# Patient Record
Sex: Female | Born: 1965 | Race: White | Hispanic: No | Marital: Married | Smoking: Current every day smoker
Health system: Southern US, Community
[De-identification: ages and names within clinical notes are randomized; demographics above are authoritative.]

## PROBLEM LIST (undated history)

## (undated) ENCOUNTER — Emergency Department (HOSPITAL_COMMUNITY): Admission: EM | Payer: 59 | Source: Home / Self Care

## (undated) DIAGNOSIS — I10 Essential (primary) hypertension: Secondary | ICD-10-CM

## (undated) DIAGNOSIS — M1990 Unspecified osteoarthritis, unspecified site: Secondary | ICD-10-CM

## (undated) DIAGNOSIS — T7840XA Allergy, unspecified, initial encounter: Secondary | ICD-10-CM

## (undated) HISTORY — PX: ABDOMINAL HYSTERECTOMY: SHX81

## (undated) HISTORY — DX: Essential (primary) hypertension: I10

## (undated) HISTORY — PX: TOTAL ABDOMINAL HYSTERECTOMY: SHX209

## (undated) HISTORY — DX: Allergy, unspecified, initial encounter: T78.40XA

## (undated) HISTORY — PX: CARDIAC CATHETERIZATION: SHX172

## (undated) HISTORY — DX: Unspecified osteoarthritis, unspecified site: M19.90

---

## 1998-09-12 ENCOUNTER — Other Ambulatory Visit: Admission: RE | Admit: 1998-09-12 | Discharge: 1998-09-12 | Payer: Self-pay | Admitting: Gynecology

## 2000-08-04 ENCOUNTER — Encounter: Admission: RE | Admit: 2000-08-04 | Discharge: 2000-08-04 | Payer: Self-pay | Admitting: Internal Medicine

## 2000-08-04 ENCOUNTER — Encounter: Payer: Self-pay | Admitting: Internal Medicine

## 2011-11-10 ENCOUNTER — Other Ambulatory Visit: Payer: Self-pay | Admitting: Physician Assistant

## 2011-11-10 MED ORDER — VENLAFAXINE HCL 75 MG PO TABS: ORAL_TABLET | ORAL | Status: DC

## 2011-12-17 ENCOUNTER — Ambulatory Visit (INDEPENDENT_AMBULATORY_CARE_PROVIDER_SITE_OTHER): Payer: 59 | Admitting: Family Medicine

## 2011-12-17 VITALS — BP 122/78 | HR 75 | Temp 98.5°F | Resp 16 | Ht 63.25 in | Wt 176.0 lb

## 2011-12-17 DIAGNOSIS — F3289 Other specified depressive episodes: Secondary | ICD-10-CM

## 2011-12-17 DIAGNOSIS — F329 Major depressive disorder, single episode, unspecified: Secondary | ICD-10-CM

## 2011-12-17 DIAGNOSIS — F32A Depression, unspecified: Secondary | ICD-10-CM | POA: Insufficient documentation

## 2011-12-17 MED ORDER — VENLAFAXINE HCL 75 MG PO TABS: ORAL_TABLET | ORAL | Status: DC

## 2011-12-17 NOTE — Progress Notes (Signed)
  Subjective:    Patient ID: Rachel Roth, female    DOB: [birth year], 46 y.o.   MRN: [id number]  HPI 46 yo female with depression who needs refill on effexor.  On for several years.  Doing well.  Has tried other meds and didn't do well.    Otherwise doing well.  No other complaints.    Review of Systems Negative except as per HPI     Objective:   Physical Exam  Constitutional: She appears well-developed and well-nourished.  Cardiovascular: Normal rate, regular rhythm, normal heart sounds and intact distal pulses.   No murmur heard. Pulmonary/Chest: Effort normal and breath sounds normal.  Neurological: She is alert.  Skin: Skin is warm and dry.          Assessment & Plan:  Depression - refilled effexor for 6 months.

## 2012-05-28 ENCOUNTER — Ambulatory Visit: Payer: 59

## 2012-05-28 ENCOUNTER — Ambulatory Visit (INDEPENDENT_AMBULATORY_CARE_PROVIDER_SITE_OTHER): Payer: 59 | Admitting: Internal Medicine

## 2012-05-28 VITALS — BP 135/83 | HR 76 | Temp 98.3°F | Resp 16 | Ht 62.58 in | Wt 174.8 lb

## 2012-05-28 DIAGNOSIS — M2550 Pain in unspecified joint: Secondary | ICD-10-CM

## 2012-05-28 DIAGNOSIS — R51 Headache: Secondary | ICD-10-CM

## 2012-05-28 DIAGNOSIS — G4700 Insomnia, unspecified: Secondary | ICD-10-CM

## 2012-05-28 DIAGNOSIS — F419 Anxiety disorder, unspecified: Secondary | ICD-10-CM

## 2012-05-28 DIAGNOSIS — F329 Major depressive disorder, single episode, unspecified: Secondary | ICD-10-CM

## 2012-05-28 DIAGNOSIS — F32A Depression, unspecified: Secondary | ICD-10-CM

## 2012-05-28 DIAGNOSIS — M25551 Pain in right hip: Secondary | ICD-10-CM

## 2012-05-28 DIAGNOSIS — Z6831 Body mass index (BMI) 31.0-31.9, adult: Secondary | ICD-10-CM

## 2012-05-28 DIAGNOSIS — R519 Headache, unspecified: Secondary | ICD-10-CM

## 2012-05-28 DIAGNOSIS — M5412 Radiculopathy, cervical region: Secondary | ICD-10-CM

## 2012-05-28 DIAGNOSIS — M25559 Pain in unspecified hip: Secondary | ICD-10-CM

## 2012-05-28 LAB — POCT CBC
Granulocyte percent: 68.9 %G (ref 37–80)
HCT, POC: 45.6 % (ref 37.7–47.9)
Hemoglobin: 14.6 g/dL (ref 12.2–16.2)
Lymph, poc: 2.6 (ref 0.6–3.4)
MCH, POC: 28 pg (ref 27–31.2)
MCHC: 32 g/dL (ref 31.8–35.4)
MCV: 87.4 fL (ref 80–97)
MID (cbc): 0.6 (ref 0–0.9)
MPV: 7.9 fL (ref 0–99.8)
POC Granulocyte: 7.1 — AB (ref 2–6.9)
POC LYMPH PERCENT: 25.5 %L (ref 10–50)
POC MID %: 5.6 %M (ref 0–12)
Platelet Count, POC: 386 10*3/uL (ref 142–424)
RBC: 5.22 M/uL (ref 4.04–5.48)
RDW, POC: 13.8 %
WBC: 10.3 10*3/uL — AB (ref 4.6–10.2)

## 2012-05-28 LAB — POCT SEDIMENTATION RATE: POCT SED RATE: 17 mm/hr (ref 0–22)

## 2012-05-28 MED ORDER — CYCLOBENZAPRINE HCL 10 MG PO TABS: 10.0000 mg | ORAL_TABLET | Freq: Every day | ORAL | Status: AC

## 2012-05-28 MED ORDER — MELOXICAM 15 MG PO TABS: 15.0000 mg | ORAL_TABLET | Freq: Every day | ORAL | Status: DC

## 2012-05-28 MED ORDER — VENLAFAXINE HCL 75 MG PO TABS: ORAL_TABLET | ORAL | Status: DC

## 2012-05-28 NOTE — Progress Notes (Addendum)
Subjective:    Patient ID: Rachel Roth, female    DOB: [birth year], 46 y.o.   MRN: [id number]  HPIComplaining of a headache over the top of the head that is persistent for the last week/80 is worse while she is at work/no vision changes/no nausea/no dizziness/is terribly stressed at work/management continues to change work status on a daily basis/She has a history of similar headaches in the past  Also complaining of neck pain/hertz with movement/affect sleep if she rolls over/pain radiates into both shoulders and mid back/history of neck pain in the past/x-rays here revealed degenerative disc disease especially around C4-5/this has been worse over the past few weeks/she has pain in the right shoulder and right axillary area as well but no numbness or weakness From time to time she has pain in the right elbow the right wrist She also has pain in her right hip/cannot sleep on that side/hurts to go up stairs/pain radiates into right groin/this has been getting worse over the last 3 months to 6 months/has a history of degenerative changes with spondylolisthesis of the lumbar area with x-rays here one to 2 years ago There is no numbness in her feet and no weakness when walking/hip pain is worse with walking/back pain has been minimal over the past 3 months and not limiting any waY.    Review of Systems  GEN: She is not sleeping/this is been an intermittent problem For years but has been worse over the last 3-6 months/she worries all night/she is more depressed than she has been/she attributes this to workplace stress/has been on Effexor generic 75 mg for a few years/no suicidal ideation/she does not withdraw from activities due to her depression//Husband snores/ Continues to be overweight HEENT-negative Cardiovascular/respiratory-Negative Gastrointestinal-negative Genitourinary-no incontinence/no dysuria frequency or urgency/hysterectomy leaving the right ovary 20 years ago/No GYN followup in  several years/no mammogram despite family history of aunts and cousins with breast cancer/was referred by Dr. Angie Fava in March but never kept the appointment Neurological-no memory loss   Objective:   Physical Exam Filed Vitals:   05/28/12 1529  BP: 135/83  Pulse: 76  Temp: 98.3 F (36.8 C)  Resp: 16  Weight 174 Pupils equal round and to light and accommodation/EOMs conjugate TMs clear/nares clear Oropharynx clear No nodes or thyromegaly Neck has limited range of motion with pain on rotation and needed correction and loss of extension She is tender over the paracervical muscles posteriorly and the trapezii/also tender along the right scapular border The right shoulder has a good range of motion without pain She is tender in the right axilla but no mass/right deltoid and right pectoralis are nontender Pain in the right axilla is worse with shoulder elevation Lungs are clear to auscultation Heart regular without murmur The right hip has pain on external rotation and a decreased external rotation/flexion also uncomfortable Straight leg raise is stable to 90 bilateral Deep tendon reflexes are intact No sensory or motor losses in the lower extremities       [facility] reading (PRIMARY) by  Dr. Lisl Slingerland=Narrowed joint space.   Assessment & Plan:   1. HA (headache)  Comprehensive metabolic panel  2. Pain, joint, hip, right  DG Hip Complete Right, POCT CBC, POCT SEDIMENTATION RATE, Comprehensive metabolic panel  3. Cervical radiculopathy    4. Depression  POCT CBC, Comprehensive metabolic panel, TSH  5. Anxiety    6. Insomnia  TSH  7. BMI 31.0-31.9,adult    8. Arthralgia of multiple joints  POCT CBC, TSH  Effexor increased to 75 mg twice a day Mobic 15 mg daily Call with lab results and plan She will set up mammogram/needs GYN  Results for orders placed in visit on 05/28/12  POCT CBC      Component Value Range   WBC 10.3 (*) 4.6 - 10.2 K/uL   Lymph, poc 2.6  0.6 - 3.4    POC LYMPH PERCENT 25.5  10 - 50 %L   MID (cbc) 0.6  0 - 0.9   POC MID % 5.6  0 - 12 %M   POC Granulocyte 7.1 (*) 2 - 6.9   Granulocyte percent 68.9  37 - 80 %G   RBC 5.22  4.04 - 5.48 M/uL   Hemoglobin 14.6  12.2 - 16.2 g/dL   HCT, POC 16.1  09.6 - 47.9 %   MCV 87.4  80 - 97 fL   MCH, POC 28.0  27 - 31.2 pg   MCHC 32.0  31.8 - 35.4 g/dL   RDW, POC 04.5     Platelet Count, POC 386  142 - 424 K/uL   MPV 7.9  0 - 99.8 fL  POCT SEDIMENTATION RATE      Component Value Range   POCT SED RATE 17  0 - 22 mm/hr  COMPREHENSIVE METABOLIC PANEL      Component Value Range   Sodium 140  135 - 145 mEq/L   Potassium 4.2  3.5 - 5.3 mEq/L   Chloride 104  96 - 112 mEq/L   CO2 27  19 - 32 mEq/L   Glucose, Bld 79  70 - 99 mg/dL   BUN 13  6 - 23 mg/dL   Creat 4.09  8.11 - 9.14 mg/dL   Total Bilirubin 0.3  0.3 - 1.2 mg/dL   Alkaline Phosphatase 90  39 - 117 U/L   AST 13  0 - 37 U/L   ALT 12  0 - 35 U/L   Total Protein 7.0  6.0 - 8.3 g/dL   Albumin 4.8  3.5 - 5.2 g/dL   Calcium 9.6  8.4 - 78.2 mg/dL  TSH      Component Value Range   TSH 1.786  0.350 - 4.500 uIU/mL   Chart review= RA and ANA negative Oct 22 2009 and 2010 T spine Aug 25 2010 shows mild spondylosis, dorsal kyphosis L-spine XR shows spondylolisthesis of L5 on S1 Jul 30 2010 stable since 06-16-09  C-Spine Oct 22 2009 degenerative changes at C4-5 R hip Nov 10 2007 negative EMG Aug 14 2011 normal upper extremities

## 2012-05-29 DIAGNOSIS — M5412 Radiculopathy, cervical region: Secondary | ICD-10-CM | POA: Insufficient documentation

## 2012-05-29 DIAGNOSIS — F419 Anxiety disorder, unspecified: Secondary | ICD-10-CM | POA: Insufficient documentation

## 2012-05-29 DIAGNOSIS — Z6831 Body mass index (BMI) 31.0-31.9, adult: Secondary | ICD-10-CM | POA: Insufficient documentation

## 2012-05-29 DIAGNOSIS — G4700 Insomnia, unspecified: Secondary | ICD-10-CM | POA: Insufficient documentation

## 2012-05-29 LAB — COMPREHENSIVE METABOLIC PANEL
ALT: 12 U/L (ref 0–35)
AST: 13 U/L (ref 0–37)
Albumin: 4.8 g/dL (ref 3.5–5.2)
Alkaline Phosphatase: 90 U/L (ref 39–117)
BUN: 13 mg/dL (ref 6–23)
CO2: 27 mEq/L (ref 19–32)
Calcium: 9.6 mg/dL (ref 8.4–10.5)
Chloride: 104 mEq/L (ref 96–112)
Creat: 0.71 mg/dL (ref 0.50–1.10)
Glucose, Bld: 79 mg/dL (ref 70–99)
Potassium: 4.2 mEq/L (ref 3.5–5.3)
Sodium: 140 mEq/L (ref 135–145)
Total Bilirubin: 0.3 mg/dL (ref 0.3–1.2)
Total Protein: 7 g/dL (ref 6.0–8.3)

## 2012-05-29 LAB — TSH: TSH: 1.786 u[IU]/mL (ref 0.350–4.500)

## 2012-05-30 ENCOUNTER — Encounter: Payer: Self-pay | Admitting: Internal Medicine

## 2012-06-17 ENCOUNTER — Other Ambulatory Visit: Payer: Self-pay | Admitting: Family Medicine

## 2012-07-09 ENCOUNTER — Ambulatory Visit (INDEPENDENT_AMBULATORY_CARE_PROVIDER_SITE_OTHER): Payer: 59 | Admitting: Internal Medicine

## 2012-07-09 VITALS — BP 138/84 | HR 82 | Temp 98.4°F | Resp 18 | Ht 62.5 in | Wt 177.0 lb

## 2012-07-09 DIAGNOSIS — M5412 Radiculopathy, cervical region: Secondary | ICD-10-CM

## 2012-07-09 DIAGNOSIS — M25559 Pain in unspecified hip: Secondary | ICD-10-CM

## 2012-07-09 DIAGNOSIS — R51 Headache: Secondary | ICD-10-CM

## 2012-07-09 DIAGNOSIS — M545 Low back pain, unspecified: Secondary | ICD-10-CM | POA: Insufficient documentation

## 2012-07-09 DIAGNOSIS — M542 Cervicalgia: Secondary | ICD-10-CM

## 2012-07-09 MED ORDER — MELOXICAM 15 MG PO TABS: 15.0000 mg | ORAL_TABLET | Freq: Every day | ORAL | Status: DC

## 2012-07-09 MED ORDER — VENLAFAXINE HCL ER 150 MG PO CP24: 150.0000 mg | ORAL_CAPSULE | Freq: Every day | ORAL | Status: DC

## 2012-07-09 NOTE — Progress Notes (Signed)
  Subjective:    Patient ID: Rachel Roth, female    DOB: 1966/04/07, 47 y.o.   MRN: [id number]  HPIhip/ neck better w/ meds but now recurred See  Last OV Her headaches are better And now occur when her work is stressful. Her hip affects both activity and sleep. X-rays one month ago were consistent with osteoarthritis bilaterally, though mild. Her left hip does not hurt. Her neck is worse with stress.C-spine x-rays showed degenerative changes at C4-5 in 2011. She has complained of radicular symptoms but had a normal EMG in November 2012. Her depression and anxiety are related to stress at work and also stress with her husband who just had pulmonary embolism. Increasing her Effexor has helped but she is having trouble with the 75 mg tablets which are hard to swallow. In the past she has had negative rheumatological studies. She's got spondylitis listhesis of L5 on S1 since 2010 which has been stable and is currently asymptomatic.    2 jobs at same place 1/2 time standing--11to7:30daily Review of Systems Presently unable to lose weight/no time to exercise with her current work schedule No fever or night sweats Other systems stable    Objective:   Physical Exam Blood pressure 138/84 Weight 177 pounds Straight leg raise is intact 90 bilaterally The right hip has restricted external rotation with pain compared to the left The neck range of motion is limited by stiffness No sensory or motor losses are noted in the extremities Mood is stable-she is not in despair nor actively depressed    Assessment & Plan:   Problem #1 hip osteoarthritis with pain Problem #2 spondylolisthesis45-currently asymptomatic Problem #3 cervical spondylosis/neck pain She is given exercises for all 3 areas to do on a daily basis/she does not have time for an active PT referral/Mobic has been restarted  Problem #4 BMI 31 Weight loss was stressed as part of treating her above symptoms  Problem #5  depression with anxiety Her meds were changed to Effexor 150 SR as the capsule  2 followup in one to 3 months

## 2013-01-01 ENCOUNTER — Other Ambulatory Visit: Payer: Self-pay | Admitting: Internal Medicine

## 2013-01-04 ENCOUNTER — Other Ambulatory Visit: Payer: Self-pay | Admitting: Internal Medicine

## 2013-01-08 ENCOUNTER — Ambulatory Visit (INDEPENDENT_AMBULATORY_CARE_PROVIDER_SITE_OTHER): Payer: 59 | Admitting: Family Medicine

## 2013-01-08 VITALS — BP 130/77 | HR 84 | Temp 98.6°F | Resp 16 | Ht 63.0 in | Wt 168.0 lb

## 2013-01-08 DIAGNOSIS — N9089 Other specified noninflammatory disorders of vulva and perineum: Secondary | ICD-10-CM

## 2013-01-08 DIAGNOSIS — F32A Depression, unspecified: Secondary | ICD-10-CM

## 2013-01-08 DIAGNOSIS — F3289 Other specified depressive episodes: Secondary | ICD-10-CM

## 2013-01-08 DIAGNOSIS — F329 Major depressive disorder, single episode, unspecified: Secondary | ICD-10-CM

## 2013-01-08 DIAGNOSIS — B356 Tinea cruris: Secondary | ICD-10-CM

## 2013-01-08 DIAGNOSIS — Z0000 Encounter for general adult medical examination without abnormal findings: Secondary | ICD-10-CM

## 2013-01-08 MED ORDER — VENLAFAXINE HCL ER 150 MG PO CP24: 150.0000 mg | ORAL_CAPSULE | Freq: Every day | ORAL | Status: DC

## 2013-01-08 MED ORDER — KETOCONAZOLE 2 % EX CREA: TOPICAL_CREAM | Freq: Every day | CUTANEOUS | Status: DC

## 2013-01-08 NOTE — Progress Notes (Signed)
47 yo married woman, post hysterectomy, who desires pap (last one being 6 years ago).  She has itching and tender perineal rash as well. Mammogram in January  2 children:  29, 22.  3 grandchildren.  Also needs refill on effexor xr 150 daily  Work:  Recently left [employer] because of stress after 10 years of loyal service.  Objective:  Tearful woman in NAD Pelvic:  External labial tag, tinea cruris between labia, normal vaginal exam (post hyst), normal sized right ovary. Abdomen:  Normal  Assessement:  Tinea cruris  Plan:   Gyn referral for skin tag Pap I nizoral cream  Depression - Plan: venlafaxine XR (EFFEXOR-XR) 150 MG 24 hr capsule  Routine general medical examination at a health care facility - Plan: Pap IG (Image Guided)  Tinea cruris  Skin tag of labia - Plan: Ambulatory referral to Gynecology

## 2013-04-12 ENCOUNTER — Other Ambulatory Visit: Payer: Self-pay | Admitting: Internal Medicine

## 2013-06-28 ENCOUNTER — Ambulatory Visit (INDEPENDENT_AMBULATORY_CARE_PROVIDER_SITE_OTHER): Payer: 59 | Admitting: *Deleted

## 2013-06-28 DIAGNOSIS — Z23 Encounter for immunization: Secondary | ICD-10-CM

## 2013-11-01 ENCOUNTER — Encounter: Payer: Self-pay | Admitting: Internal Medicine

## 2013-11-01 ENCOUNTER — Ambulatory Visit (INDEPENDENT_AMBULATORY_CARE_PROVIDER_SITE_OTHER): Payer: 59 | Admitting: Internal Medicine

## 2013-11-01 VITALS — BP 152/90 | HR 81 | Temp 99.1°F | Resp 16 | Ht 62.0 in | Wt 170.0 lb

## 2013-11-01 DIAGNOSIS — R61 Generalized hyperhidrosis: Secondary | ICD-10-CM

## 2013-11-01 DIAGNOSIS — F341 Dysthymic disorder: Secondary | ICD-10-CM

## 2013-11-01 DIAGNOSIS — R638 Other symptoms and signs concerning food and fluid intake: Secondary | ICD-10-CM

## 2013-11-01 DIAGNOSIS — F329 Major depressive disorder, single episode, unspecified: Secondary | ICD-10-CM

## 2013-11-01 DIAGNOSIS — M25559 Pain in unspecified hip: Secondary | ICD-10-CM

## 2013-11-01 DIAGNOSIS — R030 Elevated blood-pressure reading, without diagnosis of hypertension: Secondary | ICD-10-CM

## 2013-11-01 DIAGNOSIS — R351 Nocturia: Secondary | ICD-10-CM

## 2013-11-01 DIAGNOSIS — F32A Depression, unspecified: Secondary | ICD-10-CM

## 2013-11-01 DIAGNOSIS — R350 Frequency of micturition: Secondary | ICD-10-CM

## 2013-11-01 DIAGNOSIS — IMO0001 Reserved for inherently not codable concepts without codable children: Secondary | ICD-10-CM

## 2013-11-01 DIAGNOSIS — Z6831 Body mass index (BMI) 31.0-31.9, adult: Secondary | ICD-10-CM

## 2013-11-01 LAB — POCT URINALYSIS DIPSTICK
Bilirubin, UA: NEGATIVE
Glucose, UA: NEGATIVE
Ketones, UA: NEGATIVE
Leukocytes, UA: NEGATIVE
Nitrite, UA: NEGATIVE
Protein, UA: NEGATIVE
Spec Grav, UA: 1.025
Urobilinogen, UA: 0.2
pH, UA: 6.5

## 2013-11-01 LAB — CBC WITH DIFFERENTIAL/PLATELET
Basophils Absolute: 0 10*3/uL (ref 0.0–0.1)
Basophils Relative: 0 % (ref 0–1)
Eosinophils Absolute: 0.3 10*3/uL (ref 0.0–0.7)
Eosinophils Relative: 3 % (ref 0–5)
HCT: 39.2 % (ref 36.0–46.0)
Hemoglobin: 13.5 g/dL (ref 12.0–15.0)
Lymphocytes Relative: 23 % (ref 12–46)
Lymphs Abs: 2.1 10*3/uL (ref 0.7–4.0)
MCH: 28.1 pg (ref 26.0–34.0)
MCHC: 34.4 g/dL (ref 30.0–36.0)
MCV: 81.5 fL (ref 78.0–100.0)
Monocytes Absolute: 0.6 10*3/uL (ref 0.1–1.0)
Monocytes Relative: 7 % (ref 3–12)
Neutro Abs: 6 10*3/uL (ref 1.7–7.7)
Neutrophils Relative %: 67 % (ref 43–77)
Platelets: 329 10*3/uL (ref 150–400)
RBC: 4.81 MIL/uL (ref 3.87–5.11)
RDW: 14.6 % (ref 11.5–15.5)
WBC: 8.9 10*3/uL (ref 4.0–10.5)

## 2013-11-01 LAB — COMPREHENSIVE METABOLIC PANEL
ALT: 14 U/L (ref 0–35)
AST: 14 U/L (ref 0–37)
Albumin: 4.2 g/dL (ref 3.5–5.2)
Alkaline Phosphatase: 116 U/L (ref 39–117)
BUN: 11 mg/dL (ref 6–23)
CO2: 26 mEq/L (ref 19–32)
Calcium: 9.3 mg/dL (ref 8.4–10.5)
Chloride: 103 mEq/L (ref 96–112)
Creat: 0.46 mg/dL — ABNORMAL LOW (ref 0.50–1.10)
Glucose, Bld: 91 mg/dL (ref 70–99)
Potassium: 3.9 mEq/L (ref 3.5–5.3)
Sodium: 138 mEq/L (ref 135–145)
Total Bilirubin: 0.4 mg/dL (ref 0.2–1.2)
Total Protein: 7.1 g/dL (ref 6.0–8.3)

## 2013-11-01 LAB — POCT UA - MICROSCOPIC ONLY
Bacteria, U Microscopic: NEGATIVE
Casts, Ur, LPF, POC: NEGATIVE
Crystals, Ur, HPF, POC: NEGATIVE
Epithelial cells, urine per micros: NEGATIVE
Yeast, UA: NEGATIVE

## 2013-11-01 LAB — POCT GLYCOSYLATED HEMOGLOBIN (HGB A1C): Hemoglobin A1C: 5.2

## 2013-11-01 MED ORDER — CLONAZEPAM 0.5 MG PO TABS: 0.5000 mg | ORAL_TABLET | Freq: Every day | ORAL | Status: DC

## 2013-11-01 MED ORDER — MELOXICAM 15 MG PO TABS: 15.0000 mg | ORAL_TABLET | Freq: Every day | ORAL | Status: DC

## 2013-11-01 MED ORDER — VENLAFAXINE HCL ER 75 MG PO CP24: 75.0000 mg | ORAL_CAPSULE | Freq: Every day | ORAL | Status: DC

## 2013-11-01 NOTE — Patient Instructions (Signed)
Hip Exercises RANGE OF MOTION (ROM) AND STRETCHING EXERCISES  These exercises may help you when beginning to rehabilitate your injury. Doing them too aggressively can worsen your condition. Complete them slowly and gently. Your symptoms may resolve with or without further involvement from your physician, physical therapist or athletic trainer. While completing these exercises, remember:   Restoring tissue flexibility helps normal motion to return to the joints. This allows healthier, less painful movement and activity.  An effective stretch should be held for at least 30 seconds.  A stretch should never be painful. You should only feel a gentle lengthening or release in the stretched tissue. If these stretches worsen your symptoms even when done gently, consult your physician, physical therapist or athletic trainer. STRETCH Hamstrings, Supine   Lie on your back. Loop a belt or towel over the ball of your right / left foot.  Straighten your right / left knee and slowly pull on the belt to raise your leg. Do not allow the right / left knee to bend. Keep your opposite leg flat on the floor.  Raise the leg until you feel a gentle stretch behind your right / left knee or thigh. Hold this position for _____5_-10____ seconds. Repeat ________3__ times. Complete this stretch _____2_____ times per day.  STRETCH - Hip Rotators   Lie on your back on a firm surface. Grasp your right / left knee with your right / left hand and your ankle with your opposite hand.  Keeping your hips and shoulders firmly planted, gently pull your right / left knee and rotate your lower leg toward your opposite shoulder until you feel a stretch in your buttocks.  Hold this stretch for ______5-10____ seconds. Repeat this stretch __________ times. Complete this stretch __________ times per day. STRETCH - Hamstrings/Adductors, V-Sit   Sit on the floor with your legs extended in a large "V," keeping your knees straight.  With  your head and chest upright, bend at your waist reaching for your right foot to stretch your left adductors.  You should feel a stretch in your left inner thigh. Hold for ________10__ seconds.  Return to the upright position to relax your leg muscles.  Continuing to keep your chest upright, bend straight forward at your waist to stretch your hamstrings.  You should feel a stretch behind both of your thighs and/or knees. Hold for ___10_______ seconds.  Return to the upright position to relax your leg muscles.  Repeat steps 2 through 4 for opposite leg. Repeat ________3__ times. Complete this exercise _______2___ times per day.  STRETCHING - Hip Flexors, Lunge  Half kneel with your right / left knee on the floor and your opposite knee bent and directly over your ankle.  Keep good posture with your head over your shoulders. Tighten your buttocks to point your tailbone downward; this will prevent your back from arching too much.  You should feel a gentle stretch in the front of your thigh and/or hip. If you do not feel any resistance, slightly slide your opposite foot forward and then slowly lunge forward so your knee once again lines up over your ankle. Be sure your tailbone remains pointed downward.  Hold this stretch for __________ seconds. Repeat __________ times. Complete this stretch __________ times per day. STRENGTHENING EXERCISES These exercises may help you when beginning to rehabilitate your injury. They may resolve your symptoms with or without further involvement from your physician, physical therapist or athletic trainer. While completing these exercises, remember:   Muscles can  gain both the endurance and the strength needed for everyday activities through controlled exercises.  Complete these exercises as instructed by your physician, physical therapist or athletic trainer. Progress the resistance and repetitions only as guided.  You may experience muscle soreness or  fatigue, but the pain or discomfort you are trying to eliminate should never worsen during these exercises. If this pain does worsen, stop and make certain you are following the directions exactly. If the pain is still present after adjustments, discontinue the exercise until you can discuss the trouble with your clinician. STRENGTH - Hip Extensors, Bridge   Lie on your back on a firm surface. Bend your knees and place your feet flat on the floor.  Tighten your buttocks muscles and lift your bottom off the floor until your trunk is level with your thighs. You should feel the muscles in your buttocks and back of your thighs working. If you do not feel these muscles, slide your feet 1-2 inches further away from your buttocks.  Hold this position for ________10__ seconds.  Slowly lower your hips to the starting position and allow your buttock muscles relax completely before beginning the next repetition.  If this exercise is too easy, you may cross your arms over your chest. Repeat ______3____ times. Complete this exercise _____2_____ times per day.  STRENGTH - Hip Abductors, Straight Leg Raises  Be aware of your form throughout the entire exercise so that you exercise the correct muscles. Sloppy form means that you are not strengthening the correct muscles.  Lie on your side so that your head, shoulders, knee and hip line up. You may bend your lower knee to help maintain your balance. Your right / left leg should be on top.  Roll your hips slightly forward, so that your hips are stacked directly over each other and your right / left knee is facing forward.  Lift your top leg up 4-6 inches, leading with your heel. Be sure that your foot does not drift forward or that your knee does not roll toward the ceiling.  Hold this position for __________ seconds. You should feel the muscles in your outer hip lifting (you may not notice this until your leg begins to tire).  Slowly lower your leg to the  starting position. Allow the muscles to fully relax before beginning the next repetition. Repeat __________ times. Complete this exercise __________ times per day.  STRENGTH - Hip Adductors, Straight Leg Raises   Lie on your side so that your head, shoulders, knee and hip line up. You may place your upper foot in front to help maintain your balance. Your right / left leg should be on the bottom.  Roll your hips slightly forward, so that your hips are stacked directly over each other and your right / left knee is facing forward.  Tense the muscles in your inner thigh and lift your bottom leg 4-6 inches. Hold this position for __________ seconds.  Slowly lower your leg to the starting position. Allow the muscles to fully relax before beginning the next repetition. Repeat __________ times. Complete this exercise __________ times per day.  STRENGTH - Quadriceps, Straight Leg Raises  Quality counts! Watch for signs that the quadriceps muscle is working to insure you are strengthening the correct muscles and not "cheating" by substituting with healthier muscles.  Lay on your back with your right / left leg extended and your opposite knee bent.  Tense the muscles in the front of your right / left thigh.  You should see either your knee cap slide up or increased dimpling just above the knee. Your thigh may even quiver.  Tighten these muscles even more and raise your leg 4 to 6 inches off the floor. Hold for right / left seconds.  Keeping these muscles tense, lower your leg.  Relax the muscles slowly and completely in between each repetition. Repeat __________ times. Complete this exercise __________ times per day.  STRENGTH - Hip Abductors, Standing  Tie one end of a rubber exercise band/tubing to a secure surface (table, pole) and tie a loop at the other end.  Place the loop around your right / left ankle. Keeping your ankle with the band directly opposite of the secured end, step away until  there is tension in the tube/band.  Hold onto a chair as needed for balance.  Keeping your back upright, your shoulders over your hips, and your toes pointing forward, lift your right / left leg out to your side. Be sure to lift your leg with your hip muscles. Do not "throw" your leg or tip your body to lift your leg.  Slowly and with control, return to the starting position. Repeat exercise ________3__ times. Complete this exercise ____2______ times per day.  STRENGTH  Quadriceps, Squats  Stand in a door frame so that your feet and knees are in line with the frame.  Use your hands for balance, not support, on the frame.  Slowly lower your weight, bending at the hips and knees. Keep your lower legs upright so that they are parallel with the door frame. Squat only within the range that does not increase your knee pain. Never let your hips drop below your knees.  Slowly return upright, pushing with your legs, not pulling with your hands. Document Released: 10/02/2005 Document Revised: 12/07/2011 Document Reviewed: 12/27/2008 [facility] Patient Information 2014 [place], [place].

## 2013-11-01 NOTE — Progress Notes (Signed)
Subjective:    Patient ID: Rachel Roth, female    DOB: 1965-12-17, 48 y.o.   MRN: [id number]  HPI  Chief Complaint  Patient presents with  . Medication Refill    Venlaflaxin----started 8 months ago by Dr. Carlean Jews--- somewhat improved but still has lots of worry/anxiety on a daily basis   . Insomnia    2 months or more---lots of mind racing---poor quality sleep No snoring or observ apnea but pos hypersomnolence This is beginning to be very distressing   . Polydipsia    2 months and getting up 3 times or more at night//also day and night frequency w/ ? incompl void    -  Hip pain R---melox helps a little/see 2013 xrays arthr bilat    -  Sinus HAs---infec 2 weeks ago//intermittent though not infrequent   S/p hyst leaving L ovary 20 y ago---night sweats/poor sleep--- wonders if menopause  Review of Systems  Constitutional: Negative for fever, activity change, appetite change, fatigue and unexpected weight change.  HENT: Negative for hearing loss.   Eyes: Negative for visual disturbance.  Respiratory: Negative for cough, chest tightness and shortness of breath.   Cardiovascular: Negative for chest pain, palpitations and leg swelling.  Gastrointestinal: Negative for nausea, abdominal pain, diarrhea and constipation.  Endocrine: Negative for cold intolerance, heat intolerance and polyphagia.  Musculoskeletal: Positive for gait problem. Negative for back pain and joint swelling.  Skin: Negative for rash.  Neurological: Negative for dizziness.  Hematological: Does not bruise/bleed easily.  Psychiatric/Behavioral: Negative for hallucinations, confusion, self-injury and decreased concentration.       Objective:   Physical Exam  Nursing note and vitals reviewed. Constitutional: She is oriented to person, place, and time. She appears well-developed and well-nourished. No distress.  HENT:  Head: Normocephalic and atraumatic.  Eyes: Conjunctivae and EOM are normal. Pupils are equal,  round, and reactive to light.  Neck: Normal range of motion. No thyromegaly present.  Cardiovascular: Normal rate, regular rhythm and normal heart sounds.   No murmur heard. Pulmonary/Chest: Effort normal and breath sounds normal. No respiratory distress.  Musculoskeletal: Normal range of motion. She exhibits no edema.  Right hip with loss of range of motion secondary to discomfort hip flexion produces groin pain Nontender over greater trochanter  Lymphadenopathy:    She has no cervical adenopathy.  Neurological: She is alert and oriented to person, place, and time.  Skin: Skin is warm and dry.  Psychiatric: She has a normal mood and affect. Her behavior is normal.  Obviously uncomfortable with her level of worrying and lack of sleep   BP 152/90  Pulse 81  Temp(Src) 99.1 F (37.3 C) (Oral)  Resp 16  Ht 5\' 2"  (1.575 m)  Wt 170 lb (77.111 kg)  BMI 31.09 kg/m2  SpO2 98%        Assessment & Plan:  Urinary frequency - Plan: POCT urinalysis dipstick, POCT UA - Microscopic Only, POCT glycosylated hemoglobin (Hb A1C)  Nocturia - Plan: FSH/LH  Thirst  Blood pressure elevated without history of HTN - Plan: CBC with Differential, Comprehensive metabolic panel  BMI 71.0-62.6,RSWNI - Plan: POCT glycosylated hemoglobin (Hb A1C)  Generalized anxiety with insomnia- Plan: Klonopin at bedtime/call with results/try to convince husband about the need for roll outside the home/she currently takes care of grandchildren 2-3 days a week  Depression - Plan: venlafaxine XR (EFFEXOR-XR) 75 MG 24 hr capsule added to her 150 mg tablet for a total  of 225 mg extended release///to call  in 1-2 months with outcome indecision about refills  Hip pain-osteoarthritis  exercises given/meloxicam/if no improvement 1-2 months we'll get orthopedic consult  Night sweats - Plan: FSH/LH  Meds ordered this encounter  Medications  . venlafaxine XR (EFFEXOR-XR) 75 MG 24 hr capsule    Sig: Take 1 capsule (75 mg  total) by mouth daily. To add to 150xr currently taking    Dispense:  90 capsule    Refill:  0  . meloxicam (MOBIC) 15 MG tablet    Sig: Take 1 tablet (15 mg total) by mouth daily.    Dispense:  90 tablet    Refill:  3  . clonazePAM (KLONOPIN) 0.5 MG tablet    Sig: Take 1 tablet (0.5 mg total) by mouth at bedtime.    Dispense:  30 tablet    Refill:  2

## 2013-11-02 LAB — FSH/LH
FSH: 17.4 m[IU]/mL
LH: 6.5 m[IU]/mL

## 2013-11-02 LAB — TSH: TSH: 1.229 u[IU]/mL (ref 0.350–4.500)

## 2013-11-11 ENCOUNTER — Encounter: Payer: Self-pay | Admitting: Internal Medicine

## 2014-01-08 ENCOUNTER — Other Ambulatory Visit: Payer: Self-pay | Admitting: Family Medicine

## 2014-01-14 ENCOUNTER — Ambulatory Visit (INDEPENDENT_AMBULATORY_CARE_PROVIDER_SITE_OTHER): Payer: 59 | Admitting: Emergency Medicine

## 2014-01-14 VITALS — BP 140/90 | HR 78 | Temp 98.2°F | Resp 16 | Ht 63.0 in | Wt 167.0 lb

## 2014-01-14 DIAGNOSIS — R059 Cough, unspecified: Secondary | ICD-10-CM

## 2014-01-14 DIAGNOSIS — G4700 Insomnia, unspecified: Secondary | ICD-10-CM

## 2014-01-14 DIAGNOSIS — J040 Acute laryngitis: Secondary | ICD-10-CM

## 2014-01-14 DIAGNOSIS — F32A Depression, unspecified: Secondary | ICD-10-CM

## 2014-01-14 DIAGNOSIS — F329 Major depressive disorder, single episode, unspecified: Secondary | ICD-10-CM

## 2014-01-14 DIAGNOSIS — R05 Cough: Secondary | ICD-10-CM

## 2014-01-14 DIAGNOSIS — F3289 Other specified depressive episodes: Secondary | ICD-10-CM

## 2014-01-14 DIAGNOSIS — J40 Bronchitis, not specified as acute or chronic: Secondary | ICD-10-CM

## 2014-01-14 LAB — POCT RAPID STREP A (OFFICE): Rapid Strep A Screen: NEGATIVE

## 2014-01-14 MED ORDER — AZITHROMYCIN 250 MG PO TABS: ORAL_TABLET | ORAL | Status: DC

## 2014-01-14 MED ORDER — VENLAFAXINE HCL ER 150 MG PO CP24: ORAL_CAPSULE | ORAL | Status: DC

## 2014-01-14 MED ORDER — CLONAZEPAM 0.5 MG PO TABS: 0.5000 mg | ORAL_TABLET | Freq: Every day | ORAL | Status: DC

## 2014-01-14 NOTE — Patient Instructions (Signed)
Bronchitis Bronchitis is inflammation of the airways that extend from the windpipe into the lungs (bronchi). The inflammation often causes mucus to develop, which leads to a cough. If the inflammation becomes severe, it may cause shortness of breath. CAUSES  Bronchitis may be caused by:   Viral infections.   Bacteria.   Cigarette smoke.   Allergens, pollutants, and other irritants.  SIGNS AND SYMPTOMS  The most common symptom of bronchitis is a frequent cough that produces mucus. Other symptoms include:  Fever.   Body aches.   Chest congestion.   Chills.   Shortness of breath.   Sore throat.  DIAGNOSIS  Bronchitis is usually diagnosed through a medical history and physical exam. Tests, such as chest X-rays, are sometimes done to rule out other conditions.  TREATMENT  You may need to avoid contact with whatever caused the problem (smoking, for example). Medicines are sometimes needed. These may include:  Antibiotics. These may be prescribed if the condition is caused by bacteria.  Cough suppressants. These may be prescribed for relief of cough symptoms.   Inhaled medicines. These may be prescribed to help open your airways and make it easier for you to breathe.   Steroid medicines. These may be prescribed for those with recurrent (chronic) bronchitis. HOME CARE INSTRUCTIONS  Get plenty of rest.   Drink enough fluids to keep your urine clear or pale yellow (unless you have a medical condition that requires fluid restriction). Increasing fluids may help thin your secretions and will prevent dehydration.   Only take over-the-counter or prescription medicines as directed by your health care provider.  Only take antibiotics as directed. Make sure you finish them even if you start to feel better.  Avoid secondhand smoke, irritating chemicals, and strong fumes. These will make bronchitis worse. If you are a smoker, quit smoking. Consider using nicotine gum or  skin patches to help control withdrawal symptoms. Quitting smoking will help your lungs heal faster.   Put a cool-mist humidifier in your bedroom at night to moisten the air. This may help loosen mucus. Change the water in the humidifier daily. You can also run the hot water in your shower and sit in the bathroom with the door closed for 5 10 minutes.   Follow up with your health care provider as directed.   Wash your hands frequently to avoid catching bronchitis again or spreading an infection to others.  SEEK MEDICAL CARE IF: Your symptoms do not improve after 1 week of treatment.  SEEK IMMEDIATE MEDICAL CARE IF:  Your fever increases.  You have chills.   You have chest pain.   You have worsening shortness of breath.   You have bloody sputum.  You faint.  You have lightheadedness.  You have a severe headache.   You vomit repeatedly. MAKE SURE YOU:   Understand these instructions.  Will watch your condition.  Will get help right away if you are not doing well or get worse. Document Released: 09/14/2005 Document Revised: 07/05/2013 Document Reviewed: 05/09/2013 [facility] Patient Information 2014 [place]. Laryngitis At the top of your windpipe is your voice box. It is the source of your voice. Inside your voice box are 2 bands of muscles called vocal cords. When you breathe, your vocal cords are relaxed and open so that air can get into the lungs. When you decide to say something, these cords come together and vibrate. The sound from these vibrations goes into your throat and comes out through your mouth as sound.  Laryngitis is an inflammation of the vocal cords that causes hoarseness, cough, loss of voice, sore throat, and dry throat. Laryngitis can be temporary (acute) or long-term (chronic). Most cases of acute laryngitis improve with time.Chronic laryngitis lasts for more than 3 weeks. CAUSES Laryngitis can often be related to excessive smoking, talking,  or yelling, as well as inhalation of toxic fumes and allergies. Acute laryngitis is usually caused by a viral infection, vocal strain, measles or mumps, or bacterial infections. Chronic laryngitis is usually caused by vocal cord strain, vocal cord injury, postnasal drip, growths on the vocal cords, or acid reflux. SYMPTOMS   Cough.  Sore throat.  Dry throat. RISK FACTORS  Respiratory infections.  Exposure to irritating substances, such as cigarette smoke, excessive amounts of alcohol, stomach acids, and workplace chemicals.  Voice trauma, such as vocal cord injury from shouting or speaking too loud. DIAGNOSIS  Your cargiver will perform a physical exam. During the physical exam, your caregiver will examine your throat. The most common sign of laryngitis is hoarseness. Laryngoscopy may be necessary to confirm the diagnosis of this condition. This procedure allows your caregiver to look into the larynx. HOME CARE INSTRUCTIONS  Drink enough fluids to keep your urine clear or pale yellow.  Rest until you no longer have symptoms or as directed by your caregiver.  Breathe in moist air.  Take all medicine as directed by your caregiver.  Do not smoke.  Talk as little as possible (this includes whispering).  Write on paper instead of talking until your voice is back to normal.  Follow up with your caregiver if your condition has not improved after 10 days. SEEK MEDICAL CARE IF:   You have trouble breathing.  You cough up blood.  You have persistent fever.  You have increasing pain.  You have difficulty swallowing. MAKE SURE YOU:  Understand these instructions.  Will watch your condition.  Will get help right away if you are not doing well or get worse. Document Released: 09/14/2005 Document Revised: 12/07/2011 Document Reviewed: 11/20/2010 [facility] Patient Information 2014 [place], [place].

## 2014-01-14 NOTE — Progress Notes (Addendum)
   Subjective:  This chart was scribed for Darlyne Russian, MD by Ludger Nutting, ED Scribe. This patient was seen in room 14 and the patient's care was started 9:22 AM.    Patient ID: Rachel Roth, female    DOB: 1966/09/19, 48 y.o.   MRN: [id number]  HPI HPI Comments: Rachel Roth is a 48 y.o. female who presents to [facility] complaining of 1 day of gradual onset, gradually worsening, constant sore throat. She has associated cough that is productive of yellow sputum, chest congestion, and a hoarse voice. Patient states she had increased activity outside yesterday. She reports using Zyrtec last night with mild relief. She denies fever. She is not a current smoker.   Review of Systems  Constitutional: Negative for fever.  HENT: Positive for sore throat and voice change (hoarse). Negative for trouble swallowing.   Respiratory: Positive for cough. Negative for shortness of breath.        Objective:   Physical Exam  Vitals reviewed.  CONSTITUTIONAL: Well developed/well nourished HEAD: Normocephalic/atraumatic EYES: EOMI/PERRL ENMT: Mucous membranes moist, posterior oropharynx is mildly erythematous. Bilateral TM's are normal, nose is normal NECK: supple no meningeal signs SPINE:entire spine nontender CV: S1/S2 noted, no murmurs/rubs/gallops noted LUNGS: Lungs are clear to auscultation bilaterally, no apparent distress ABDOMEN: soft, nontender, no rebound or guarding GU:no cva tenderness NEURO: Pt is awake/alert, moves all extremitiesx4 EXTREMITIES: pulses normal, full ROM SKIN: warm, color normal PSYCH: no abnormalities of mood noted  Results for orders placed in visit on 01/14/14  POCT RAPID STREP A (OFFICE)      Result Value Ref Range   Rapid Strep A Screen Negative  Negative   Meds ordered this encounter  Medications  . venlafaxine XR (EFFEXOR-XR) 150 MG 24 hr capsule    Sig: TAKE ONE CAPSULE BY MOUTH DAILY    Dispense:  90 capsule    Refill:  3  . clonazePAM (KLONOPIN)  0.5 MG tablet    Sig: Take 1 tablet (0.5 mg total) by mouth at bedtime.    Dispense:  30 tablet    Refill:  3  . azithromycin (ZITHROMAX) 250 MG tablet    Sig: Take 2 tabs PO x 1 dose, then 1 tab PO QD x 4 days    Dispense:  6 tablet    Refill:  0       Assessment & Plan:  Sure whether this is allergy related or whether she is developed a respiratory infection with secondary bronchitis. We'll treat with a Z-Pak continue Zyrtec-D. Medications were refilled for depression and insomnia. Struck this was done I suspect this will be negative.  I personally performed the services described in this documentation, which was scribed in my presence. The recorded information has been reviewed and is accurate.

## 2014-01-15 ENCOUNTER — Telehealth: Payer: Self-pay

## 2014-01-15 NOTE — Telephone Encounter (Signed)
Patient called stated she pick up her prescription and Dr. Laney Pastor refilled old dosage Venlafaxine 150 mg and she need the new dosage of Venlafaxine 225 mg. Dr. Laney Pastor up the dosage. [phone] [facility] in [place]

## 2014-01-15 NOTE — Telephone Encounter (Signed)
Sure this isn't dr Everlene Farrier????

## 2014-01-16 LAB — CULTURE, GROUP A STREP: Organism ID, Bacteria: NORMAL

## 2014-01-16 NOTE — Telephone Encounter (Signed)
Please call the pharmacy and see. I believe she has a prescription for Effexor 150 but was also given an increased dose of Effexor 75 extended release to take along with her  150 mg dosage. If this is correct. And we can call in a prescription for Effexor 75 mg extended release to take along with her Effexor 150 for a total dose of 225

## 2014-01-16 NOTE — Telephone Encounter (Signed)
LM for pt to rtn call- Called pharmacy- closed for lunch will try later.

## 2014-01-17 MED ORDER — VENLAFAXINE HCL ER 75 MG PO CP24: 75.0000 mg | ORAL_CAPSULE | Freq: Every day | ORAL | Status: DC

## 2014-01-17 NOTE — Telephone Encounter (Signed)
Patient notified and voiced understanding.

## 2014-01-17 NOTE — Telephone Encounter (Signed)
Pt needed a refill on the Effexor 75mg . I have called this into the pharmacy per the OV and this note.

## 2014-01-31 ENCOUNTER — Telehealth: Payer: Self-pay | Admitting: Radiology

## 2014-01-31 NOTE — Telephone Encounter (Signed)
Call pharmacy, advise to change Rx to BID and go ahead and fill this for her. She should follow up regarding this medication, after she has tried the bid dosing.

## 2014-02-01 NOTE — Telephone Encounter (Signed)
Left message for pharmacy to advise.

## 2014-03-09 ENCOUNTER — Ambulatory Visit (INDEPENDENT_AMBULATORY_CARE_PROVIDER_SITE_OTHER): Payer: 59 | Admitting: Internal Medicine

## 2014-03-09 VITALS — BP 124/80 | HR 85 | Temp 98.7°F | Resp 16 | Ht 63.0 in | Wt 164.2 lb

## 2014-03-09 DIAGNOSIS — F32A Depression, unspecified: Secondary | ICD-10-CM

## 2014-03-09 DIAGNOSIS — F329 Major depressive disorder, single episode, unspecified: Secondary | ICD-10-CM

## 2014-03-09 DIAGNOSIS — F419 Anxiety disorder, unspecified: Secondary | ICD-10-CM

## 2014-03-09 DIAGNOSIS — F411 Generalized anxiety disorder: Secondary | ICD-10-CM

## 2014-03-09 DIAGNOSIS — F3289 Other specified depressive episodes: Secondary | ICD-10-CM

## 2014-03-09 DIAGNOSIS — G4700 Insomnia, unspecified: Secondary | ICD-10-CM

## 2014-03-09 DIAGNOSIS — M25519 Pain in unspecified shoulder: Secondary | ICD-10-CM

## 2014-03-09 MED ORDER — VENLAFAXINE HCL ER 75 MG PO CP24: 75.0000 mg | ORAL_CAPSULE | Freq: Every day | ORAL | Status: DC

## 2014-03-09 MED ORDER — VENLAFAXINE HCL ER 150 MG PO CP24: ORAL_CAPSULE | ORAL | Status: DC

## 2014-03-09 MED ORDER — CYCLOBENZAPRINE HCL 10 MG PO TABS: 10.0000 mg | ORAL_TABLET | Freq: Every day | ORAL | Status: DC

## 2014-03-09 NOTE — Progress Notes (Signed)
This chart was scribed for Tami Lin, MD by Eston Mould, ED Scribe. This patient was seen in room Room/bed 3 and the patient's care was started at 8:40 AM. Subjective:    Patient ID: [name], female    DOB: 1966-08-30, 48 y.o.   MRN: [id number] Chief Complaint  Patient presents with  . Medication Refill    venlafaxine 150mg  and the 75mg    HPI JOSCELYNE RENVILLE is a 48 y.o. female with a hx of insomnia and depression who presents to the [facility] medication refill. Pt states the increase of dosage in Effexor has improved her depression. She denies having any relief of insomnia with Klonopin; she generally takes Klonopin at bedtime and has a tendency to read or has a song being repeated in her head throughout the night which in turn causes her to stay up at night. She took a Benadryl 2 nights ago and states she was unable to get adequate sleep with Benadryl. Pt states her husband still does not want her to work.  Pt c/o R collar bone pain that she describes as aching; she has been actively involved outdoors, now compared to normal.  Patient Active Problem List   Diagnosis Date Noted  . Blood pressure elevated without history of HTN 11/01/2013  . Lower back pain 07/09/2012  . Hip pain 07/09/2012  . Cervical radiculopathy 05/29/2012  . Anxiety 05/29/2012  . Insomnia 05/29/2012  . BMI 31.0-31.9,adult 05/29/2012  . Depression 12/17/2011   Current outpatient prescriptions:meloxicam (MOBIC) 15 MG tablet, Take 1 tablet (15 mg total) by mouth daily., Disp: 90 tablet, Rfl: 3;  venlafaxine XR (EFFEXOR XR) 75 MG 24 hr capsule, Take 1 capsule (75 mg total) by mouth daily with breakfast., Disp: 90 capsule, Rfl: 3;  venlafaxine XR (EFFEXOR-XR) 150 MG 24 hr capsule, TAKE ONE CAPSULE BY MOUTH DAILY, Disp: 90 capsule, Rfl: 3  Review of Systems  Psychiatric/Behavioral: Positive for sleep disturbance.   Objective:   Physical Exam  Nursing note and vitals  reviewed. Constitutional: She is oriented to person, place, and time. She appears well-developed and well-nourished. No distress.  HENT:  Head: Normocephalic and atraumatic.  Eyes: Conjunctivae are normal. Pupils are equal, round, and reactive to light.  Neck: Normal range of motion. Neck supple.  Cardiovascular: Normal rate.   Pulmonary/Chest: Effort normal.  Abdominal: Bowel sounds are normal.  Musculoskeletal: Normal range of motion.  R shoulder has a decreased ROM due to discomfort with lost of abduction. Tender to palpation to the [facility] joint without swelling or redness.  Neurological: She is alert and oriented to person, place, and time.  Skin: Skin is warm. She is not diaphoretic.  Psychiatric: She has a normal mood and affect. Her behavior is normal.   Triage Vitals:BP 124/80  Pulse 85  Temp(Src) 98.7 F (37.1 C) (Oral)  Resp 16  Ht 5\' 3"  (1.6 m)  Wt 164 lb 3.2 oz (74.481 kg)  BMI 29.09 kg/m2 Assessment & Plan:    I have completed the patient encounter in its entirety as documented by the scribe, with editing by me where necessary. Alejos Reinhardt P. Laney Pastor, M.D.  Anxiety Depression  Continue Effexor 225 mg since this has been successful  Insomnia--- this has been terribly annoying and has not responded to benzodiazepines  Trial of Flexeril-call if unsuccessful  Pain in joint, shoulder region  Exercises given Meds ordered this encounter  Medications  . cyclobenzaprine (FLEXERIL) 10 MG tablet    Sig: Take 1 tablet (10 mg total)  by mouth at bedtime.    Dispense:  30 tablet    Refill:  6  . venlafaxine XR (EFFEXOR-XR) 150 MG 24 hr capsule    Sig: TAKE ONE CAPSULE BY MOUTH DAILY    Dispense:  90 capsule    Refill:  3  . venlafaxine XR (EFFEXOR XR) 75 MG 24 hr capsule    Sig: Take 1 capsule (75 mg total) by mouth daily with breakfast.    Dispense:  90 capsule    Refill:  3   6 months

## 2014-05-03 ENCOUNTER — Other Ambulatory Visit: Payer: Self-pay

## 2014-07-25 ENCOUNTER — Encounter: Payer: Self-pay | Admitting: Internal Medicine

## 2014-07-25 ENCOUNTER — Ambulatory Visit (INDEPENDENT_AMBULATORY_CARE_PROVIDER_SITE_OTHER): Payer: 59 | Admitting: Internal Medicine

## 2014-07-25 VITALS — BP 143/81 | HR 77 | Temp 98.5°F | Resp 16 | Ht 63.0 in | Wt 168.0 lb

## 2014-07-25 DIAGNOSIS — M5412 Radiculopathy, cervical region: Secondary | ICD-10-CM

## 2014-07-25 DIAGNOSIS — Z23 Encounter for immunization: Secondary | ICD-10-CM

## 2014-07-25 DIAGNOSIS — Z6831 Body mass index (BMI) 31.0-31.9, adult: Secondary | ICD-10-CM

## 2014-07-25 DIAGNOSIS — F32A Depression, unspecified: Secondary | ICD-10-CM

## 2014-07-25 DIAGNOSIS — M545 Low back pain: Secondary | ICD-10-CM

## 2014-07-25 DIAGNOSIS — G2581 Restless legs syndrome: Secondary | ICD-10-CM

## 2014-07-25 DIAGNOSIS — F329 Major depressive disorder, single episode, unspecified: Secondary | ICD-10-CM

## 2014-07-25 DIAGNOSIS — M25511 Pain in right shoulder: Secondary | ICD-10-CM

## 2014-07-25 DIAGNOSIS — F419 Anxiety disorder, unspecified: Secondary | ICD-10-CM

## 2014-07-25 DIAGNOSIS — K1370 Unspecified lesions of oral mucosa: Secondary | ICD-10-CM

## 2014-07-25 DIAGNOSIS — M791 Myalgia, unspecified site: Secondary | ICD-10-CM

## 2014-07-25 DIAGNOSIS — M2550 Pain in unspecified joint: Secondary | ICD-10-CM

## 2014-07-25 DIAGNOSIS — G4700 Insomnia, unspecified: Secondary | ICD-10-CM

## 2014-07-25 DIAGNOSIS — M25551 Pain in right hip: Secondary | ICD-10-CM

## 2014-07-25 LAB — CBC WITH DIFFERENTIAL/PLATELET
Basophils Absolute: 0 10*3/uL (ref 0.0–0.1)
Basophils Relative: 0 % (ref 0–1)
Eosinophils Absolute: 0.2 10*3/uL (ref 0.0–0.7)
Eosinophils Relative: 3 % (ref 0–5)
HCT: 39.3 % (ref 36.0–46.0)
Hemoglobin: 13.5 g/dL (ref 12.0–15.0)
Lymphocytes Relative: 24 % (ref 12–46)
Lymphs Abs: 1.9 10*3/uL (ref 0.7–4.0)
MCH: 27.7 pg (ref 26.0–34.0)
MCHC: 34.4 g/dL (ref 30.0–36.0)
MCV: 80.5 fL (ref 78.0–100.0)
Monocytes Absolute: 0.5 10*3/uL (ref 0.1–1.0)
Monocytes Relative: 7 % (ref 3–12)
Neutro Abs: 5.1 10*3/uL (ref 1.7–7.7)
Neutrophils Relative %: 66 % (ref 43–77)
Platelets: 338 10*3/uL (ref 150–400)
RBC: 4.88 MIL/uL (ref 3.87–5.11)
RDW: 13.8 % (ref 11.5–15.5)
WBC: 7.8 10*3/uL (ref 4.0–10.5)

## 2014-07-25 LAB — RHEUMATOID FACTOR: Rhuematoid fact SerPl-aCnc: <10 IU/mL (ref <=14)

## 2014-07-25 MED ORDER — GABAPENTIN 300 MG PO CAPS: 300.0000 mg | ORAL_CAPSULE | Freq: Three times a day (TID) | ORAL | Status: DC

## 2014-07-25 NOTE — Patient Instructions (Signed)
Influenza Vaccine (Flu Vaccine, Inactivated or Recombinant) 2014-2015: What You Need to Know 1. Why get vaccinated? Influenza ("flu") is a contagious disease that spreads around the [place] every winter, usually between October and May. Flu is caused by influenza viruses, and is spread mainly by coughing, sneezing, and close contact. Anyone can get flu, but the risk of getting flu is highest among children. Symptoms come on suddenly and may last several days. They can include:  fever/chills  sore throat  muscle aches  fatigue  cough  headache  runny or stuffy nose Flu can make some people much sicker than others. These people include young children, people 65 and older, pregnant women, and people with certain health conditions-such as heart, lung or kidney disease, nervous system disorders, or a weakened immune system. Flu vaccination is especially important for these people, and anyone in close contact with them. Flu can also lead to pneumonia, and make existing medical conditions worse. It can cause diarrhea and seizures in children. Each year thousands of people in the [place] die from flu, and many more are hospitalized. Flu vaccine is the best protection against flu and its complications. Flu vaccine also helps prevent spreading flu from person to person. 2. Inactivated and recombinant flu vaccines You are getting an injectable flu vaccine, which is either an "inactivated" or "recombinant" vaccine. These vaccines do not contain any live influenza virus. They are given by injection with a needle, and often called the "flu shot."  A different live, attenuated (weakened) influenza vaccine is sprayed into the nostrils. This vaccine is described in a separate Vaccine Information Statement. Flu vaccination is recommended every year. Some children 6 months through 8 years of age might need two doses during one year. Flu viruses are always changing. Each year's flu vaccine is made  to protect against 3 or 4 viruses that are likely to cause disease that year. Flu vaccine cannot prevent all cases of flu, but it is the best defense against the disease.  It takes about 2 weeks for protection to develop after the vaccination, and protection lasts several months to a year. Some illnesses that are not caused by influenza virus are often mistaken for flu. Flu vaccine will not prevent these illnesses. It can only prevent influenza. Some inactivated flu vaccine contains a very small amount of a mercury-based preservative called thimerosal. Studies have shown that thimerosal in vaccines is not harmful, but flu vaccines that do not contain a preservative are available. 3. Some people should not get this vaccine Tell the person who gives you the vaccine:  If you have any severe, life-threatening allergies. If you ever had a life-threatening allergic reaction after a dose of flu vaccine, or have a severe allergy to any part of this vaccine, including (for example) an allergy to gelatin, antibiotics, or eggs, you may be advised not to get vaccinated. Most, but not all, types of flu vaccine contain a small amount of egg protein.  If you ever had Guillain-Barr Syndrome (a severe paralyzing illness, also called GBS). Some people with a history of GBS should not get this vaccine. This should be discussed with your doctor.  If you are not feeling well. It is usually okay to get flu vaccine when you have a mild illness, but you might be advised to wait until you feel better. You should come back when you are better. 4. Risks of a vaccine reaction With a vaccine, like any medicine, there is a chance of side   effects. These are usually mild and go away on their own. Problems that could happen after any vaccine:  Brief fainting spells can happen after any medical procedure, including vaccination. Sitting or lying down for about 15 minutes can help prevent fainting, and injuries caused by a fall. Tell  your doctor if you feel dizzy, or have vision changes or ringing in the ears.  Severe shoulder pain and reduced range of motion in the arm where a shot was given can happen, very rarely, after a vaccination.  Severe allergic reactions from a vaccine are very rare, estimated at less than 1 in a million doses. If one were to occur, it would usually be within a few minutes to a few hours after the vaccination. Mild problems following inactivated flu vaccine:  soreness, redness, or swelling where the shot was given  hoarseness  sore, red or itchy eyes  cough  fever  aches  headache  itching  fatigue If these problems occur, they usually begin soon after the shot and last 1 or 2 days. Moderate problems following inactivated flu vaccine:  Young children who get inactivated flu vaccine and pneumococcal vaccine (PCV13) at the same time may be at increased risk for seizures caused by fever. Ask your doctor for more information. Tell your doctor if a child who is getting flu vaccine has ever had a seizure. Inactivated flu vaccine does not contain live flu virus, so you cannot get the flu from this vaccine. As with any medicine, there is a very remote chance of a vaccine causing a serious injury or death. The safety of vaccines is always being monitored. For more information, visit: www.cdc.gov/vaccinesafety/ 5. What if there is a serious reaction? What should I look for?  Look for anything that concerns you, such as signs of a severe allergic reaction, very high fever, or behavior changes. Signs of a severe allergic reaction can include hives, swelling of the face and throat, difficulty breathing, a fast heartbeat, dizziness, and weakness. These would start a few minutes to a few hours after the vaccination. What should I do?  If you think it is a severe allergic reaction or other emergency that can't wait, call 9-1-1 and get the person to the nearest hospital. Otherwise, call your  doctor.  Afterward, the reaction should be reported to the Vaccine Adverse Event Reporting System (VAERS). Your doctor should file this report, or you can do it yourself through the VAERS web site at www.vaers.hhs.gov, or by calling 1-800-822-7967. VAERS does not give medical advice. 6. The National Vaccine Injury Compensation Program The National Vaccine Injury Compensation Program (VICP) is a federal program that was created to compensate people who may have been injured by certain vaccines. Persons who believe they may have been injured by a vaccine can learn about the program and about filing a claim by calling 1-800-338-2382 or visiting the VICP website at www.hrsa.gov/vaccinecompensation. There is a time limit to file a claim for compensation. 7. How can I learn more?  Ask your health care provider.  Call your local or state health department.  Contact the Centers for Disease Control and Prevention (CDC):  Call 1-800-232-4636 (1-800-CDC-INFO) or  Visit CDC's website at www.cdc.gov/flu CDC Vaccine Information Statement (Interim) Inactivated Influenza Vaccine (05/16/2013) Document Released: 07/09/2006 Document Revised: 01/29/2014 Document Reviewed: 09/01/2013 [employer] Patient Information 2015 [employer]. This information is not intended to replace advice given to you by your health care provider. Make sure you discuss any questions you have with your health   care provider.  

## 2014-07-25 NOTE — Progress Notes (Addendum)
Subjective:    Patient ID: Rachel Roth, female    DOB: 07/03/1966, 47 y.o.   MRN: [id number]  Mouth Lesions  Associated symptoms include mouth sores.    Rachel Roth is a 48 y.o. female presenting for restless legs and mouth sore.   Restless legs - ongoing since the summer. Symptoms occur daily, worse at night, feels like she cannot get comfortable, tightness, sometimes tingling, keeps needing to stretch, affects her sleep and starts to get irritable. Has tried OTC sleep aid, flexeril, iron supplement, hot baths, with minimal to no relief. Associated symptoms include polydipsia, polyuria (nocturia 3-4 times a night), myalgias, joint pain especially right shoulder, right hip. Denies chest pain, sob, throat pain, abdominal pain, dysuria, headaches, cold intolerance, pallor, rashes. Eats fast food, sodas, occasionally tries to cook healthy. Stays pretty active, is always "doing something".  Mouth sore - reports a ~3 week history of mouth sore on the roof of her mouth. Sore can be painful, makes it difficult to eat at times, does not bleed, sometimes has a white "dot", has not resolved with daily ora-gel, mouthwash. Was previously a heavy smoker more than 20 years ago. Has a difficult time quantifying but smoked >2ppd for 1-2 years and less than that for a a longer period of time when she did smoke. Denies any other aggravating or relieving factors, no other questions or concerns.   Past hx Patient Active Problem List   Diagnosis Date Noted  . Blood pressure elevated without history of HTN 11/01/2013  . Lower back pain 07/09/2012  . Hip pain 07/09/2012  . Cervical radiculopathy 05/29/2012  . Anxiety 05/29/2012  . Insomnia 05/29/2012  . BMI 31.0-31.9,adult 05/29/2012  . Depression 12/17/2011    Prior to Admission medications   Medication Sig Start Date End Date Taking? Authorizing Provider  cholecalciferol (VITAMIN D) 1000 UNITS tablet Take 1,000 Units by mouth daily.   Yes  Historical Provider, MD  meloxicam (MOBIC) 15 MG tablet Take 1 tablet (15 mg total) by mouth daily. 11/01/13  Yes Leandrew Koyanagi, MD  venlafaxine XR (EFFEXOR XR) 75 MG 24 hr capsule Take 1 capsule (75 mg total) by mouth daily with breakfast. 03/09/14  Yes Leandrew Koyanagi, MD  venlafaxine XR (EFFEXOR-XR) 150 MG 24 hr capsule TAKE ONE CAPSULE BY MOUTH DAILY 03/09/14  Yes Leandrew Koyanagi, MD  vitamin C (ASCORBIC ACID) 500 MG tablet Take 500 mg by mouth daily.   Yes Historical Provider, MD   effexor still working well Allergies  Allergen Reactions  . Codeine Nausea And Vomiting    Past Medical History  Diagnosis Date  . Allergy   . Arthritis     Past Surgical History  Procedure Laterality Date  . Cesarean section    . Abdominal hysterectomy      Family History  Problem Relation Age of Onset  . Diabetes Mother   . Cancer Father 34    Lung cancer  . Diabetes Sister   . Diabetes Brother   . Diabetes Sister     Review of Systems  HENT: Positive for mouth sores.   still c/o pain all over espec after activ in day--lots of early am stiffness. R shoulder pain day and night w/out injury. As in subjective.    Objective:   Physical Exam  Constitutional: She is oriented to person, place, and time. She appears well-developed and well-nourished. No distress.  BP 143/81  Pulse 77  Temp(Src) 98.5 F (36.9 C)  Resp 16  Ht 5\' 3"  (1.6 m)  Wt 168 lb (76.204 kg)  BMI 29.77 kg/m2  SpO2 98%   HENT:  Head: Normocephalic.  Mouth/Throat: Oropharynx is clear and moist and mucous membranes are normal. Oral lesions present. No oropharyngeal exudate.    Eyes: EOM are normal. Pupils are equal, round, and reactive to light. Right eye exhibits no discharge. Left eye exhibits no discharge. No scleral icterus.  Neck: Normal range of motion. Neck supple. No thyromegaly present.  Cardiovascular: Normal rate, regular rhythm, normal heart sounds and intact distal pulses.  Exam reveals no gallop  and no friction rub.   No murmur heard. Pulmonary/Chest: Effort normal and breath sounds normal. No stridor. She has no wheezes.  Abdominal: Soft. Bowel sounds are normal. She exhibits no mass. There is no tenderness.  Musculoskeletal: Normal range of motion. She exhibits tenderness (right shoulder with abduction and external rotation). She exhibits no edema.  Lymphadenopathy:    She has no cervical adenopathy.  Neurological: She is alert and oriented to person, place, and time. She has normal reflexes.  Skin: Skin is warm and dry. No rash noted. She is not diaphoretic. No erythema.  Psychiatric: She has a normal mood and affect. Her behavior is normal.       Assessment & Plan:   1. Need for prophylactic vaccination and inoculation against influenza - Flu Vaccine QUAD 36+ mos IM  2. Myalgia 3. Arthralgia 4. Pain in joint, shoulder region, right 5. Hip pain, right Labs today, referral to PT, f/u in 10/2014 for routine meds/care - ANA - Rheumatoid factor - CBC with Differential - POCT SEDIMENTATION RATE  6. RLS (restless legs syndrome) Rx neurontin 300, f/u in 10/2014 - POCT SEDIMENTATION RATE  7. Oral mucosal lesion Will refer for biopsy of lesion, rx viscous lidocaine, f/u s/p oral surgery evaluation - Ambulatory referral to Oral Maxillofacial Surgery   Jaynee Eagles, PA-C [facility] [phone] 07/25/2014 1:18 PM I have participated in the care of this patient with the Advanced Practice Provider and agree with Diagnosis and Plan as documented. Robert P. Laney Pastor, M.D.   Labs all wnl so will schedule rheumatology consult.

## 2014-07-26 ENCOUNTER — Encounter: Payer: Self-pay | Admitting: Internal Medicine

## 2014-07-26 LAB — ANA: Anti Nuclear Antibody(ANA): NEGATIVE

## 2014-07-26 LAB — SEDIMENTATION RATE: Sed Rate: 12 mm/hr (ref 0–22)

## 2014-07-26 NOTE — Addendum Note (Signed)
Addended by: Leandrew Koyanagi on: 07/26/2014 12:51 PM   Modules accepted: Orders

## 2014-10-19 ENCOUNTER — Other Ambulatory Visit: Payer: Self-pay | Admitting: Internal Medicine

## 2014-11-23 ENCOUNTER — Telehealth: Payer: Self-pay

## 2014-11-23 MED ORDER — GABAPENTIN 300 MG PO CAPS: 300.0000 mg | ORAL_CAPSULE | Freq: Three times a day (TID) | ORAL | Status: DC

## 2014-11-23 NOTE — Telephone Encounter (Signed)
Sent in Rx, pt notified.

## 2014-11-23 NOTE — Telephone Encounter (Signed)
Please advise on refill. I dont see this on the list as one of the medications I can refill.

## 2014-11-23 NOTE — Telephone Encounter (Signed)
Okay to call in refill for gabapentin as written on 07/25/2014

## 2014-11-23 NOTE — Telephone Encounter (Addendum)
PA needed for Gabapentin refill. [facility]   [phone]

## 2015-01-23 ENCOUNTER — Other Ambulatory Visit: Payer: Self-pay | Admitting: Internal Medicine

## 2015-02-07 ENCOUNTER — Other Ambulatory Visit: Payer: Self-pay | Admitting: Internal Medicine

## 2015-03-18 ENCOUNTER — Other Ambulatory Visit: Payer: Self-pay | Admitting: Internal Medicine

## 2015-03-19 ENCOUNTER — Ambulatory Visit (INDEPENDENT_AMBULATORY_CARE_PROVIDER_SITE_OTHER): Payer: 59 | Admitting: Internal Medicine

## 2015-03-19 VITALS — BP 138/82 | HR 78 | Temp 98.7°F | Resp 16 | Ht 63.0 in | Wt 172.0 lb

## 2015-03-19 DIAGNOSIS — M25551 Pain in right hip: Secondary | ICD-10-CM

## 2015-03-19 DIAGNOSIS — R202 Paresthesia of skin: Secondary | ICD-10-CM | POA: Diagnosis not present

## 2015-03-19 DIAGNOSIS — R5383 Other fatigue: Secondary | ICD-10-CM | POA: Diagnosis not present

## 2015-03-19 DIAGNOSIS — F329 Major depressive disorder, single episode, unspecified: Secondary | ICD-10-CM

## 2015-03-19 DIAGNOSIS — M5412 Radiculopathy, cervical region: Secondary | ICD-10-CM

## 2015-03-19 DIAGNOSIS — G2581 Restless legs syndrome: Secondary | ICD-10-CM | POA: Diagnosis not present

## 2015-03-19 DIAGNOSIS — M25511 Pain in right shoulder: Secondary | ICD-10-CM

## 2015-03-19 DIAGNOSIS — F419 Anxiety disorder, unspecified: Secondary | ICD-10-CM | POA: Diagnosis not present

## 2015-03-19 DIAGNOSIS — E663 Overweight: Secondary | ICD-10-CM | POA: Diagnosis not present

## 2015-03-19 DIAGNOSIS — F32A Depression, unspecified: Secondary | ICD-10-CM

## 2015-03-19 DIAGNOSIS — G4700 Insomnia, unspecified: Secondary | ICD-10-CM | POA: Diagnosis not present

## 2015-03-19 LAB — CBC WITH DIFFERENTIAL/PLATELET
Basophils Absolute: 0 10*3/uL (ref 0.0–0.1)
Basophils Relative: 0 % (ref 0–1)
Eosinophils Absolute: 0.4 10*3/uL (ref 0.0–0.7)
Eosinophils Relative: 4 % (ref 0–5)
HCT: 41.2 % (ref 36.0–46.0)
Hemoglobin: 14.1 g/dL (ref 12.0–15.0)
Lymphocytes Relative: 26 % (ref 12–46)
Lymphs Abs: 2.5 10*3/uL (ref 0.7–4.0)
MCH: 28.4 pg (ref 26.0–34.0)
MCHC: 34.2 g/dL (ref 30.0–36.0)
MCV: 82.9 fL (ref 78.0–100.0)
MPV: 8.9 fL (ref 8.6–12.4)
Monocytes Absolute: 0.6 10*3/uL (ref 0.1–1.0)
Monocytes Relative: 6 % (ref 3–12)
Neutro Abs: 6.2 10*3/uL (ref 1.7–7.7)
Neutrophils Relative %: 64 % (ref 43–77)
Platelets: 353 10*3/uL (ref 150–400)
RBC: 4.97 MIL/uL (ref 3.87–5.11)
RDW: 13.8 % (ref 11.5–15.5)
WBC: 9.7 10*3/uL (ref 4.0–10.5)

## 2015-03-19 LAB — LIPID PANEL
Cholesterol: 199 mg/dL (ref 0–200)
HDL: 45 mg/dL — ABNORMAL LOW (ref >=46)
LDL Cholesterol: 80 mg/dL (ref 0–99)
Total CHOL/HDL Ratio: 4.4 Ratio
Triglycerides: 370 mg/dL — ABNORMAL HIGH (ref <150)
VLDL: 74 mg/dL — ABNORMAL HIGH (ref 0–40)

## 2015-03-19 LAB — COMPREHENSIVE METABOLIC PANEL
ALT: 15 U/L (ref 0–35)
AST: 19 U/L (ref 0–37)
Albumin: 4.3 g/dL (ref 3.5–5.2)
Alkaline Phosphatase: 107 U/L (ref 39–117)
BUN: 12 mg/dL (ref 6–23)
CO2: 29 mEq/L (ref 19–32)
Calcium: 9.5 mg/dL (ref 8.4–10.5)
Chloride: 103 mEq/L (ref 96–112)
Creat: 0.6 mg/dL (ref 0.50–1.10)
Glucose, Bld: 90 mg/dL (ref 70–99)
Potassium: 4.3 mEq/L (ref 3.5–5.3)
Sodium: 143 mEq/L (ref 135–145)
Total Bilirubin: 0.2 mg/dL (ref 0.2–1.2)
Total Protein: 7.1 g/dL (ref 6.0–8.3)

## 2015-03-19 LAB — TSH: TSH: 1.58 u[IU]/mL (ref 0.350–4.500)

## 2015-03-19 MED ORDER — GABAPENTIN 300 MG PO CAPS: 300.0000 mg | ORAL_CAPSULE | Freq: Every day | ORAL | Status: DC

## 2015-03-19 MED ORDER — VENLAFAXINE HCL ER 75 MG PO CP24: 75.0000 mg | ORAL_CAPSULE | Freq: Every day | ORAL | Status: DC

## 2015-03-19 MED ORDER — VENLAFAXINE HCL ER 150 MG PO CP24: 150.0000 mg | ORAL_CAPSULE | Freq: Every day | ORAL | Status: DC

## 2015-03-19 MED ORDER — MELOXICAM 15 MG PO TABS: 15.0000 mg | ORAL_TABLET | Freq: Every day | ORAL | Status: DC

## 2015-03-19 NOTE — Progress Notes (Signed)
Subjective:  This chart was scribed for Rachel Lin, MD by Rachel Roth, Medical Scribe. This patient was seen in Room 10 and the patient's care was started at 3:43 PM.     Patient ID: Rachel Roth, female    DOB: [birth year], 49 y.o.   MRN: [id number]  HPI Rachel Roth is a 49 y.o. female who presents to [facility] for medication refill.  Her last visit was last summer general care and for insomnia and depression.  Right now, she's been taking neurontin before she goes to sleep--insomnia related to RLS has responded to this. She is also still on the effexor. She didn't take her medication this morning. She takes meloxicam for her hip.   She notes some neck pain that has been bothering her-on and off for 4 years. She has been using the same pillow for the past 2 years. This helped at first. She's not had an orthopedic evaluation of her neck except x-rays showed some arthritis. It's also been affecting her hands and more on her left hand with paresthesias especially after use. It hurts more when she's writing and sometimes when she's driving. She can reproduce the pain just by turning her head to the left and to the right. She had carpal tunnel work evaluation about a year ago which was negative. She also notes some right hip pain and right shoulder pain. She has been told these were due to osteoarthritis.  She's been feeling fatigue lately. She does not believe this is related to use sleep deprivation. She denies snoring at night and sleep apnea. She denies weight gain, change in hair, change in exercising.  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  She also notes some intermittent soreness under her right arm. She denies swelling or abscess. The pain occurs about once a month for a few days. She is noted no breast lumps. She has had no axillary abscesses. Her last mammogram in February and it was negative for any abnormalities.   She has been  taking vitamin D. She had a hysterectomy.   She's been taking care of animals at home including a PET Pig and several hands.   Patient Active Problem List   Diagnosis Date Noted  . RLS (restless legs syndrome) 03/19/2015  . Roth pressure elevated without history of HTN 11/01/2013  . Lower back pain--- no longer active  07/09/2012  . Hip pain-R continues to affect activity  07/09/2012  . Cervical radiculopathy--with left arm and hand symptoms  05/29/2012  . Anxiety----feels like this is controlled by her medications  05/29/2012  . Insomnia--due to RLS controlled by medication  05/29/2012  . BMI 31.0-31.9,adult 05/29/2012  . Depression--- she feels no worse than she has in the past and doesn't think this is a reason for her fatigue  12/17/2011    Current Outpatient Prescriptions on File Prior to Visit  Medication Sig Dispense Refill  . cholecalciferol (VITAMIN D) 1000 UNITS tablet Take 1,000 Units by mouth daily.    Marland Kitchen gabapentin (NEURONTIN) 300 MG capsule Take 1 capsule (300 mg total) by mouth 3 (three) times daily. 30 capsule 3  . meloxicam (MOBIC) 15 MG tablet Take 1 tablet (15 mg total) by mouth daily. PATIENT NEEDS OFFICE VISIT FOR ADDITIONAL REFILLS 30 tablet 0  . venlafaxine XR (EFFEXOR XR) 75 MG 24 hr capsule Take 1 capsule (75 mg total) by mouth daily with breakfast. 90 capsule 3  . venlafaxine XR (EFFEXOR-XR) 150  MG 24 hr capsule TAKE ONE CAPSULE BY MOUTH EVERY DAY 90 capsule 0  . vitamin C (ASCORBIC ACID) 500 MG tablet Take 500 mg by mouth daily.     No current facility-administered medications on file prior to visit.      Review of Systems  Constitutional: Positive for fatigue. Negative for fever, chills, activity change, appetite change and unexpected weight change.  HENT: Negative for congestion, rhinorrhea, sneezing and sore throat.   Eyes: Negative for visual disturbance.  Respiratory: Negative for chest tightness and shortness of breath.   Cardiovascular: Negative  for chest pain, palpitations and leg swelling.  Gastrointestinal: Negative for nausea, vomiting, abdominal pain and diarrhea.  Endocrine: Negative for polyuria.  Musculoskeletal: Positive for arthralgias (left wrist, right hip pain), neck pain and neck stiffness.  Neurological: Negative for tremors and headaches.  Psychiatric/Behavioral: Negative for sleep disturbance.       Objective:   Physical Exam  Constitutional: She is oriented to person, place, and time. She appears well-developed and well-nourished. No distress.  HENT:  Head: Normocephalic and atraumatic.  Eyes: EOM are normal. Pupils are equal, round, and reactive to light.  Neck: Neck supple.  Cardiovascular: Normal rate, regular rhythm and normal heart sounds.   Pulmonary/Chest: Effort normal. No respiratory distress.  Musculoskeletal: Normal range of motion.  Neurological: She is alert and oriented to person, place, and time.  Skin: Skin is warm and dry.  Psychiatric: She has a normal mood and affect. Her behavior is normal.  Nursing note and vitals reviewed.   BP 138/82 mmHg  Pulse 78  Temp(Src) 98.7 F (37.1 C) (Oral)  Resp 16  Ht 5\' 3"  (1.6 m)  Wt 172 lb (78.019 kg)  BMI 30.48 kg/m2  SpO2 99%       Assessment & Plan:  Depression /Anxiety/- Plan: Refilled medication  RLS (restless legs syndrome) causingInsomnia - Plan: gabapentin (NEURONTIN) 300 MG capsule refilled  Hip pain, right - Plan: meloxicam (MOBIC) 15 MG tablet Cervical radiculopathy - Plan: meloxicam (MOBIC) 15 MG tablet, Ambulatory referral to Orthopedic Surgery Pain in joint, shoulder region, right - Plan: meloxicam (MOBIC) 15 MG tablet -----Will refer to [place] for full evaluation of her multiple joint abnormalities focusing first on her cervical spine  Left hand paresthesia=? Cervical radiculopathy  Other fatigue - Plan: CBC with Differential/Platelet, Comprehensive metabolic panel, TSH///discuss lab results when  available  Overweight - Plan: Lipid panel  Meds ordered this encounter  Medications  . meloxicam (MOBIC) 15 MG tablet    Sig: Take 1 tablet (15 mg total) by mouth daily.    Dispense:  30 tablet    Refill:  11  . gabapentin (NEURONTIN) 300 MG capsule    Sig: Take 1 capsule (300 mg total) by mouth at bedtime.    Dispense:  90 capsule    Refill:  3  . venlafaxine XR (EFFEXOR XR) 75 MG 24 hr capsule    Sig: Take 1 capsule (75 mg total) by mouth daily with breakfast.    Dispense:  90 capsule    Refill:  3  . venlafaxine XR (EFFEXOR-XR) 150 MG 24 hr capsule    Sig: Take 1 capsule (150 mg total) by mouth daily.    Dispense:  90 capsule    Refill:  3      I have completed the patient encounter in its entirety as documented by the scribe, with editing by me where necessary. Nissi Doffing P. Laney Pastor, M.D.

## 2015-03-24 ENCOUNTER — Encounter: Payer: Self-pay | Admitting: Internal Medicine

## 2015-03-26 ENCOUNTER — Ambulatory Visit (INDEPENDENT_AMBULATORY_CARE_PROVIDER_SITE_OTHER): Payer: Commercial Managed Care - HMO | Admitting: Family Medicine

## 2015-03-26 VITALS — BP 118/80 | HR 76 | Temp 98.5°F | Resp 16 | Ht 63.0 in | Wt 172.8 lb

## 2015-03-26 DIAGNOSIS — R195 Other fecal abnormalities: Secondary | ICD-10-CM | POA: Diagnosis not present

## 2015-03-26 DIAGNOSIS — R109 Unspecified abdominal pain: Secondary | ICD-10-CM

## 2015-03-26 MED ORDER — DICYCLOMINE HCL 20 MG PO TABS
20.0000 mg | ORAL_TABLET | Freq: Four times a day (QID) | ORAL | Status: DC
Start: 2015-03-26 — End: 2016-01-09

## 2015-03-26 NOTE — Progress Notes (Signed)
[facility] [address], [place] [address] [phone]  Date:  03/26/2015   Name:  Rachel Roth   DOB:  02/01/1966   MRN:  [id number]  PCP:  Leandrew Koyanagi, MD    Chief Complaint: Abdominal Pain; Diarrhea; and Blood In Stools   History of Present Illness:  Rachel Roth is a 49 y.o. very pleasant female patient who presents with the following:  She notes that "my stomach has been bothering me some."  She has had bowel concerns for a few weeks- she is not really sure how long.  She notes that she will have abd discomfort and then will need to have a stool.  She feels like there are "little white spots in my stool."  When she does have a stool it is loose but is not diarrhea, no color change She has a lot of animals at her home, and I think she is most worried about some sort of parasite   She may have a stool 1-2 times a day.   She stopped one of her vitamins as she thought this might be her trigger but this did not help  She is NOT having blood in her stool, she has not recently noted any mucus in her stool although she did have this in the distant past No vomiting Notes that her appetite is variable.  She has not noted any variation in her stools with eating or not eating.    She has not had a sensitive stomach during her life No family history of IBD No weight loss- in fact she has gained some  Wt Readings from Last 3 Encounters:  03/26/15 172 lb 12.8 oz (78.382 kg)  03/19/15 172 lb (78.019 kg)  07/25/14 168 lb (76.204 kg)    Patient Active Problem List   Diagnosis Date Noted  . RLS (restless legs syndrome) 03/19/2015  . Blood pressure elevated without history of HTN 11/01/2013  . Lower back pain 07/09/2012  . Hip pain 07/09/2012  . Cervical radiculopathy 05/29/2012  . Anxiety 05/29/2012  . Insomnia 05/29/2012  . BMI 31.0-31.9,adult 05/29/2012  . Depression 12/17/2011    Past Medical History  Diagnosis Date  . Allergy    . Arthritis     Past Surgical History  Procedure Laterality Date  . Cesarean section    . Abdominal hysterectomy      History  Substance Use Topics  . Smoking status: Former Research scientist (life sciences)  . Smokeless tobacco: Not on file  . Alcohol Use: No    Family History  Problem Relation Age of Onset  . Diabetes Mother   . Cancer Father 29    Lung cancer  . Diabetes Sister   . Diabetes Brother   . Diabetes Sister     Allergies  Allergen Reactions  . Codeine Nausea And Vomiting    Medication list has been reviewed and updated.  Current Outpatient Prescriptions on File Prior to Visit  Medication Sig Dispense Refill  . cholecalciferol (VITAMIN D) 1000 UNITS tablet Take 1,000 Units by mouth daily.    Marland Kitchen gabapentin (NEURONTIN) 300 MG capsule Take 1 capsule (300 mg total) by mouth at bedtime. 90 capsule 3  . meloxicam (MOBIC) 15 MG tablet Take 1 tablet (15 mg total) by mouth daily. 30 tablet 11  . venlafaxine XR (EFFEXOR XR) 75 MG 24 hr capsule Take 1 capsule (75 mg total) by mouth daily with breakfast. 90 capsule 3  . venlafaxine XR (EFFEXOR-XR)  150 MG 24 hr capsule Take 1 capsule (150 mg total) by mouth daily. 90 capsule 3  . vitamin C (ASCORBIC ACID) 500 MG tablet Take 500 mg by mouth daily.     No current facility-administered medications on file prior to visit.    Review of Systems:  As per HPI- otherwise negative.   Physical Examination: Filed Vitals:   03/26/15 1427  BP: 118/80  Pulse: 76  Temp: 98.5 F (36.9 C)  Resp: 16   Filed Vitals:   03/26/15 1427  Height: 5\' 3"  (1.6 m)  Weight: 172 lb 12.8 oz (78.382 kg)   Body mass index is 30.62 kg/(m^2). Ideal Body Weight: Weight in (lb) to have BMI = 25: 140.8  GEN: WDWN, NAD, Non-toxic, A & O x 3, overweight, looks well HEENT: Atraumatic, Normocephalic. Neck supple. No masses, No LAD. Ears and Nose: No external deformity. CV: RRR, No M/G/R. No JVD. No thrill. No extra heart sounds. PULM: CTA B, no wheezes, crackles,  rhonchi. No retractions. No resp. distress. No accessory muscle use. ABD: S,  ND, +BS. No rebound. No HSM.  Minimal epigastric discomfort, benign exam overall EXTR: No c/c/e NEURO Normal gait.  PSYCH: Normally interactive. Conversant. Not depressed or anxious appearing.  Calm demeanor.   Assessment and Plan: Abdominal cramps - Plan: dicyclomine (BENTYL) 20 MG tablet  Loose stools - Plan: Ova and parasite examination, Stool culture  Rachel Roth is here today with concern about some "little white seeds" in her stools.  Will do stool studies for her but reassured that this is unlikely to be anything of serious concern.  She may try bentyl if needed for abd cramping.  Asked her to let me know if any changes in her sx, she had a recent full labs panel that looked fine except for high triglycerides.  Advised her to try losing about 10 lbs to work on her triglycerides   Signed Lamar Blinks, MD

## 2015-03-26 NOTE — Patient Instructions (Signed)
I will be in touch as soon as I get your stool tests back Try the bentyl as needed for abdominal spasm Let me know if your symptoms are getting worse or changing

## 2015-04-02 ENCOUNTER — Telehealth: Payer: Self-pay

## 2015-04-02 NOTE — Telephone Encounter (Signed)
-----   Message from Darreld Mclean, MD sent at 03/31/2015 11:45 AM EDT ----- Please call and check in with her- did she bring in her stool sample?  I have not seen the result come in yet.

## 2015-04-03 NOTE — Telephone Encounter (Signed)
Patient is feeling a little better she will bring sample in this week she's at the hospital with her mom

## 2015-04-10 NOTE — Addendum Note (Signed)
Addended by: Constance Goltz on: 04/10/2015 12:26 PM   Modules accepted: Orders

## 2015-04-11 LAB — OVA AND PARASITE EXAMINATION: OP: NONE SEEN

## 2015-04-14 ENCOUNTER — Encounter: Payer: Self-pay | Admitting: Family Medicine

## 2015-04-14 LAB — STOOL CULTURE

## 2015-04-16 ENCOUNTER — Other Ambulatory Visit: Payer: Self-pay | Admitting: Internal Medicine

## 2015-04-22 ENCOUNTER — Other Ambulatory Visit: Payer: Self-pay | Admitting: Internal Medicine

## 2015-04-23 ENCOUNTER — Telehealth: Payer: Self-pay

## 2015-04-23 NOTE — Telephone Encounter (Signed)
Patient takes 150 mg and 75 mg total of 225.  Needs 75 mg.  [facility] [address] [facility] [place]

## 2015-04-24 MED ORDER — VENLAFAXINE HCL ER 75 MG PO CP24: 75.0000 mg | ORAL_CAPSULE | Freq: Every day | ORAL | Status: DC

## 2015-04-24 NOTE — Telephone Encounter (Signed)
Rx sent 

## 2015-05-12 ENCOUNTER — Telehealth: Payer: Self-pay

## 2015-05-12 DIAGNOSIS — G2581 Restless legs syndrome: Secondary | ICD-10-CM

## 2015-05-12 NOTE — Telephone Encounter (Signed)
DO[place]LE - Pt thinks that she needs to up the dose on her gabapentin like you have discussed.  Please call [phone]

## 2015-05-13 ENCOUNTER — Other Ambulatory Visit: Payer: Self-pay | Admitting: Internal Medicine

## 2015-05-13 MED ORDER — GABAPENTIN 300 MG PO CAPS: 600.0000 mg | ORAL_CAPSULE | Freq: Every day | ORAL | Status: DC

## 2015-05-13 NOTE — Telephone Encounter (Signed)
Meds ordered this encounter  Medications  . gabapentin (NEURONTIN) 300 MG capsule    Sig: Take 2 capsules (600 mg total) by mouth at bedtime.    Dispense:  180 capsule    Refill:  3   Ok to incr for rls

## 2015-05-14 NOTE — Telephone Encounter (Signed)
Left message on machine letting pt know.

## 2015-07-22 ENCOUNTER — Other Ambulatory Visit: Payer: Self-pay | Admitting: Internal Medicine

## 2015-08-02 LAB — HM MAMMOGRAPHY

## 2015-08-16 LAB — HM MAMMOGRAPHY

## 2015-08-26 ENCOUNTER — Encounter: Payer: Self-pay | Admitting: Internal Medicine

## 2015-09-02 ENCOUNTER — Encounter: Payer: Self-pay | Admitting: Family Medicine

## 2015-10-24 ENCOUNTER — Other Ambulatory Visit: Payer: Self-pay | Admitting: Internal Medicine

## 2015-12-05 ENCOUNTER — Telehealth: Payer: Self-pay

## 2015-12-05 MED ORDER — VENLAFAXINE HCL ER 75 MG PO CP24: 75.0000 mg | ORAL_CAPSULE | Freq: Every day | ORAL | Status: DC

## 2015-12-05 NOTE — Telephone Encounter (Signed)
Rx sent in, advised to make an appt for follow up.

## 2015-12-05 NOTE — Telephone Encounter (Signed)
venlafaxine XR (EFFEXOR-XR) 75 MG 24 hr capsule   Takes two and did not receive this one.    She has the 150 venlafaxine XR.    [id number] [place] [place] - [address] [place]

## 2016-01-09 ENCOUNTER — Encounter: Payer: Self-pay | Admitting: Family Medicine

## 2016-01-09 ENCOUNTER — Ambulatory Visit (INDEPENDENT_AMBULATORY_CARE_PROVIDER_SITE_OTHER): Payer: Commercial Managed Care - HMO | Admitting: Family Medicine

## 2016-01-09 VITALS — BP 118/86 | HR 77 | Temp 98.2°F | Resp 16 | Ht 62.75 in | Wt 167.0 lb

## 2016-01-09 DIAGNOSIS — M2550 Pain in unspecified joint: Secondary | ICD-10-CM | POA: Diagnosis not present

## 2016-01-09 DIAGNOSIS — M5412 Radiculopathy, cervical region: Secondary | ICD-10-CM

## 2016-01-09 DIAGNOSIS — L709 Acne, unspecified: Secondary | ICD-10-CM

## 2016-01-09 DIAGNOSIS — F329 Major depressive disorder, single episode, unspecified: Secondary | ICD-10-CM

## 2016-01-09 DIAGNOSIS — M25559 Pain in unspecified hip: Secondary | ICD-10-CM

## 2016-01-09 DIAGNOSIS — F39 Unspecified mood [affective] disorder: Secondary | ICD-10-CM | POA: Diagnosis not present

## 2016-01-09 DIAGNOSIS — M25569 Pain in unspecified knee: Secondary | ICD-10-CM

## 2016-01-09 DIAGNOSIS — E781 Pure hyperglyceridemia: Secondary | ICD-10-CM

## 2016-01-09 DIAGNOSIS — F419 Anxiety disorder, unspecified: Secondary | ICD-10-CM

## 2016-01-09 DIAGNOSIS — M25551 Pain in right hip: Secondary | ICD-10-CM

## 2016-01-09 DIAGNOSIS — G2581 Restless legs syndrome: Secondary | ICD-10-CM

## 2016-01-09 DIAGNOSIS — F32A Depression, unspecified: Secondary | ICD-10-CM

## 2016-01-09 DIAGNOSIS — M25511 Pain in right shoulder: Secondary | ICD-10-CM

## 2016-01-09 MED ORDER — VENLAFAXINE HCL ER 75 MG PO CP24: 225.0000 mg | ORAL_CAPSULE | Freq: Every day | ORAL | Status: DC

## 2016-01-09 MED ORDER — MELOXICAM 15 MG PO TABS: 15.0000 mg | ORAL_TABLET | Freq: Every day | ORAL | Status: DC

## 2016-01-09 MED ORDER — TRETINOIN 0.025 % EX GEL: Freq: Every day | CUTANEOUS | Status: DC

## 2016-01-09 NOTE — Patient Instructions (Signed)
Try very high dose joint supplement like glucosamine-chondroiton (>1500mg  of each), tumeric, circumin.  Why not consider trying out a therapist - little to loose [facility]   [facility] [address] [place], [place], [place], [place] [address] Phone: [phone]  [place]  [address] [place], [place] [address]  [phone]  [facility]  [address], [place], [place] [address]  [phone]  Dr. Tomasita Crumble [place]'s office

## 2016-01-09 NOTE — Progress Notes (Signed)
Subjective:    Patient ID: Rachel Roth, female    DOB: 1966/04/20, 50 y.o.   MRN: [id number] Chief Complaint  Patient presents with  . Medication Refill    EFFEXOR-XR for 90 day    HPI  Rachel Roth is a 50 yo woman who is here today for medication refills.  This is my first time meeting her.  She reports that she has been on the effexor for several years and has noticed worsening mood symptoms over the past few months. She reports being on mood medications for decades - most of life. Has not seen a psychiatrist or therapist ever in the past. Usually she is just anxious but has noticed that she is getting a lot more irriation and lack of patience. Stays tired a lot.  Mood sxs usu do better in the summer.    Had a hysterectomy >20 yrs ago and has just 1 ovary.  Is noticing that she is more foggy - lightheaded.  She is still having some joint aching and some days notices more limiitations even with the meloxicam compared to prior.  She will occ take a tylenol on top of it but not using any otc nsaids. Taking vit D some day  C/o worsening acne - using aveeno lotion and clean and clear soap w/o improvment. Has not tried any rx trx for this.  Depression screen [facility] 2/9 01/09/2016 03/26/2015 03/19/2015 07/25/2014  Decreased Interest 1 0 2 0  Down, Depressed, Hopeless 0 0 1 0  PHQ - 2 Score 1 0 3 0  Altered sleeping - - 1 -  Tired, decreased energy - - 3 -  Change in appetite - - 1 -  Feeling bad or failure about yourself  - - 0 -  Trouble concentrating - - 0 -  Moving slowly or fidgety/restless - - 0 -  Suicidal thoughts - - 0 -  PHQ-9 Score - - 8 -  Difficult doing work/chores - - Somewhat difficult -   Past Medical History  Diagnosis Date  . Allergy   . Arthritis    Past Surgical History  Procedure Laterality Date  . Cesarean section    . Abdominal hysterectomy     Current Outpatient Prescriptions on File Prior to Visit  Medication Sig Dispense Refill  . cholecalciferol  (VITAMIN D) 1000 UNITS tablet Take 1,000 Units by mouth daily.    Marland Kitchen gabapentin (NEURONTIN) 300 MG capsule Take 2 capsules (600 mg total) by mouth at bedtime. 180 capsule 3  . vitamin C (ASCORBIC ACID) 500 MG tablet Take 500 mg by mouth daily. Reported on 01/09/2016     No current facility-administered medications on file prior to visit.   Allergies  Allergen Reactions  . Codeine Nausea And Vomiting   Family History  Problem Relation Age of Onset  . Diabetes Mother   . Cancer Father 31    Lung cancer  . Diabetes Sister   . Diabetes Brother   . Diabetes Sister    Social History   Social History  . Marital Status: Married    Spouse Name: N/A  . Number of Children: N/A  . Years of Education: N/A   Social History Main Topics  . Smoking status: Former Research scientist (life sciences)  . Smokeless tobacco: None  . Alcohol Use: No  . Drug Use: No  . Sexual Activity: Yes    Birth Control/ Protection: None   Other Topics Concern  . None   Social History Narrative  Review of Systems  Constitutional: Positive for diaphoresis and fatigue. Negative for fever, chills, activity change, appetite change and unexpected weight change.  Cardiovascular: Negative for chest pain.  Gastrointestinal: Negative for nausea and vomiting.  Genitourinary: Negative for vaginal bleeding and menstrual problem.  Musculoskeletal: Positive for back pain, joint swelling and arthralgias.  Skin: Positive for rash.  Neurological: Positive for light-headedness. Negative for dizziness.  Psychiatric/Behavioral: Positive for behavioral problems, confusion, dysphoric mood, decreased concentration and agitation. The patient is nervous/anxious. The patient is not hyperactive.        Objective:  BP 118/86 mmHg  Pulse 77  Temp(Src) 98.2 F (36.8 C) (Oral)  Resp 16  Ht 5' 2.75" (1.594 m)  Wt 167 lb (75.751 kg)  BMI 29.81 kg/m2  SpO2 98%  Physical Exam  Constitutional: She is oriented to person, place, and time. She appears  well-developed and well-nourished. No distress.  HENT:  Head: Normocephalic and atraumatic.  Right Ear: External ear normal.  Left Ear: External ear normal.  Eyes: Conjunctivae are normal. No scleral icterus.  Neck: Normal range of motion. Neck supple. No thyromegaly present.  Cardiovascular: Normal rate, regular rhythm, normal heart sounds and intact distal pulses.   Pulmonary/Chest: Effort normal and breath sounds normal. No respiratory distress.  Musculoskeletal: She exhibits no edema.  Lymphadenopathy:    She has no cervical adenopathy.  Neurological: She is alert and oriented to person, place, and time.  Skin: Skin is warm and dry. She is not diaphoretic. No erythema.  Psychiatric: Her behavior is normal. Her affect is blunt. She exhibits a depressed mood.          Assessment & Plan:  Acne - try tretinoin.  If it is still not covered thera appears to be a product of clindamycin with the retin a that is looks like it will be covered RLS - cont to take the gabapentin 300 qhs - send in for refill whenever needed Mood d/o: Cont effexor 225 - rec considering therapy to augment trx due to recent worsening of mood sxs.  1. Chronic arthralgias of knees and hips, unspecified laterality   2. Hip pain, right   3. Cervical radiculopathy   4. Pain in joint, shoulder region, right   Recommend trial of glucosamine-condroiton or other supplemental anti-inflammatory.  Increase omega-3s in diet and consider fish oil supplement to help with high trig and low hdl. May foreshadow a future of glucose intolerance w/ strong FHx of same.  Meds ordered this encounter  Medications  . tretinoin (RETIN-A) 0.025 % gel    Sig: Apply topically at bedtime. For acne    Dispense:  45 g    Refill:  1  . venlafaxine XR (EFFEXOR-XR) 75 MG 24 hr capsule    Sig: Take 3 capsules (225 mg total) by mouth daily with breakfast.    Dispense:  270 capsule    Refill:  2  . meloxicam (MOBIC) 15 MG tablet    Sig:  Take 1 tablet (15 mg total) by mouth daily.    Dispense:  90 tablet    Refill:  3     Delman Cheadle, MD MPH

## 2016-01-10 ENCOUNTER — Telehealth: Payer: Self-pay

## 2016-01-10 MED ORDER — TRETINOIN 0.025 % EX GEL
Freq: Every day | CUTANEOUS | Status: DC
Start: 2016-01-10 — End: 2016-11-11

## 2016-01-10 NOTE — Telephone Encounter (Signed)
Sent new Rx for 15 gm tubes, ins will not cover 45 gm. PA completed on covermymeds and it was approved. Notified pharm.

## 2016-10-25 DIAGNOSIS — M1990 Unspecified osteoarthritis, unspecified site: Secondary | ICD-10-CM | POA: Insufficient documentation

## 2016-10-25 NOTE — Progress Notes (Signed)
Subjective:    Patient ID: Rachel Roth, female    DOB: September 08, 1966, 51 y.o.   MRN: [id number] Chief Complaint  Patient presents with  . Medication Problem    effexor  . breast pain    pt is having pain on right side of breast    HPI  Rachel Roth is a 51 yo woman here for refills on her chronic medications. She was a patient of my former partners. I have met her once prior at her visit 9 mos ago for the same.  Mood d/o: Has been on medications for this for most of her life for anxiety, irritability, fatigue, and impatience. Has been on effexor 225 mg for sev years. Has never seen psychiatry/counseling and as sxs were worsening at last visit I recommended that she look into therapy to augment her trx. Sxs do have a seasonal component often worsening in the winter.  Arthralgias: prn APAP as has daily sxs and some days feels limited by them - gets stiff if she stops taking it. Takes vit D occ. Neg rheum labs >2 yrs prior. I had recommended pt try a glucosamine-condroiton or circumin supp but she did not.  Stopped meloxicam as worried about side effects from long-term use.  RLS: On gabapenin 331m qhs   Acne: tried pt on tretinoin 0.25% gel qhs at last visit.    Hypertriglyceridemia with low HDL - Lipid panel 18 mos prior had trig 370, hdl 45, ldl 80, non-hdl 154. Had rec pt increase diet in omega-3s and consider trying a supp such as fish oil which she has not been doing. Pt is NOT fasting today - had eggs, tater hots, and weenie. Pt does have a strong FHx of glucose intolerance as well so she is at risk for metabolic syndrome. BMI hovers right on the 30 cut-off for years  Has a shooting pain occ in right anterior mid thigh.   Has an appointment for a mammogram on Thursday.  They saw a spot on her regular mammogram and she has to go back.  Since then she has had pain in her right lateral breast for 14 months. They told her to mention her doctor which she is just now doing. No other sxs  there.    She does have more moles.  Past Medical History:  Diagnosis Date  . Allergy   . Arthritis    Past Surgical History:  Procedure Laterality Date  . ABDOMINAL HYSTERECTOMY    . CESAREAN SECTION     No current outpatient prescriptions on file prior to visit.   No current facility-administered medications on file prior to visit.    Allergies  Allergen Reactions  . Codeine Nausea And Vomiting   Family History  Problem Relation Age of Onset  . Diabetes Mother   . Cancer Father 759   Lung cancer  . Diabetes Sister   . Diabetes Brother   . Diabetes Sister    Social History   Social History  . Marital status: Married    Spouse name: N/A  . Number of children: N/A  . Years of education: N/A   Social History Main Topics  . Smoking status: Former SResearch scientist (life sciences) . Smokeless tobacco: None  . Alcohol use No  . Drug use: No  . Sexual activity: Yes    Birth control/ protection: None   Other Topics Concern  . None   Social History Narrative  . None   [facility] 10/26/2016 01/09/2016  03/26/2015 03/19/2015 07/25/2014  Decreased Interest 0 1 0 2 0  Down, Depressed, Hopeless 0 0 0 1 0  PHQ - 2 Score 0 1 0 3 0  Altered sleeping - - - 1 -  Tired, decreased energy - - - 3 -  Change in appetite - - - 1 -  Feeling bad or failure about yourself  - - - 0 -  Trouble concentrating - - - 0 -  Moving slowly or fidgety/restless - - - 0 -  Suicidal thoughts - - - 0 -  PHQ-9 Score - - - 8 -  Difficult doing work/chores - - - Somewhat difficult -     Review of Systems See hpi    Objective:   Physical Exam  Constitutional: She is oriented to person, place, and time. She appears well-developed and well-nourished. No distress.  HENT:  Head: Normocephalic and atraumatic.  Right Ear: External ear normal.  Left Ear: External ear normal.  Eyes: Conjunctivae are normal. No scleral icterus.  Neck: Normal range of motion. Neck supple. No thyromegaly present.    Cardiovascular: Normal rate, regular rhythm, normal heart sounds and intact distal pulses.   Pulmonary/Chest: Effort normal and breath sounds normal. No respiratory distress.  Musculoskeletal: She exhibits no edema.  Lymphadenopathy:    She has no cervical adenopathy.  Neurological: She is alert and oriented to person, place, and time.  Skin: Skin is warm and dry. She is not diaphoretic. No erythema.  Psychiatric: She has a normal mood and affect. Her behavior is normal.         BP (!) 144/81   Pulse 79   Temp 98.2 F (36.8 C) (Oral)   Resp 16   Ht '5\' 2"'  (1.575 m)   Wt 172 lb 6.4 oz (78.2 kg)   SpO2 97%   BMI 31.53 kg/m   Assessment & Plan:  Has not had labs in 18 mos. Cmp, cbc, ferritin, lipid, a1c ordered  - not fasting can't get lipid panel Rec CPE - none seen in past few yrs  1. RLS (restless legs syndrome) - I think pt is still taking gabapentin 355m qhs for this.  2. Recurrent major depressive disorder, in partial remission ([place]   3. Arthritis   4. Blood pressure elevated without history of HTN   5. BMI 31.0-31.9,adult   6. Acne, unspecified acne type   7. Hypertriglyceridemia   8. Breast pain, right     Orders Placed This Encounter  Procedures  . [place]TE UNI RIGHT INC AXILLA    Standing Status:   Future    Standing Expiration Date:   12/24/2017    Scheduling Instructions:     [facility]- would like to get this changed to diagnostic with [place]    Order Specific Question:   Reason for Exam (SYMPTOM  OR DIAGNOSIS REQUIRED)    Answer:   persistent upper outer right breast pain x 14 mos, at 10 oclock near are[place]    Order Specific Question:   Preferred imaging location?    Answer:   External    Comments:   [facility]  . [facility]    Standing Status:   Future    Standing Expiration Date:   12/24/2017    Scheduling Instructions:     Pt [facility]- would like to  get this changed to diagnostic with [place]  Order Specific Question:   Reason for Exam (SYMPTOM  OR DIAGNOSIS REQUIRED)    Answer:   persistent upper outer right breast pain x 14 mos, at 10 oclock near are[place]    Order Specific Question:   Is the patient pregnant?    Answer:   No    Order Specific Question:   Preferred imaging location?    Answer:   External    Comments:   [facility]  . CBC  . Comprehensive metabolic panel    Order Specific Question:   Has the patient fasted?    Answer:   Yes  . Ferritin  . Hemoglobin A1c  . Care order/instruction:    AVS and GO    Scheduling Instructions:     AVS and GO    Meds ordered this encounter  Medications  . venlafaxine XR (EFFEXOR-XR) 75 MG 24 hr capsule    Sig: Take 3 capsules (225 mg total) by mouth daily with breakfast.    Dispense:  270 capsule    Refill:  3      Delman Cheadle, M.D.  Primary Care at [facility] [address] [place], [place] [address] [phone] phone [phone] fax  11/11/16 4:31 PM

## 2016-10-26 ENCOUNTER — Encounter: Payer: Self-pay | Admitting: Family Medicine

## 2016-10-26 ENCOUNTER — Ambulatory Visit (INDEPENDENT_AMBULATORY_CARE_PROVIDER_SITE_OTHER): Payer: Commercial Managed Care - HMO | Admitting: Family Medicine

## 2016-10-26 VITALS — BP 144/81 | HR 79 | Temp 98.2°F | Resp 16 | Ht 62.0 in | Wt 172.4 lb

## 2016-10-26 DIAGNOSIS — L709 Acne, unspecified: Secondary | ICD-10-CM | POA: Diagnosis not present

## 2016-10-26 DIAGNOSIS — Z6831 Body mass index (BMI) 31.0-31.9, adult: Secondary | ICD-10-CM | POA: Diagnosis not present

## 2016-10-26 DIAGNOSIS — F3341 Major depressive disorder, recurrent, in partial remission: Secondary | ICD-10-CM

## 2016-10-26 DIAGNOSIS — E781 Pure hyperglyceridemia: Secondary | ICD-10-CM

## 2016-10-26 DIAGNOSIS — N644 Mastodynia: Secondary | ICD-10-CM

## 2016-10-26 DIAGNOSIS — M1990 Unspecified osteoarthritis, unspecified site: Secondary | ICD-10-CM

## 2016-10-26 DIAGNOSIS — R030 Elevated blood-pressure reading, without diagnosis of hypertension: Secondary | ICD-10-CM

## 2016-10-26 DIAGNOSIS — G2581 Restless legs syndrome: Secondary | ICD-10-CM | POA: Diagnosis not present

## 2016-10-26 MED ORDER — VENLAFAXINE HCL ER 75 MG PO CP24: 225.0000 mg | ORAL_CAPSULE | Freq: Every day | ORAL | 3 refills | Status: DC

## 2016-10-26 NOTE — Patient Instructions (Addendum)
   IF you received an x-ray today, you will receive an invoice from [facility]. Please contact [facility] at [phone] with questions or concerns regarding your invoice.   IF you received labwork today, you will receive an invoice from [facility]. Please contact [facility] at [phone] with questions or concerns regarding your invoice.   Our billing staff will not be able to assist you with questions regarding bills from these companies.  You will be contacted with the lab results as soon as they are available. The fastest way to get your results is to activate your My Chart account. Instructions are located on the last page of this paperwork. If you have not heard from us regarding the results in 2 weeks, please contact this office.    Food Choices to Lower Your Triglycerides Triglycerides are a type of fat in your blood. High levels of triglycerides can increase the risk of heart disease and stroke. If your triglyceride levels are high, the foods you eat and your eating habits are very important. Choosing the right foods can help lower your triglycerides. What general guidelines do I need to follow?  Lose weight if you are overweight.  Limit or avoid alcohol.  Fill one half of your plate with vegetables and green salads.  Limit fruit to two servings a day. Choose fruit instead of juice.  Make one fourth of your plate whole grains. Look for the word "whole" as the first word in the ingredient list.  Fill one fourth of your plate with lean protein foods.  Enjoy fatty fish (such as salmon, mackerel, sardines, and tuna) three times a week.  Choose healthy fats.  Limit foods high in starch and sugar.  Eat more home-cooked food and less restaurant, buffet, and fast food.  Limit fried foods.  Cook foods using methods other than frying.  Limit saturated fats.  Check ingredient lists to avoid foods with partially hydrogenated oils (trans fats) in them. What  foods can I eat? Grains Whole grains, such as whole wheat or whole grain breads, crackers, cereals, and pasta. Unsweetened oatmeal, bulgur, barley, quinoa, or brown rice. Corn or whole wheat flour tortillas. Vegetables Fresh or frozen vegetables (raw, steamed, roasted, or grilled). Green salads. Fruits All fresh, canned (in natural juice), or frozen fruits. Meat and Other Protein Products Ground beef (85% or leaner), grass-fed beef, or beef trimmed of fat. Skinless chicken or turkey. Ground chicken or turkey. Pork trimmed of fat. All fish and seafood. Eggs. Dried beans, peas, or lentils. Unsalted nuts or seeds. Unsalted canned or dry beans. Dairy Low-fat dairy products, such as skim or 1% milk, 2% or reduced-fat cheeses, low-fat ricotta or cottage cheese, or plain low-fat yogurt. Fats and Oils Tub margarines without trans fats. Light or reduced-fat mayonnaise and salad dressings. Avocado. Safflower, olive, or canola oils. Natural peanut or almond butter. The items listed above may not be a complete list of recommended foods or beverages. Contact your dietitian for more options. What foods are not recommended? Grains White bread. White pasta. White rice. Cornbread. Bagels, pastries, and croissants. Crackers that contain trans fat. Vegetables White potatoes. Corn. Creamed or fried vegetables. Vegetables in a cheese sauce. Fruits Dried fruits. Canned fruit in light or heavy syrup. Fruit juice. Meat and Other Protein Products Fatty cuts of meat. Ribs, chicken wings, bacon, sausage, bologna, salami, chitterlings, fatback, hot dogs, bratwurst, and packaged luncheon meats. Dairy Whole or 2% milk, cream, half-and-half, and cream cheese. Whole-fat or sweetened yogurt. Full-fat cheeses. Nondairy   milk, cream, half-and-half, and cream cheese. Whole-fat or sweetened yogurt. Full-fat cheeses. Nondairy creamers and whipped toppings. Processed cheese, cheese spreads, or cheese curds. Sweets and Desserts  Corn syrup, sugars, honey, and molasses. Candy. Jam and jelly. Syrup. Sweetened  cereals. Cookies, pies, cakes, donuts, muffins, and ice cream. Fats and Oils  Butter, stick margarine, lard, shortening, ghee, or bacon fat. Coconut, palm kernel, or palm oils. Beverages  Alcohol. Sweetened drinks (such as sodas, lemonade, and fruit drinks or punches). The items listed above may not be a complete list of foods and beverages to avoid. Contact your dietitian for more information.  This information is not intended to replace advice given to you by your health care provider. Make sure you discuss any questions you have with your health care provider. Document Released: 07/02/2004 Document Revised: 02/20/2016 Document Reviewed: 07/19/2013 Elsevier Interactive Patient Education  2017 Elsevier Inc.  Breast Tenderness Breast tenderness is a common problem for women of all ages. Breast tenderness may cause mild discomfort to severe pain. The pain usually comes and goes in association with your menstrual cycle, but it can be constant. Breast tenderness has many possible causes, including hormone changes and some medicines. Your health care provider may order tests, such as a mammogram or an ultrasound, to check for any unusual findings. Having breast tenderness usually does not mean that you have breast cancer. Follow these instructions at home: Sometimes, reassurance that you do not have breast cancer is all that is needed. In general, follow these home care instructions: Managing pain and discomfort  If directed, apply ice to the area:  Put ice in a plastic bag.  Place a towel between your skin and the bag.  Leave the ice on for 20 minutes, 2-3 times a day.  Make sure you are wearing a supportive bra, especially during exercise. You may also want to wear a supportive bra while sleeping if your breasts are very tender. Medicines  Take over-the-counter and prescription medicines only as told by your health care provider. If the cause of your pain is infection, you may be prescribed  an antibiotic medicine.  If you were prescribed an antibiotic, take it as told by your health care provider. Do not stop taking the antibiotic even if you start to feel better. General instructions  Your health care provider may recommend that you reduce the amount of fat in your diet. You can do this by:  Limiting fried foods.  Cooking foods using methods, such as baking, boiling, grilling, and broiling.  Decrease the amount of caffeine in your diet. You can do this by drinking more water and choosing caffeine-free options.  Keep a log of the days and times when your breasts are most tender.  Ask your health care provider how to do breast exams at home. This will help you notice if you have an unusual growth or lump. Contact a health care provider if:  Any part of your breast is hard, red, and hot to the touch. This may be a sign of infection.  You are not breastfeeding and you have fluid, especially blood or pus, coming out of your nipples.  You have a fever.  You have a new or painful lump in your breast that remains after your menstrual period ends.  Your pain does not improve or it gets worse.  Your pain is interfering with your daily activities. This information is not intended to replace advice given to you by your health care provider. Make sure you  discuss any questions you have with your health care provider. Document Released: 08/27/2008 Document Revised: 06/12/2016 Document Reviewed: 06/12/2016 Elsevier Interactive Patient Education  2017 [employer].

## 2016-10-27 LAB — COMPREHENSIVE METABOLIC PANEL
ALT: 8 IU/L (ref 0–32)
AST: 11 IU/L (ref 0–40)
Albumin/Globulin Ratio: 1.8 (ref 1.2–2.2)
Albumin: 4.2 g/dL (ref 3.5–5.5)
Alkaline Phosphatase: 123 IU/L — ABNORMAL HIGH (ref 39–117)
BUN/Creatinine Ratio: 21 (ref 9–23)
BUN: 11 mg/dL (ref 6–24)
Bilirubin Total: <0.2 mg/dL (ref 0.0–1.2)
CO2: 26 mmol/L (ref 18–29)
Calcium: 9 mg/dL (ref 8.7–10.2)
Chloride: 102 mmol/L (ref 96–106)
Creatinine, Ser: 0.52 mg/dL — ABNORMAL LOW (ref 0.57–1.00)
GFR calc Af Amer: 130 mL/min/{1.73_m2} (ref >59)
GFR calc non Af Amer: 113 mL/min/{1.73_m2} (ref >59)
Globulin, Total: 2.4 g/dL (ref 1.5–4.5)
Glucose: 88 mg/dL (ref 65–99)
Potassium: 3.9 mmol/L (ref 3.5–5.2)
Sodium: 141 mmol/L (ref 134–144)
Total Protein: 6.6 g/dL (ref 6.0–8.5)

## 2016-10-27 LAB — CBC
Hematocrit: 37.3 % (ref 34.0–46.6)
Hemoglobin: 12.5 g/dL (ref 11.1–15.9)
MCH: 27.9 pg (ref 26.6–33.0)
MCHC: 33.5 g/dL (ref 31.5–35.7)
MCV: 83 fL (ref 79–97)
Platelets: 331 10*3/uL (ref 150–379)
RBC: 4.48 x10E6/uL (ref 3.77–5.28)
RDW: 14.4 % (ref 12.3–15.4)
WBC: 8.4 10*3/uL (ref 3.4–10.8)

## 2016-10-27 LAB — HEMOGLOBIN A1C
Est. average glucose Bld gHb Est-mCnc: 97 mg/dL
Hgb A1c MFr Bld: 5 % (ref 4.8–5.6)

## 2016-10-27 LAB — FERRITIN: Ferritin: 26 ng/mL (ref 15–150)

## 2016-10-28 ENCOUNTER — Other Ambulatory Visit: Payer: Self-pay | Admitting: Family Medicine

## 2016-10-30 ENCOUNTER — Encounter: Payer: Self-pay | Admitting: Family Medicine

## 2016-11-15 DIAGNOSIS — R05 Cough: Secondary | ICD-10-CM | POA: Diagnosis not present

## 2016-11-15 DIAGNOSIS — J209 Acute bronchitis, unspecified: Secondary | ICD-10-CM | POA: Diagnosis not present

## 2016-11-18 ENCOUNTER — Ambulatory Visit (INDEPENDENT_AMBULATORY_CARE_PROVIDER_SITE_OTHER): Payer: Commercial Managed Care - HMO | Admitting: Emergency Medicine

## 2016-11-18 ENCOUNTER — Ambulatory Visit (INDEPENDENT_AMBULATORY_CARE_PROVIDER_SITE_OTHER): Payer: Commercial Managed Care - HMO

## 2016-11-18 VITALS — BP 134/84 | HR 78 | Temp 98.5°F | Resp 16 | Ht 62.0 in | Wt 170.0 lb

## 2016-11-18 DIAGNOSIS — R05 Cough: Secondary | ICD-10-CM

## 2016-11-18 DIAGNOSIS — R091 Pleurisy: Secondary | ICD-10-CM

## 2016-11-18 DIAGNOSIS — R059 Cough, unspecified: Secondary | ICD-10-CM

## 2016-11-18 DIAGNOSIS — J111 Influenza due to unidentified influenza virus with other respiratory manifestations: Secondary | ICD-10-CM

## 2016-11-18 DIAGNOSIS — J209 Acute bronchitis, unspecified: Secondary | ICD-10-CM

## 2016-11-18 LAB — POCT CBC
Granulocyte percent: 63 %G (ref 37–80)
HCT, POC: 41.2 % (ref 37.7–47.9)
Hemoglobin: 14.3 g/dL (ref 12.2–16.2)
Lymph, poc: 1.7 (ref 0.6–3.4)
MCH, POC: 28.3 pg (ref 27–31.2)
MCHC: 34.8 g/dL (ref 31.8–35.4)
MCV: 81.2 fL (ref 80–97)
MID (cbc): 0.6 (ref 0–0.9)
MPV: 6.2 fL (ref 0–99.8)
POC Granulocyte: 4 (ref 2–6.9)
POC LYMPH PERCENT: 27.2 %L (ref 10–50)
POC MID %: 9.8 %M (ref 0–12)
Platelet Count, POC: 313 10*3/uL (ref 142–424)
RBC: 5.08 M/uL (ref 4.04–5.48)
RDW, POC: 12.9 %
WBC: 6.3 10*3/uL (ref 4.6–10.2)

## 2016-11-18 MED ORDER — PROMETHAZINE-DM 6.25-15 MG/5ML PO SYRP: 5.0000 mL | ORAL_SOLUTION | Freq: Four times a day (QID) | ORAL | 0 refills | Status: AC | PRN

## 2016-11-18 MED ORDER — AZITHROMYCIN 250 MG PO TABS: ORAL_TABLET | ORAL | 0 refills | Status: DC

## 2016-11-18 MED ORDER — PREDNISONE 20 MG PO TABS: 40.0000 mg | ORAL_TABLET | Freq: Every day | ORAL | 0 refills | Status: AC

## 2016-11-18 NOTE — Patient Instructions (Addendum)
     IF you received an x-ray today, you will receive an invoice from [facility]. Please contact [facility] at [phone] with questions or concerns regarding your invoice.   IF you received labwork today, you will receive an invoice from [facility]. Please contact [facility] at [phone] with questions or concerns regarding your invoice.   Our billing staff will not be able to assist you with questions regarding bills from these companies.  You will be contacted with the lab results as soon as they are available. The fastest way to get your results is to activate your My Chart account. Instructions are located on the last page of this paperwork. If you have not heard from us regarding the results in 2 weeks, please contact this office.      Acute Bronchitis, Adult Acute bronchitis is when air tubes (bronchi) in the lungs suddenly get swollen. The condition can make it hard to breathe. It can also cause these symptoms:  A cough.  Coughing up clear, yellow, or green mucus.  Wheezing.  Chest congestion.  Shortness of breath.  A fever.  Body aches.  Chills.  A sore throat.  Follow these instructions at home: Medicines  Take over-the-counter and prescription medicines only as told by your doctor.  If you were prescribed an antibiotic medicine, take it as told by your doctor. Do not stop taking the antibiotic even if you start to feel better. General instructions  Rest.  Drink enough fluids to keep your pee (urine) clear or pale yellow.  Avoid smoking and secondhand smoke. If you smoke and you need help quitting, ask your doctor. Quitting will help your lungs heal faster.  Use an inhaler, cool mist vaporizer, or humidifier as told by your doctor.  Keep all follow-up visits as told by your doctor. This is important. How is this prevented? To lower your risk of getting this condition again:  Wash your hands often with soap and water. If you cannot  use soap and water, use hand sanitizer.  Avoid contact with people who have cold symptoms.  Try not to touch your hands to your mouth, nose, or eyes.  Make sure to get the flu shot every year.  Contact a doctor if:  Your symptoms do not get better in 2 weeks. Get help right away if:  You cough up blood.  You have chest pain.  You have very bad shortness of breath.  You become dehydrated.  You faint (pass out) or keep feeling like you are going to pass out.  You keep throwing up (vomiting).  You have a very bad headache.  Your fever or chills gets worse. This information is not intended to replace advice given to you by your health care provider. Make sure you discuss any questions you have with your health care provider. Document Released: 03/02/2008 Document Revised: 04/22/2016 Document Reviewed: 03/04/2016 Elsevier Interactive Patient Education  2017 Elsevier Inc.  

## 2016-11-18 NOTE — Progress Notes (Signed)
Rachel Roth 51 y.o.   Chief Complaint  Patient presents with  . Cough    Only productive in the , cause chest discomfort  . Excessive Sweating    HISTORY OF PRESENT ILLNESS: This is a 51 y.o. female complaining of flu symptoms since last Saturday; seen at [facility] and started on Tamiflu, Tessalon, and Albuterol inhaler. Still coughing a lot and now c/o chest tightness worse with coughing. No cardiac Hx and no DM Hx. Still running intermittent fevers with sweating.  HPI   Prior to Admission medications   Medication Sig Start Date End Date Taking? Authorizing Provider  Benzonatate (TESSALON PO) Take by mouth.   Yes Historical Provider, MD  gabapentin (NEURONTIN) 300 MG capsule Take 300 mg by mouth daily.   Yes Historical Provider, MD  GuaiFENesin (MUCINEX PO) Take by mouth.   Yes Historical Provider, MD  meloxicam (MOBIC) 15 MG tablet Take 15 mg by mouth daily.   Yes Historical Provider, MD  Oseltamivir Phosphate (TAMIFLU PO) Take by mouth.   Yes Historical Provider, MD  venlafaxine XR (EFFEXOR-XR) 75 MG 24 hr capsule Take 3 capsules (225 mg total) by mouth daily with breakfast. 10/26/16  Yes Shawnee Knapp, MD    Allergies  Allergen Reactions  . Codeine Nausea And Vomiting    Patient Active Problem List   Diagnosis Date Noted  . Arthritis 10/25/2016  . RLS (restless legs syndrome) 03/19/2015  . Blood pressure elevated without history of HTN 11/01/2013  . Lower back pain 07/09/2012  . Hip pain 07/09/2012  . Cervical radiculopathy 05/29/2012  . Anxiety 05/29/2012  . Insomnia 05/29/2012  . BMI 31.0-31.9,adult 05/29/2012  . Depression 12/17/2011    Past Medical History:  Diagnosis Date  . Allergy   . Arthritis     Past Surgical History:  Procedure Laterality Date  . ABDOMINAL HYSTERECTOMY    . CESAREAN SECTION      Social History   Social History  . Marital status: Married    Spouse name: N/A  . Number of children: N/A  . Years of education: N/A    Occupational History  . Not on file.   Social History Main Topics  . Smoking status: Former Research scientist (life sciences)  . Smokeless tobacco: Never Used  . Alcohol use No  . Drug use: No  . Sexual activity: Yes    Birth control/ protection: None   Other Topics Concern  . Not on file   Social History Narrative  . No narrative on file    Family History  Problem Relation Age of Onset  . Diabetes Mother   . Cancer Father 74    Lung cancer  . Diabetes Sister   . Diabetes Brother   . Diabetes Sister      Review of Systems  Constitutional: Positive for chills, diaphoresis, fever and malaise/fatigue.  HENT: Positive for congestion. Negative for nosebleeds and sore throat.   Eyes: Negative for discharge and redness.  Respiratory: Positive for cough. Negative for hemoptysis, shortness of breath and wheezing.   Cardiovascular: Positive for chest pain (pleuritic). Negative for leg swelling.  Gastrointestinal: Negative for abdominal pain, diarrhea, nausea and vomiting.  Genitourinary: Negative for dysuria and hematuria.  Musculoskeletal: Positive for myalgias (generalized achiness). Negative for back pain and neck pain.  Skin: Negative for rash.  Neurological: Positive for weakness and headaches. Negative for dizziness, sensory change and focal weakness.  All other systems reviewed and are negative.   Vitals:   11/18/16 0949  BP:  134/84  Pulse: 78  Resp: 16  Temp: 98.5 F (36.9 C)    Physical Exam  Constitutional: She is oriented to person, place, and time. She appears well-developed and well-nourished.  HENT:  Head: Normocephalic and atraumatic.  Nose: Nose normal.  Mouth/Throat: Oropharynx is clear and moist. No oropharyngeal exudate.  Eyes: Conjunctivae and EOM are normal. Pupils are equal, round, and reactive to light.  Neck: Normal range of motion. Neck supple. No JVD present. No thyromegaly present.  Cardiovascular: Normal rate, regular rhythm and normal heart sounds.    Pulmonary/Chest: Effort normal and breath sounds normal.  Abdominal: Soft. Bowel sounds are normal. She exhibits no distension. There is no tenderness.  Musculoskeletal: Normal range of motion.  Lymphadenopathy:    She has no cervical adenopathy.  Neurological: She is alert and oriented to person, place, and time. No sensory deficit. She exhibits normal muscle tone.  Skin: Skin is warm and dry. Capillary refill takes less than 2 seconds.  Psychiatric: She has a normal mood and affect. Her behavior is normal.  Vitals reviewed.  EKG: NSR, no acute ischemic changes. CXR: Bilateral peribronchial cuffing c/w bronchitis. CBC: WNL  ASSESSMENT & PLAN: Rachel Roth was seen today for cough and excessive sweating.  Diagnoses and all orders for this visit:  Influenza with respiratory manifestation -     POCT CBC  Cough -     DG Chest 2 View; Future  Pleurisy -     EKG 12-Lead  Acute bronchitis, unspecified organism  Other orders -     azithromycin (ZITHROMAX) 250 MG tablet; Sig as indicated -     predniSONE (DELTASONE) 20 MG tablet; Take 2 tablets (40 mg total) by mouth daily with breakfast. -     promethazine-dextromethorphan (PROMETHAZINE-DM) 6.25-15 MG/5ML syrup; Take 5 mLs by mouth 4 (four) times daily as needed for cough.    Patient Instructions       IF you received an x-ray today, you will receive an invoice from [facility]. Please contact [facility] at [phone] with questions or concerns regarding your invoice.   IF you received labwork today, you will receive an invoice from [place]. Please contact [facility] at [phone] with questions or concerns regarding your invoice.   Our billing staff will not be able to assist you with questions regarding bills from these companies.  You will be contacted with the lab results as soon as they are available. The fastest way to get your results is to activate your My Chart account. Instructions are located  on the last page of this paperwork. If you have not heard from [place] regarding the results in 2 weeks, please contact this office.     Acute Bronchitis, Adult Acute bronchitis is when air tubes (bronchi) in the lungs suddenly get swollen. The condition can make it hard to breathe. It can also cause these symptoms:  A cough.  Coughing up clear, yellow, or green mucus.  Wheezing.  Chest congestion.  Shortness of breath.  A fever.  Body aches.  Chills.  A sore throat. Follow these instructions at home: Medicines  Take over-the-counter and prescription medicines only as told by your doctor.  If you were prescribed an antibiotic medicine, take it as told by your doctor. Do not stop taking the antibiotic even if you start to feel better. General instructions  Rest.  Drink enough fluids to keep your pee (urine) clear or pale yellow.  Avoid smoking and secondhand smoke. If you smoke and you need help  quitting, ask your doctor. Quitting will help your lungs heal faster.  Use an inhaler, cool mist vaporizer, or humidifier as told by your doctor.  Keep all follow-up visits as told by your doctor. This is important. How is this prevented? To lower your risk of getting this condition again:  Wash your hands often with soap and water. If you cannot use soap and water, use hand sanitizer.  Avoid contact with people who have cold symptoms.  Try not to touch your hands to your mouth, nose, or eyes.  Make sure to get the flu shot every year. Contact a doctor if:  Your symptoms do not get better in 2 weeks. Get help right away if:  You cough up blood.  You have chest pain.  You have very bad shortness of breath.  You become dehydrated.  You faint (pass out) or keep feeling like you are going to pass out.  You keep throwing up (vomiting).  You have a very bad headache.  Your fever or chills gets worse. This information is not intended to replace advice given to you by  your health care provider. Make sure you discuss any questions you have with your health care provider. Document Released: 03/02/2008 Document Revised: 04/22/2016 Document Reviewed: 03/04/2016 Elsevier Interactive Patient Education  2017 Elsevier Inc.      Agustina Caroli, MD [facility]

## 2016-11-26 DIAGNOSIS — N644 Mastodynia: Secondary | ICD-10-CM | POA: Diagnosis not present

## 2016-11-26 LAB — HM MAMMOGRAPHY

## 2016-12-01 ENCOUNTER — Telehealth: Payer: Self-pay | Admitting: *Deleted

## 2016-12-01 MED ORDER — GABAPENTIN 300 MG PO CAPS: 300.0000 mg | ORAL_CAPSULE | Freq: Every day | ORAL | 3 refills | Status: DC

## 2016-12-01 NOTE — Telephone Encounter (Signed)
Request for Gabapentin 300 mg ?

## 2016-12-01 NOTE — Telephone Encounter (Signed)
Sent in

## 2017-01-12 ENCOUNTER — Other Ambulatory Visit: Payer: Self-pay | Admitting: Family Medicine

## 2017-01-12 DIAGNOSIS — M25511 Pain in right shoulder: Secondary | ICD-10-CM

## 2017-01-12 DIAGNOSIS — M25551 Pain in right hip: Secondary | ICD-10-CM

## 2017-01-12 DIAGNOSIS — M5412 Radiculopathy, cervical region: Secondary | ICD-10-CM

## 2017-01-15 ENCOUNTER — Other Ambulatory Visit: Payer: Self-pay | Admitting: Family Medicine

## 2017-01-15 DIAGNOSIS — M25511 Pain in right shoulder: Secondary | ICD-10-CM

## 2017-01-15 DIAGNOSIS — M25551 Pain in right hip: Secondary | ICD-10-CM

## 2017-01-15 DIAGNOSIS — M5412 Radiculopathy, cervical region: Secondary | ICD-10-CM

## 2017-02-16 ENCOUNTER — Encounter: Payer: Self-pay | Admitting: Family Medicine

## 2017-02-16 ENCOUNTER — Ambulatory Visit (INDEPENDENT_AMBULATORY_CARE_PROVIDER_SITE_OTHER): Payer: Commercial Managed Care - HMO

## 2017-02-16 ENCOUNTER — Ambulatory Visit (INDEPENDENT_AMBULATORY_CARE_PROVIDER_SITE_OTHER): Payer: Commercial Managed Care - HMO | Admitting: Family Medicine

## 2017-02-16 VITALS — BP 140/88 | HR 77 | Temp 98.1°F | Resp 18 | Ht 62.0 in | Wt 169.2 lb

## 2017-02-16 DIAGNOSIS — Z791 Long term (current) use of non-steroidal anti-inflammatories (NSAID): Secondary | ICD-10-CM | POA: Diagnosis not present

## 2017-02-16 DIAGNOSIS — M25561 Pain in right knee: Secondary | ICD-10-CM

## 2017-02-16 DIAGNOSIS — I10 Essential (primary) hypertension: Secondary | ICD-10-CM

## 2017-02-16 MED ORDER — LIDOCAINE-EPINEPHRINE 1 %-1:100000 IJ SOLN: 2.0000 mL | Freq: Once | INTRAMUSCULAR | Status: DC

## 2017-02-16 MED ORDER — TRIAMCINOLONE ACETONIDE 40 MG/ML IJ SUSP: 40.0000 mg | Freq: Once | INTRAMUSCULAR | Status: DC

## 2017-02-16 NOTE — Progress Notes (Signed)
Chief Complaint  Patient presents with  . Knee Pain    right knee pain x2 weeks and getting worse    HPI Acute Right Knee Pain Pt reports that she has medial aspect right knee pain for 2 weeks States that it started unprovoked Each day it is getting worse She reports that the knee pain is there all day Her knee pain is aggravated by long standing, certain pivoting movements and sitting for longer periods of time, going up and down stairs but worse with going up stairs She denies weakness She would rate her pain 7/10 She takes meloxicam every day with tylenol  There has been no history of problems with this right knee She denies a swollen appearance but it feels tight, stiff and swollen   Elevated bp without diagnosis of hypertension Pt reports that she does not have bp problems She states that she does not have any chest pains or palpitations BP Readings from Last 3 Encounters:  02/16/17 140/88  11/18/16 134/84  10/26/16 (!) 144/81    Past Medical History:  Diagnosis Date  . Allergy   . Arthritis     Current Outpatient Prescriptions  Medication Sig Dispense Refill  . gabapentin (NEURONTIN) 300 MG capsule Take 1 capsule (300 mg total) by mouth at bedtime. 90 capsule 3  . meloxicam (MOBIC) 15 MG tablet Take 1 tablet (15 mg total) by mouth daily. Prn pain 90 tablet 1  . venlafaxine XR (EFFEXOR-XR) 75 MG 24 hr capsule Take 3 capsules (225 mg total) by mouth daily with breakfast. 270 capsule 3   Current Facility-Administered Medications  Medication Dose Route Frequency Provider Last Rate Last Dose  . lidocaine-EPINEPHrine (XYLOCAINE W/EPI) 1 %-1:100000 (with pres) injection 2 mL  2 mL Other Once Izetta Sakamoto A, MD      . triamcinolone acetonide (KENALOG-40) injection 40 mg  40 mg Intramuscular Once Forrest Moron, MD        Allergies:  Allergies  Allergen Reactions  . Codeine Nausea And Vomiting    Past Surgical History:  Procedure Laterality Date  . ABDOMINAL  HYSTERECTOMY    . CESAREAN SECTION      Social History   Social History  . Marital status: Married    Spouse name: N/A  . Number of children: N/A  . Years of education: N/A   Social History Main Topics  . Smoking status: Former Research scientist (life sciences)  . Smokeless tobacco: Never Used  . Alcohol use No  . Drug use: No  . Sexual activity: Yes    Birth control/ protection: None   Other Topics Concern  . None   Social History Narrative  . None    Review of Systems  Constitutional: Negative for chills and fever.  Musculoskeletal: Positive for joint pain. Negative for myalgias.  Neurological: Negative for dizziness and focal weakness.    Objective: Vitals:   02/16/17 1531 02/16/17 1653  BP: (!) 150/81 140/88  Pulse: 77   Resp: 18   Temp: 98.1 F (36.7 C)   TempSrc: Oral   SpO2: 97%   Weight: 169 lb 3.2 oz (76.7 kg)   Height: 5\' 2"  (1.575 m)     Physical Exam  Constitutional: She is oriented to person, place, and time. She appears well-developed and well-nourished.  HENT:  Head: Normocephalic and atraumatic.  Eyes: Conjunctivae and EOM are normal.  Cardiovascular: Normal rate, regular rhythm and normal heart sounds.   Pulmonary/Chest: Effort normal and breath sounds normal. No respiratory distress. She has  no wheezes.  Neurological: She is alert and oriented to person, place, and time.  Skin: Capillary refill takes less than 2 seconds.   Right knee exam Contralateral knee normal Medial joint line tenderness Anterior draw negative Apprehension test negative Patellar exam normal No laxity, no effusion  XRAY KNEE Right knee normal  Procedure Note Procedure reviewed Question solicited and answered Verbal consent give Medial aspect of the right knee cleaned with betadine Topical anesthetic applied Kenalog 40mg  with Lidocaine  Pt tolerated well   Assessment and Plan Sarita was seen today for knee pain.  Diagnoses and all orders for this visit:  Acute pain of  right knee- likely patellofemoral and some ligamentous injury Knee injection with steroid today Follow up with PT Follow up in clinic in on month Normal renal function but elevation in bp  Advised to add tylenol arthritis and less meloxicam -     DG Knee Complete 4 Views Right -     triamcinolone acetonide (KENALOG-40) injection 40 mg; Inject 1 mL (40 mg total) into the muscle once. -     lidocaine-EPINEPHrine (XYLOCAINE W/EPI) 1 %-1:100000 (with pres) injection 2 mL; 2 mLs by Other route once. -     Ambulatory referral to Physical Therapy  Newly diagnosed hypertension- diagnosed today based on multiple readings of sbp>140 and dbp>90 Decrease NSAIDs and use tylenol arthritis and heat Will follow up in one month DASH diet  NSAID long-term use- normal renal function but increaing bp Pt should add tylenol arthritis     Gaspare Netzel A Nolon Rod

## 2017-02-16 NOTE — Patient Instructions (Addendum)
     IF you received an x-ray today, you will receive an invoice from [facility]. Please contact [facility] at [phone] with questions or concerns regarding your invoice.   IF you received labwork today, you will receive an invoice from [place]. Please contact [facility] at [phone] with questions or concerns regarding your invoice.   Our billing staff will not be able to assist you with questions regarding bills from these companies.  You will be contacted with the lab results as soon as they are available. The fastest way to get your results is to activate your My Chart account. Instructions are located on the last page of this paperwork. If you have not heard from [place] regarding the results in 2 weeks, please contact this office.     Knee Injection, Care After Refer to this sheet in the next few weeks. These instructions provide you with information about caring for yourself after your procedure. Your health care provider may also give you more specific instructions. Your treatment has been planned according to current medical practices, but problems sometimes occur. Call your health care provider if you have any problems or questions after your procedure. What can I expect after the procedure? After the procedure, it is common to have:  Soreness.  Warmth.  Swelling. You may have more pain, swelling, and warmth than you did before the injection. This reaction may last for about one day. Follow these instructions at home: Bathing   If you were given a bandage (dressing), keep it dry until your health care provider says it can be removed. Ask your health care provider when you can start showering or taking a bath. Managing pain, stiffness, and swelling   If directed, apply ice to the injection area:  Put ice in a plastic bag.  Place a towel between your skin and the bag.  Leave the ice on for 20 minutes, 2-3 times per day.  Do not apply heat to your  knee.  Raise the injection area above the level of your heart while you are sitting or lying down. Activity   Avoid strenuous activities for as long as directed by your health care provider. Ask your health care provider when you can return to your normal activities. General instructions   Take medicines only as directed by your health care provider.  Do not take aspirin or other over-the-counter medicines unless your health care provider says you can.  Check your injection site every day for signs of infection. Watch for:  Redness, swelling, or pain.  Fluid, blood, or pus.  Follow your health care provider's instructions about dressing changes and removal. Contact a health care provider if:  You have symptoms at your injection site that last longer than two days after your procedure.  You have redness, swelling, or pain in your injection area.  You have fluid, blood, or pus coming from your injection site.  You have warmth in your injection area.  You have a fever.  Your pain is not controlled with medicine. Get help right away if:  Your knee turns very red.  Your knee becomes very swollen.  Your knee pain is severe. This information is not intended to replace advice given to you by your health care provider. Make sure you discuss any questions you have with your health care provider. Document Released: 10/05/2014 Document Revised: 05/20/2016 Document Reviewed: 07/25/2014 Elsevier Interactive Patient Education  2017 [employer].

## 2017-03-24 ENCOUNTER — Ambulatory Visit (INDEPENDENT_AMBULATORY_CARE_PROVIDER_SITE_OTHER): Payer: 59 | Admitting: Family Medicine

## 2017-03-24 ENCOUNTER — Encounter: Payer: Self-pay | Admitting: Family Medicine

## 2017-03-24 VITALS — BP 128/77 | HR 69 | Temp 98.0°F | Resp 16 | Ht 62.0 in | Wt 164.8 lb

## 2017-03-24 DIAGNOSIS — M25561 Pain in right knee: Secondary | ICD-10-CM

## 2017-03-24 DIAGNOSIS — R29898 Other symptoms and signs involving the musculoskeletal system: Secondary | ICD-10-CM

## 2017-03-24 DIAGNOSIS — Z791 Long term (current) use of non-steroidal anti-inflammatories (NSAID): Secondary | ICD-10-CM

## 2017-03-24 DIAGNOSIS — R030 Elevated blood-pressure reading, without diagnosis of hypertension: Secondary | ICD-10-CM

## 2017-03-24 DIAGNOSIS — R195 Other fecal abnormalities: Secondary | ICD-10-CM

## 2017-03-24 NOTE — Progress Notes (Signed)
Chief Complaint  Patient presents with  . Pain    right knee, no problems- alot better    HPI  Patient is here to follow up on right knee pain  She was last seen on 02/16/2017  Acute Right Knee Pain- 02/16/17 Pt reports that she has medial aspect right knee pain for 2 weeks States that it started unprovoked Each day it is getting worse She reports that the knee pain is there all day Her knee pain is aggravated by long standing, certain pivoting movements and sitting for longer periods of time, going up and down stairs but worse with going up stairs She denies weakness She would rate her pain 7/10 She takes meloxicam every day with tylenol  There has been no history of problems with this right knee She denies a swollen appearance but it feels tight, stiff and swollen  Study Result   CLINICAL DATA:  Acute Right knee pain  EXAM: RIGHT KNEE - COMPLETE 4+ VIEW  COMPARISON:  None.  FINDINGS: No evidence of fracture, dislocation, or joint effusion. No evidence of arthropathy or other focal bone abnormality. Soft tissues are unremarkable.  IMPRESSION: Negative.   Electronically Signed   By: Franchot Gallo M.D.   On: 02/16/2017 16:25     Right knee pain Interval history-  Since that office visit she has not been having any pain and has stopped meloxicam She only takes ibuprofen  For her elevated bp She no longer takes any nsaids which she was taking daily.  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  She denies headache.    Past Medical History:  Diagnosis Date  . Allergy   . Arthritis     Current Outpatient Prescriptions  Medication Sig Dispense Refill  . venlafaxine XR (EFFEXOR-XR) 75 MG 24 hr capsule Take 3  capsules (225 mg total) by mouth daily with breakfast. 270 capsule 3  . gabapentin (NEURONTIN) 300 MG capsule Take 1 capsule (300 mg total) by mouth at bedtime. 90 capsule 3   Current Facility-Administered Medications  Medication Dose Route Frequency Provider Last Rate Last Dose  . lidocaine-EPINEPHrine (XYLOCAINE W/EPI) 1 %-1:100000 (with pres) injection 2 mL  2 mL Other Once Maresa Morash A, MD      . triamcinolone acetonide (KENALOG-40) injection 40 mg  40 mg Intramuscular Once Forrest Moron, MD        Allergies:  Allergies  Allergen Reactions  . Codeine Nausea And Vomiting  . Meloxicam Hypertension    Past Surgical History:  Procedure Laterality Date  . ABDOMINAL HYSTERECTOMY    . CESAREAN SECTION      Social History   Social History  . Marital status: Married    Spouse name: N/A  . Number of children: N/A  . Years of education: N/A   Social History Main Topics  . Smoking status: Former Research scientist (life sciences)  . Smokeless tobacco: Never Used  . Alcohol use No  . Drug use: No  . Sexual activity: Yes    Birth control/ protection: None   Other Topics Concern  . None   Social History Narrative  . None    ROS See hpi  Objective: Vitals:   03/24/17 1638  BP: 128/77  Pulse:  69  Resp: 16  Temp: 98 F (36.7 C)  TempSrc: Oral  SpO2: 100%  Weight: 164 lb 12.8 oz (74.8 kg)  Height: 5\' 2"  (1.575 m)    Physical Exam General: alert, oriented, in NAD Head: normocephalic, atraumatic, no sinus tenderness Eyes: EOM intact, no scleral icterus or conjunctival injection Ears: TM clear bilaterally Jaw: right TMJ with clicks with opening and closing of mouth Heart: normal rate, normal sinus rhythm, no murmurs Lungs: clear to auscultation bilaterally, no wheezing  Right knee non-tender No effusion No crepitus  Assessment and Plan Everest was seen today for pain.  Diagnoses and all orders for this visit:  Blood pressure elevated without history of HTN- bp normalized  after stopping meloxicam  NSAID long-term use -   Discontinued meloxicam and doing tylenol for pain prn  Acute pain of right knee- improved   TMJ click- follow up dentist for xrays and evaluation  Discussed applying heat She should get assessed for grinding of the teeth  Abnormal stool caliber- will refer to [place]

## 2017-03-24 NOTE — Patient Instructions (Addendum)
IF you received an x-ray today, you will receive an invoice from [facility]. Please contact [facility] at [phone] with questions or concerns regarding your invoice.   IF you received labwork today, you will receive an invoice from [place]. Please contact [facility] at [phone] with questions or concerns regarding your invoice.   Our billing staff will not be able to assist you with questions regarding bills from these companies.  You will be contacted with the lab results as soon as they are available. The fastest way to get your results is to activate your My Chart account. Instructions are located on the last page of this paperwork. If you have not heard from [place] regarding the results in 2 weeks, please contact this office.     Musculoskeletal Pain Musculoskeletal pain is muscle and bone aches and pains. This pain can occur in any part of the body. Follow these instructions at home:  Only take medicines for pain, discomfort, or fever as told by your health care provider.  You may continue all activities unless the activities cause more pain. When the pain lessens, slowly resume normal activities. Gradually increase the intensity and duration of the activities or exercise.  During periods of severe pain, bed rest may be helpful. Lie or sit in any position that is comfortable, but get out of bed and walk around at least every several hours.  If directed, put ice on the injured area. ? Put ice in a plastic bag. ? Place a towel between your skin and the bag. ? Leave the ice on for 20 minutes, 2-3 times a day. Contact a health care provider if:  Your pain is getting worse.  Your pain is not relieved with medicines.  You lose function in the area of the pain if the pain is in your arms, legs, or neck. This information is not intended to replace advice given to you by your health care provider. Make sure you discuss any questions you have with your health  care provider. Document Released: 09/14/2005 Document Revised: 02/25/2016 Document Reviewed: 05/19/2013 Elsevier Interactive Patient Education  2017 Elsevier Inc. Temporomandibular Joint Syndrome Temporomandibular joint (TMJ) syndrome is a condition that affects the joints between your jaw and your skull. The TMJs are located near your ears and allow your jaw to open and close. These joints and the nearby muscles are involved in all movements of the jaw. People with TMJ syndrome have pain in the area of these joints and muscles. Chewing, biting, or other movements of the jaw can be difficult or painful. TMJ syndrome can be caused by various things. In many cases, the condition is mild and goes away within a few weeks. For some people, the condition can become a long-term problem. What are the causes? Possible causes of TMJ syndrome include:  Grinding your teeth or clenching your jaw. Some people do this when they are under stress.  Arthritis.  Injury to the jaw.  Head or neck injury.  Teeth or dentures that are not aligned well.  In some cases, the cause of TMJ syndrome may not be known. What are the signs or symptoms? The most common symptom is an aching pain on the side of the head in the area of the TMJ. Other symptoms may include:  Pain when moving your jaw, such as when chewing or biting.  Being unable to open your jaw all the way.  Making a clicking sound when you open your mouth.  Headache.  Earache.  Neck or shoulder pain.  How is this diagnosed? Diagnosis can usually be made based on your symptoms, your medical history, and a physical exam. Your health care provider may check the range of motion of your jaw. Imaging tests, such as X-rays or an MRI, are sometimes done. You may need to see your dentist to determine if your teeth and jaw are lined up correctly. How is this treated? TMJ syndrome often goes away on its own. If treatment is needed, the options may  include:  Eating soft foods and applying ice or heat.  Medicines to relieve pain or inflammation.  Medicines to relax the muscles.  A splint, bite plate, or mouthpiece to prevent teeth grinding or jaw clenching.  Relaxation techniques or counseling to help reduce stress.  Transcutaneous electrical nerve stimulation (TENS). This helps to relieve pain by applying an electrical current through the skin.  Acupuncture. This is sometimes helpful to relieve pain.  Jaw surgery. This is rarely needed.  Follow these instructions at home:  Take medicines only as directed by your health care provider.  Eat a soft diet if you are having trouble chewing.  Apply ice to the painful area. ? Put ice in a plastic bag. ? Place a towel between your skin and the bag. ? Leave the ice on for 20 minutes, 2-3 times a day.  Apply a warm compress to the painful area as directed.  Massage your jaw area and perform any jaw stretching exercises as recommended by your health care provider.  If you were given a mouthpiece or bite plate, wear it as directed.  Avoid foods that require a lot of chewing. Do not chew gum.  Keep all follow-up visits as directed by your health care provider. This is important. Contact a health care provider if:  You are having trouble eating.  You have new or worsening symptoms. Get help right away if:  Your jaw locks open or closed. This information is not intended to replace advice given to you by your health care provider. Make sure you discuss any questions you have with your health care provider. Document Released: 06/09/2001 Document Revised: 05/14/2016 Document Reviewed: 04/19/2014 Elsevier Interactive Patient Education  [employer].

## 2017-04-20 DIAGNOSIS — R194 Change in bowel habit: Secondary | ICD-10-CM | POA: Diagnosis not present

## 2017-04-20 DIAGNOSIS — Z1211 Encounter for screening for malignant neoplasm of colon: Secondary | ICD-10-CM | POA: Diagnosis not present

## 2017-05-03 DIAGNOSIS — Z1211 Encounter for screening for malignant neoplasm of colon: Secondary | ICD-10-CM | POA: Diagnosis not present

## 2017-05-03 DIAGNOSIS — R194 Change in bowel habit: Secondary | ICD-10-CM | POA: Diagnosis not present

## 2017-08-13 DIAGNOSIS — Z23 Encounter for immunization: Secondary | ICD-10-CM | POA: Diagnosis not present

## 2017-08-31 ENCOUNTER — Other Ambulatory Visit: Payer: Self-pay | Admitting: Physician Assistant

## 2017-09-02 ENCOUNTER — Telehealth: Payer: Self-pay | Admitting: Family Medicine

## 2017-09-02 NOTE — Telephone Encounter (Signed)
Copied from [place] [id number]. Topic: Quick Communication - See Telephone Encounter >> Sep 02, 2017  8:46 AM Burnis Medin, NT wrote: CRM for notification. See Telephone encounter for: Pt. Is calling in to get a refill on venlafaxine XR (EFFEXOR-XR) 75 MG 24 hr capsule. Pt use [facility] in [place].   09/02/17.

## 2017-09-15 ENCOUNTER — Other Ambulatory Visit: Payer: Self-pay | Admitting: Physician Assistant

## 2017-10-02 ENCOUNTER — Other Ambulatory Visit: Payer: Self-pay | Admitting: Physician Assistant

## 2017-10-04 ENCOUNTER — Other Ambulatory Visit: Payer: Self-pay

## 2017-10-04 ENCOUNTER — Encounter: Payer: Self-pay | Admitting: Family Medicine

## 2017-10-04 ENCOUNTER — Ambulatory Visit: Payer: 59 | Admitting: Family Medicine

## 2017-10-04 VITALS — BP 122/80 | HR 97 | Temp 98.6°F | Resp 16 | Ht 62.0 in | Wt 167.0 lb

## 2017-10-04 DIAGNOSIS — F419 Anxiety disorder, unspecified: Secondary | ICD-10-CM

## 2017-10-04 DIAGNOSIS — F4321 Adjustment disorder with depressed mood: Secondary | ICD-10-CM | POA: Diagnosis not present

## 2017-10-04 DIAGNOSIS — Z5181 Encounter for therapeutic drug level monitoring: Secondary | ICD-10-CM

## 2017-10-04 DIAGNOSIS — F329 Major depressive disorder, single episode, unspecified: Secondary | ICD-10-CM

## 2017-10-04 DIAGNOSIS — F32A Depression, unspecified: Secondary | ICD-10-CM

## 2017-10-04 MED ORDER — VENLAFAXINE HCL ER 75 MG PO CP24: 225.0000 mg | ORAL_CAPSULE | Freq: Every day | ORAL | 3 refills | Status: DC

## 2017-10-04 NOTE — Patient Instructions (Addendum)
IF you received an x-ray today, you will receive an invoice from [facility]. Please contact [facility] at [phone] with questions or concerns regarding your invoice.   IF you received labwork today, you will receive an invoice from [place]. Please contact [facility] at [phone] with questions or concerns regarding your invoice.   Our billing staff will not be able to assist you with questions regarding bills from these companies.  You will be contacted with the lab results as soon as they are available. The fastest way to get your results is to activate your My Chart account. Instructions are located on the last page of this paperwork. If you have not heard from [place] regarding the results in 2 weeks, please contact this office.     Coping With Loss, Adult People experience loss in many different ways throughout their lives. Events such as moving, changing jobs, and losing friends can create a sense of loss. The loss may be as serious as a major health change, divorce, death of a pet, or death of a loved one. All of these types of loss are likely to create a physical and emotional reaction known as grief. Grief is the result of a major change or an absence of something or someone that you count on. Grief is a normal reaction to loss. How to recognize changes A variety of factors can affect your grieving experience, including:  The nature of your loss.  Your relationship to what or whom you lost.  Your understanding of grief and how to cope with it.  Your support system.  The way that you deal with your grief will affect your ability to function as you normally do. When you are grieving, you may experience:  Numbness, shock, sadness, anxiety, anger, denial, and guilt.  Thoughts about death.  Unexpected crying.  A physical sensation of emptiness in your gut.  Problems sleeping and eating.  Fatigue.  Loss of interest in normal activities.  Dreaming  about or imagining seeing the person who died.  A need to remember what or whom you lost.  Difficulty thinking about anything other than your loss for a period of time.  Relief. If you have been expecting the loss for a while, you may feel a sense of relief when it happens.  Where to find support To get support for coping with loss:  Ask your health care provider for help and recommendations, such as grief counseling or therapy.  Think about joining a support group for people who are coping with loss.  Follow these instructions at home:  Be patient with yourself and others. Allow the grieving process to happen, and remember that grieving takes time. ? It is likely that you may never feel completely done with some grief. You may find a way to move on while still cherishing memories and feelings about your loss. ? Accepting your loss is a process. It can take months or longer to adjust.  Express your feelings in healthy ways, such as: ? Talking with others about your loss. It may be helpful to find others who have had a similar loss, such as a support group. ? Writing down your feelings in a journal. ? Doing physical activities to release stress and emotional energy. ? Doing creative activities like painting, sculpting, or playing or listening to music. ? Practicing resilience. This is the ability to recover and adjust after facing challenges. Reading some resources that encourage resilience may help you to learn ways  to practice those behaviors.  Keep to your normal routine as much as possible. If you have trouble focusing or doing normal activities, it is acceptable to take some time away from your normal routine.  Spend time with friends and loved ones.  Eat a healthy diet, get plenty of sleep, and rest when you feel tired. Where to find more information: You can find more information about coping with loss from:  American Society of Clinical Oncology: www.cancer.net  American  Psychological Association: TVStereos.ch  Contact a health care provider if:  Your grief is extreme and keeps getting worse.  You have ongoing grief that does not improve.  Your body shows symptoms of grief, such as illness.  You feel depressed, anxious, or lonely. Get help right away if:  You have thoughts about hurting yourself or others. If you ever feel like you may hurt yourself or others, or have thoughts about taking your own life, get help right away. You can go to your nearest emergency department or call:  Your local emergency services (911 in the [place]  A suicide crisis helpline, such as the [facility] at 765-842-0428. This is open 24 hours a day.  Summary  Grief is a normal part of experiencing a loss. It is the result of a major change or an absence of something or someone that you count on.  The depth of grief and the period of recovery depend on the type of loss as well as your ability to adjust to the change and process your feelings.  Processing grief requires patience and a willingness to accept your feelings and talk about your loss with people who are supportive.  It is important to find resources that work for you and to realize that we are all different when it comes to grief. There is not one single grieving process that works for everyone in the same way.  Be aware that when grief becomes extreme, it can lead to more severe issues like isolation, depression, anxiety, or suicidal thoughts. Talk with your health care provider if you have any of these issues. This information is not intended to replace advice given to you by your health care provider. Make sure you discuss any questions you have with your health care provider. Document Released: 01/28/2017 Document Revised: 01/28/2017 Document Reviewed: 01/28/2017 Elsevier Interactive Patient Education  2018 [employer].

## 2017-10-04 NOTE — Progress Notes (Signed)
Chief Complaint  Patient presents with  . medication followup    HPI   She is on a high dose of venlafaxine at 225mg  She reports that she starts tasks but she does not finish them She has good days and bad days She states that her attention is all over the place She sleeps well if she falls asleep She takes gabapentin to RLS and sometimes she still does not sleep  Depression screen [facility] 2/9 10/04/2017 03/24/2017 02/16/2017 11/18/2016 10/26/2016  Decreased Interest 0 0 0 0 0  Down, Depressed, Hopeless 0 0 0 0 0  PHQ - 2 Score 0 0 0 0 0  Altered sleeping - - - - -  Tired, decreased energy - - - - -  Change in appetite - - - - -  Feeling bad or failure about yourself  - - - - -  Trouble concentrating - - - - -  Moving slowly or fidgety/restless - - - - -  Suicidal thoughts - - - - -  PHQ-9 Score - - - - -  Difficult doing work/chores - - - - -      Past Medical History:  Diagnosis Date  . Allergy   . Arthritis     Current Outpatient Medications  Medication Sig Dispense Refill  . gabapentin (NEURONTIN) 300 MG capsule Take 1 capsule (300 mg total) by mouth at bedtime. 90 capsule 3  . venlafaxine XR (EFFEXOR-XR) 75 MG 24 hr capsule Take 3 capsules (225 mg total) by mouth daily with breakfast. 270 capsule 3   Current Facility-Administered Medications  Medication Dose Route Frequency Provider Last Rate Last Dose  . lidocaine-EPINEPHrine (XYLOCAINE W/EPI) 1 %-1:100000 (with pres) injection 2 mL  2 mL Other Once Adhya Cocco A, MD      . triamcinolone acetonide (KENALOG-40) injection 40 mg  40 mg Intramuscular Once Forrest Moron, MD        Allergies:  Allergies  Allergen Reactions  . Codeine Nausea And Vomiting  . Meloxicam Hypertension    Past Surgical History:  Procedure Laterality Date  . ABDOMINAL HYSTERECTOMY    . CESAREAN SECTION      Social History   Socioeconomic History  . Marital status: Married    Spouse name: None  . Number of children: None  .  Years of education: None  . Highest education level: None  Social Needs  . Financial resource strain: None  . Food insecurity - worry: None  . Food insecurity - inability: None  . Transportation needs - medical: None  . Transportation needs - non-medical: None  Occupational History  . None  Tobacco Use  . Smoking status: Former Research scientist (life sciences)  . Smokeless tobacco: Never Used  Substance and Sexual Activity  . Alcohol use: No  . Drug use: No  . Sexual activity: Yes    Birth control/protection: None  Other Topics Concern  . None  Social History Narrative  . None    Family History  Problem Relation Age of Onset  . Diabetes Mother   . Cancer Father 58       Lung cancer  . Diabetes Sister   . Diabetes Brother   . Diabetes Sister      ROS Review of Systems See HPI Constitution: No fevers or chills No malaise No diaphoresis Skin: No rash or itching Eyes: no blurry vision, no double vision GU: no dysuria or hematuria Neuro: no dizziness or headaches all others reviewed and negative   Objective: Vitals:  10/04/17 1356  BP: 122/80  Pulse: 97  Resp: 16  Temp: 98.6 F (37 C)  TempSrc: Oral  SpO2: 98%  Weight: 167 lb (75.8 kg)  Height: 5\' 2"  (1.575 m)    Physical Exam  Constitutional: She is oriented to person, place, and time. She appears well-developed and well-nourished.  HENT:  Head: Normocephalic and atraumatic.  Eyes: Conjunctivae and EOM are normal.  Cardiovascular: Normal rate, regular rhythm and normal heart sounds.  No murmur heard. Pulmonary/Chest: Effort normal and breath sounds normal. No stridor. No respiratory distress. She has no wheezes.  Neurological: She is alert and oriented to person, place, and time.  Skin: Skin is warm. Capillary refill takes less than 2 seconds.  Psychiatric: She has a normal mood and affect. Her behavior is normal. Judgment and thought content normal.    Assessment and Plan Yocelin was seen today for medication  followup.  Diagnoses and all orders for this visit:  Anxiety and depression- high dose of venlafaxine Discussed grieving makes depression worse but is an expected time dependent experience Offered support -     venlafaxine XR (EFFEXOR-XR) 75 MG 24 hr capsule; Take 3 capsules (225 mg total) by mouth daily with breakfast.  Encounter for medication monitoring- refilled medication today  Grieving - discussed grief support -     venlafaxine XR (EFFEXOR-XR) 75 MG 24 hr capsule; Take 3 capsules (225 mg total) by mouth daily with breakfast.       Eirene Rather A Nolon Rod

## 2017-10-04 NOTE — Telephone Encounter (Signed)
Last OV 03/24/17.  Needs OV.

## 2017-10-30 ENCOUNTER — Ambulatory Visit: Payer: 59 | Admitting: Family Medicine

## 2017-10-30 ENCOUNTER — Encounter: Payer: Self-pay | Admitting: Family Medicine

## 2017-10-30 ENCOUNTER — Other Ambulatory Visit: Payer: Self-pay

## 2017-10-30 VITALS — BP 124/72 | HR 110 | Temp 97.6°F | Resp 16 | Ht 62.5 in | Wt 167.0 lb

## 2017-10-30 DIAGNOSIS — R52 Pain, unspecified: Secondary | ICD-10-CM

## 2017-10-30 DIAGNOSIS — R05 Cough: Secondary | ICD-10-CM

## 2017-10-30 DIAGNOSIS — R6889 Other general symptoms and signs: Secondary | ICD-10-CM

## 2017-10-30 DIAGNOSIS — R059 Cough, unspecified: Secondary | ICD-10-CM

## 2017-10-30 LAB — POCT INFLUENZA A/B
Influenza A, POC: NEGATIVE
Influenza B, POC: NEGATIVE

## 2017-10-30 MED ORDER — BENZONATATE 100 MG PO CAPS: 100.0000 mg | ORAL_CAPSULE | Freq: Three times a day (TID) | ORAL | 0 refills | Status: DC | PRN

## 2017-10-30 NOTE — Patient Instructions (Addendum)
Drink plenty of fluids and get enough rest.  Take Tylenol 2 pills every 6 hours as needed for fever and aching.  Can also take ibuprofen 200 mg 3 pills every 8 hours if needed for fever or aching.  Take the over-the-counter Robitussin-DM cough syrup when needed for cough.  Use benzonatate cough pills 1 or 2 pills 3 times daily as needed for daytime cough.  This will not cause drowsiness.      IF you received an x-ray today, you will receive an invoice from [facility]. Please contact [facility] at [phone] with questions or concerns regarding your invoice.   IF you received labwork today, you will receive an invoice from [place]. Please contact [facility] at [phone] with questions or concerns regarding your invoice.   Our billing staff will not be able to assist you with questions regarding bills from these companies.  You will be contacted with the lab results as soon as they are available. The fastest way to get your results is to activate your My Chart account. Instructions are located on the last page of this paperwork. If you have not heard from [place] regarding the results in 2 weeks, please contact this office.

## 2017-10-30 NOTE — Progress Notes (Signed)
Patient ID: Rachel Roth, female    DOB: Feb 10, 1966  Age: 52 y.o. MRN: [id number]  Chief Complaint  Patient presents with  . Cough    x2 days. Sore throat, cough, body aches. Productive cough, yellow and green sputum. Taking regular tylenol, took three this morning and two this afternoon.     Subjective:   52 year old lady who presents with a history of getting ill yesterday.  She developed a cough.  The cough was more nonstop yesterday than it is today.  She felt cold in the night but did not have any documented fevers or major chills.  She has had her flu shot.  She had a lot of body aches, and felt like she could hardly move this morning because shake so badly.  She has been taking Tylenol 3 pills when she got up and 2 more later today.  Has not had much head congestion.  A little sneezing.  Has a significant sore throat.  He is coughing up some mucus.  She felt warm to touch and I retook her temp at 98.7.  Current allergies, medications, problem list, past/family and social histories reviewed.  Objective:  BP 124/72   Pulse (!) 110   Temp 97.6 F (36.4 C)   Resp 16   Ht 5' 2.5" (1.588 m)   Wt 167 lb (75.8 kg)   SpO2 97%   BMI 30.06 kg/m   Looks ill.  TMs normal.  Nose not congested.  Throat not erythematous at all despite the discomfort.  Her neck is supple without nodes.  Chest is clear to auscultation, deep breaths do trigger some coughs.  Heart regular without murmurs.  Assessment & Plan:   Assessment: 1. Flu-like symptoms   2. Cough   3. Body aches       Plan: Suspect influenza in a patient who has had the shot and has suppressed fevers with her Tylenol.  Orders Placed This Encounter  Procedures  . POCT Influenza A/B    Meds ordered this encounter  Medications  . benzonatate (TESSALON) 100 MG capsule    Sig: Take 1-2 capsules (100-200 mg total) by mouth 3 (three) times daily as needed.    Dispense:  30 capsule    Refill:  0         Patient  Instructions   Drink plenty of fluids and get enough rest.  Take Tylenol 2 pills every 6 hours as needed for fever and aching.  Can also take ibuprofen 200 mg 3 pills every 8 hours if needed for fever or aching.  Take the over-the-counter Robitussin-DM cough syrup when needed for cough.  Use benzonatate cough pills 1 or 2 pills 3 times daily as needed for daytime cough.  This will not cause drowsiness.      IF you received an x-ray today, you will receive an invoice from [facility]. Please contact [facility] at [phone] with questions or concerns regarding your invoice.   IF you received labwork today, you will receive an invoice from [place]. Please contact [facility] at [phone] with questions or concerns regarding your invoice.   Our billing staff will not be able to assist you with questions regarding bills from these companies.  You will be contacted with the lab results as soon as they are available. The fastest way to get your results is to activate your My Chart account. Instructions are located on the last page of this paperwork. If you have not heard from [place] regarding the  results in 2 weeks, please contact this office.        No Follow-up on file.   Hever Castilleja, MD 10/30/2017

## 2017-11-02 DIAGNOSIS — J00 Acute nasopharyngitis [common cold]: Secondary | ICD-10-CM | POA: Diagnosis not present

## 2017-11-02 DIAGNOSIS — J029 Acute pharyngitis, unspecified: Secondary | ICD-10-CM | POA: Diagnosis not present

## 2017-11-08 ENCOUNTER — Ambulatory Visit: Payer: 59 | Admitting: Physician Assistant

## 2017-11-08 ENCOUNTER — Encounter: Payer: Self-pay | Admitting: Physician Assistant

## 2017-11-08 VITALS — BP 126/82 | HR 84 | Temp 97.7°F | Resp 16 | Ht 62.0 in | Wt 166.0 lb

## 2017-11-08 DIAGNOSIS — J22 Unspecified acute lower respiratory infection: Secondary | ICD-10-CM

## 2017-11-08 MED ORDER — FLUTICASONE PROPIONATE 50 MCG/ACT NA SUSP: 2.0000 | Freq: Every day | NASAL | 12 refills | Status: DC

## 2017-11-08 MED ORDER — GUAIFENESIN ER 1200 MG PO TB12: 1.0000 | ORAL_TABLET | Freq: Two times a day (BID) | ORAL | 1 refills | Status: DC | PRN

## 2017-11-08 MED ORDER — AZITHROMYCIN 250 MG PO TABS: ORAL_TABLET | ORAL | 0 refills | Status: DC

## 2017-11-08 NOTE — Patient Instructions (Addendum)
Please hydrate well with 64 oz of water if not more.   I would like you to take the medication as prescribed  Please return if you have any difficulty with your breathing, uncontrolled fever, dizziness, not tolerating the medication.

## 2017-11-08 NOTE — Progress Notes (Signed)
PRIMARY CARE AT [facility] [address], [place] [address] [phone]  Date:  11/08/2017   Name:  Rachel Roth   DOB:  1965-12-17   MRN:  [id number]  PCP:  Patient, No Pcp Per    History of Present Illness:  Rachel Roth is a 52 y.o. female patient who presents to PCP with  Chief Complaint  Patient presents with  . Sore Throat    pt was seen here 10/30/17 and [facility], not improving  . Sinusitis    chest congestion/ x 1 wk     Nasal congestion and body aches and facial pain have resolved.  She has some tightness at the center of her chest with the coughing.  Cough is productive throughout the Rachel of a greenish clumpy sputum.  No sob or dyspnea.  No windedness.  No fevers.  She has some ringing of her ears.   Non-smoker.    Patient Active Problem List   Diagnosis Date Noted  . Arthritis 10/25/2016  . RLS (restless legs syndrome) 03/19/2015  . Blood pressure elevated without history of HTN 11/01/2013  . Lower back pain 07/09/2012  . Hip pain 07/09/2012  . Cervical radiculopathy 05/29/2012  . Anxiety 05/29/2012  . Insomnia 05/29/2012  . BMI 31.0-31.9,adult 05/29/2012  . Depression 12/17/2011    Past Medical History:  Diagnosis Date  . Allergy   . Arthritis     Past Surgical History:  Procedure Laterality Date  . ABDOMINAL HYSTERECTOMY    . CESAREAN SECTION      Social History   Tobacco Use  . Smoking status: Former Research scientist (life sciences)  . Smokeless tobacco: Never Used  Substance Use Topics  . Alcohol use: No  . Drug use: No    Family History  Problem Relation Age of Onset  . Diabetes Mother   . Cancer Father 35       Lung cancer  . Diabetes Sister   . Diabetes Brother   . Diabetes Sister     Allergies  Allergen Reactions  . Codeine Nausea And Vomiting  . Meloxicam Hypertension    Medication list has been reviewed and updated.  Current Outpatient Medications on File Prior to Visit  Medication Sig Dispense Refill  . acetaminophen  (TYLENOL) 325 MG tablet Take 325 mg by mouth as needed (Taking 2-3 as needed).    . benzonatate (TESSALON) 100 MG capsule Take 1-2 capsules (100-200 mg total) by mouth 3 (three) times daily as needed. 30 capsule 0  . gabapentin (NEURONTIN) 300 MG capsule Take 1 capsule (300 mg total) by mouth at bedtime. 90 capsule 3  . venlafaxine XR (EFFEXOR-XR) 75 MG 24 hr capsule Take 3 capsules (225 mg total) by mouth daily with breakfast. 270 capsule 3   Current Facility-Administered Medications on File Prior to Visit  Medication Dose Route Frequency Provider Last Rate Last Dose  . lidocaine-EPINEPHrine (XYLOCAINE W/EPI) 1 %-1:100000 (with pres) injection 2 mL  2 mL Other Once Stallings, Zoe A, MD      . triamcinolone acetonide (KENALOG-40) injection 40 mg  40 mg Intramuscular Once Stallings, Zoe A, MD        ROS ROS otherwise unremarkable unless listed above.  Physical Examination: BP 126/82   Pulse 84   Temp 97.7 F (36.5 C) (Oral)   Resp 16   Ht 5\' 2"  (1.575 m)   Wt 166 lb (75.3 kg)   SpO2 97%   BMI 30.36 kg/m  Ideal Body Weight: Weight in (  lb) to have BMI = 25: 136.4  Physical Exam  Constitutional: She is oriented to person, place, and time. She appears well-developed and well-nourished. No distress.  HENT:  Head: Normocephalic and atraumatic.  Right Ear: Tympanic membrane, external ear and ear canal normal.  Left Ear: Tympanic membrane, external ear and ear canal normal.  Nose: Mucosal edema and rhinorrhea present. Right sinus exhibits no maxillary sinus tenderness and no frontal sinus tenderness. Left sinus exhibits no maxillary sinus tenderness and no frontal sinus tenderness.  Mouth/Throat: No uvula swelling. No oropharyngeal exudate, posterior oropharyngeal edema or posterior oropharyngeal erythema.  Eyes: Conjunctivae and EOM are normal. Pupils are equal, round, and reactive to light.  Cardiovascular: Normal rate and regular rhythm. Exam reveals no gallop, no distant heart sounds  and no friction rub.  No murmur heard. Pulmonary/Chest: Effort normal. No respiratory distress. She has no decreased breath sounds. She has no wheezes. She has no rhonchi.  Lymphadenopathy:       Head (right side): No submandibular, no tonsillar, no preauricular and no posterior auricular adenopathy present.       Head (left side): No submandibular, no tonsillar, no preauricular and no posterior auricular adenopathy present.  Neurological: She is alert and oriented to person, place, and time.  Skin: Skin is warm and dry. She is not diaphoretic.  Psychiatric: She has a normal mood and affect. Her behavior is normal.     Assessment and Plan: Rachel Roth is a 52 y.o. female who is here today for cc of  Chief Complaint  Patient presents with  . Sore Throat    pt was seen here [facility]/17 [facility], not improving  . Sinusitis    chest congestion/ x 1 wk   Lower respiratory infection (e.g., bronchitis, pneumonia, pneumonitis, pulmonitis) - Plan: Guaifenesin (MUCINEX MAXIMUM STRENGTH) 1200 MG TB12, azithromycin (ZITHROMAX) 250 MG tablet, fluticasone (FLONASE) 50 MCG/ACT nasal spray  Ivar Drape, PA-C [facility] 2/11/20192:26 PM

## 2017-11-28 ENCOUNTER — Other Ambulatory Visit: Payer: Self-pay | Admitting: Family Medicine

## 2017-11-29 NOTE — Telephone Encounter (Signed)
LOV with Dr. Brigitte Pulse on 10-26-16 in regards to this medication and RLS / Patient has had some acute visits, last acute visit was 11/08/17 with Ivar Drape / Refill request for Gabapentin

## 2017-12-28 ENCOUNTER — Encounter: Payer: Self-pay | Admitting: Physician Assistant

## 2017-12-29 DIAGNOSIS — Z1231 Encounter for screening mammogram for malignant neoplasm of breast: Secondary | ICD-10-CM | POA: Diagnosis not present

## 2018-01-03 ENCOUNTER — Ambulatory Visit: Payer: 59 | Admitting: Family Medicine

## 2018-02-22 ENCOUNTER — Other Ambulatory Visit: Payer: Self-pay | Admitting: Family Medicine

## 2018-02-25 ENCOUNTER — Ambulatory Visit: Payer: 59 | Admitting: Urgent Care

## 2018-02-25 ENCOUNTER — Encounter: Payer: Self-pay | Admitting: Urgent Care

## 2018-02-25 ENCOUNTER — Ambulatory Visit (INDEPENDENT_AMBULATORY_CARE_PROVIDER_SITE_OTHER): Payer: 59

## 2018-02-25 VITALS — BP 153/83 | HR 75 | Temp 98.6°F | Resp 17 | Ht 62.0 in | Wt 168.0 lb

## 2018-02-25 DIAGNOSIS — M542 Cervicalgia: Secondary | ICD-10-CM

## 2018-02-25 DIAGNOSIS — M436 Torticollis: Secondary | ICD-10-CM | POA: Diagnosis not present

## 2018-02-25 DIAGNOSIS — Z833 Family history of diabetes mellitus: Secondary | ICD-10-CM

## 2018-02-25 LAB — POCT GLYCOSYLATED HEMOGLOBIN (HGB A1C): Hemoglobin A1C: 5.8 % — AB (ref 4.0–5.6)

## 2018-02-25 MED ORDER — PREDNISONE 10 MG PO TABS: 40.0000 mg | ORAL_TABLET | Freq: Every day | ORAL | 0 refills | Status: DC

## 2018-02-25 MED ORDER — CYCLOBENZAPRINE HCL 5 MG PO TABS: 5.0000 mg | ORAL_TABLET | Freq: Three times a day (TID) | ORAL | 1 refills | Status: DC | PRN

## 2018-02-25 NOTE — Progress Notes (Signed)
   MRN: [id number] DOB: 09-22-1966  Subjective:   Rachel Roth is a 52 y.o. female presenting for 1 week history of persistent left sided aching neck pain with associated stiffness. Has left handed numbness intermittently and stiffness. Admits that this symptom has been more longstanding. Has used meloxicam without any relief.  Denies falls, trauma, weakness, radicular neck pain.  States that her neck pain is starting to give her headache.  Reports that she hydrates very well.  She stays very active.  She believes she did have an x-ray a while back that showed she had some mild arthritic changes but cannot recall what it was.  Reports family history of diabetes.  Rachel Roth has a current medication list which includes the following prescription(s): biotin, gabapentin, and venlafaxine xr. Also is allergic to codeine and meloxicam.  Rachel Roth  has a past medical history of Allergy and Arthritis. Also  has a past surgical history that includes Cesarean section and Abdominal hysterectomy.  Objective:   Vitals: BP (!) 153/83   Pulse 75   Temp 98.6 F (37 C) (Oral)   Resp 17   Ht 5\' 2"  (1.575 m)   Wt 168 lb (76.2 kg)   SpO2 98%   BMI 30.73 kg/m   Physical Exam  Constitutional: She is oriented to person, place, and time. She appears well-developed and well-nourished.  Cardiovascular: Normal rate.  Pulmonary/Chest: Effort normal.  Musculoskeletal:       Cervical back: She exhibits decreased range of motion (in all directions), tenderness (with ROM testing over areas depicted) and spasm (left trapezius). She exhibits no bony tenderness, no swelling, no edema, no deformity and no laceration.       Back:  Neurological: She is alert and oriented to person, place, and time. She displays normal reflexes.  Skin: Skin is warm and dry.  Psychiatric: She has a normal mood and affect.   Results for orders placed or performed in visit on 02/25/18 (from the past 24 hour(s))  POCT glycosylated  hemoglobin (Hb A1C)     Status: Abnormal   Collection Time: 02/25/18 11:00 AM  Result Value Ref Range   Hemoglobin A1C 5.8 (A) 4.0 - 5.6 %   HbA1c, POC (prediabetic range)  5.7 - 6.4 %   HbA1c, POC (controlled diabetic range)  0.0 - 7.0 %     Assessment and Plan :   Neck pain - Plan: DG Cervical Spine Complete  Neck stiffness - Plan: DG Cervical Spine Complete  Family history of diabetes mellitus - Plan: POCT glycosylated hemoglobin (Hb A1C)  We will treat patient with a short steroid course of prednisone given failed treatment with meloxicam.  Radiology report pending.  We will also check A1c level to see what her blood sugar is.  Jaynee Eagles, PA-C Primary Care at [facility] [phone] 02/25/2018  10:21 AM

## 2018-02-25 NOTE — Patient Instructions (Addendum)
Hydrate well with at least 2 liters (1 gallon) of water daily. Use Flexeril as a muscle relaxant up to 3 times daily or at bed time only if it causes sleepiness.    Musculoskeletal Pain Musculoskeletal pain is muscle and bone aches and pains. This pain can occur in any part of the body. Follow these instructions at home:  Only take medicines for pain, discomfort, or fever as told by your health care provider.  You may continue all activities unless the activities cause more pain. When the pain lessens, slowly resume normal activities. Gradually increase the intensity and duration of the activities or exercise.  During periods of severe pain, bed rest may be helpful. Lie or sit in any position that is comfortable, but get out of bed and walk around at least every several hours.  If directed, put ice on the injured area. ? Put ice in a plastic bag. ? Place a towel between your skin and the bag. ? Leave the ice on for 20 minutes, 2-3 times a day. Contact a health care provider if:  Your pain is getting worse.  Your pain is not relieved with medicines.  You lose function in the area of the pain if the pain is in your arms, legs, or neck. This information is not intended to replace advice given to you by your health care provider. Make sure you discuss any questions you have with your health care provider. Document Released: 09/14/2005 Document Revised: 02/25/2016 Document Reviewed: 05/19/2013 Elsevier Interactive Patient Education  2017 [employer].      IF you received an x-ray today, you will receive an invoice from [facility]. Please contact [facility] at [phone] with questions or concerns regarding your invoice.   IF you received labwork today, you will receive an invoice from [facility]. Please contact [facility] at [phone] with questions or concerns regarding your invoice.   Our billing staff will not be able to assist you with questions regarding  bills from these companies.  You will be contacted with the lab results as soon as they are available. The fastest way to get your results is to activate your My Chart account. Instructions are located on the last page of this paperwork. If you have not heard from [place] regarding the results in 2 weeks, please contact this office.

## 2018-02-28 ENCOUNTER — Telehealth: Payer: Self-pay | Admitting: Family Medicine

## 2018-02-28 NOTE — Telephone Encounter (Signed)
Returning call.

## 2018-02-28 NOTE — Telephone Encounter (Signed)
Copied from [place] [id number]. Topic: Quick Communication - Lab Results >> Feb 28, 2018 10:45 AM Suszanne Finch, LPN wrote: Hulen Skains patient to inform them of  lab results. When patient returns call, triage nurse may disclose results.

## 2018-03-01 NOTE — Telephone Encounter (Signed)
Phone call returned to pt. Informed of result notes by Jaynee Eagles on 02/25/18, re: C-Spine xray.  Also given result of Hgb A1C.  Pt. verb. understanding of recommendations to continue medication for neck pain and stiffness.  Stated the pain has improved but the stiffness remains the same.  Advised to continue medications as prescribed, and call back with continued or worsening symptoms.  Verb. Understanding. Agrees with plan.

## 2018-03-02 ENCOUNTER — Encounter: Payer: Self-pay | Admitting: *Deleted

## 2018-03-28 ENCOUNTER — Encounter: Payer: 59 | Admitting: Family Medicine

## 2018-04-04 ENCOUNTER — Encounter: Payer: 59 | Admitting: Family Medicine

## 2018-04-12 ENCOUNTER — Other Ambulatory Visit: Payer: Self-pay

## 2018-04-12 ENCOUNTER — Ambulatory Visit (INDEPENDENT_AMBULATORY_CARE_PROVIDER_SITE_OTHER): Payer: 59 | Admitting: Family Medicine

## 2018-04-12 ENCOUNTER — Encounter: Payer: Self-pay | Admitting: Family Medicine

## 2018-04-12 VITALS — BP 132/80 | HR 81 | Temp 98.4°F | Resp 18 | Ht 63.19 in | Wt 164.2 lb

## 2018-04-12 DIAGNOSIS — F3341 Major depressive disorder, recurrent, in partial remission: Secondary | ICD-10-CM

## 2018-04-12 DIAGNOSIS — E663 Overweight: Secondary | ICD-10-CM

## 2018-04-12 DIAGNOSIS — Z0000 Encounter for general adult medical examination without abnormal findings: Secondary | ICD-10-CM

## 2018-04-12 DIAGNOSIS — E781 Pure hyperglyceridemia: Secondary | ICD-10-CM

## 2018-04-12 LAB — COMPREHENSIVE METABOLIC PANEL
ALT: 11 IU/L (ref 0–32)
AST: 13 IU/L (ref 0–40)
Albumin/Globulin Ratio: 1.9 (ref 1.2–2.2)
Albumin: 4.2 g/dL (ref 3.5–5.5)
Alkaline Phosphatase: 126 IU/L — ABNORMAL HIGH (ref 39–117)
BUN/Creatinine Ratio: 16 (ref 9–23)
BUN: 11 mg/dL (ref 6–24)
Bilirubin Total: 0.3 mg/dL (ref 0.0–1.2)
CO2: 22 mmol/L (ref 20–29)
Calcium: 9.4 mg/dL (ref 8.7–10.2)
Chloride: 103 mmol/L (ref 96–106)
Creatinine, Ser: 0.69 mg/dL (ref 0.57–1.00)
GFR calc Af Amer: 117 mL/min/{1.73_m2} (ref >59)
GFR calc non Af Amer: 101 mL/min/{1.73_m2} (ref >59)
Globulin, Total: 2.2 g/dL (ref 1.5–4.5)
Glucose: 127 mg/dL — ABNORMAL HIGH (ref 65–99)
Potassium: 3.7 mmol/L (ref 3.5–5.2)
Sodium: 144 mmol/L (ref 134–144)
Total Protein: 6.4 g/dL (ref 6.0–8.5)

## 2018-04-12 LAB — LIPID PANEL
Chol/HDL Ratio: 4.3 ratio (ref 0.0–4.4)
Cholesterol, Total: 213 mg/dL — ABNORMAL HIGH (ref 100–199)
HDL: 49 mg/dL (ref >39)
LDL Calculated: 118 mg/dL — ABNORMAL HIGH (ref 0–99)
Triglycerides: 228 mg/dL — ABNORMAL HIGH (ref 0–149)
VLDL Cholesterol Cal: 46 mg/dL — ABNORMAL HIGH (ref 5–40)

## 2018-04-12 MED ORDER — DICLOFENAC SODIUM 1 % TD GEL: 2.0000 g | Freq: Four times a day (QID) | TRANSDERMAL | 6 refills | Status: DC

## 2018-04-12 NOTE — Progress Notes (Signed)
Chief Complaint  Patient presents with  . Annual Exam    Subjective:  Rachel Roth is a 52 y.o. female here for a health maintenance visit.  Patient is established pt  Patient Active Problem List   Diagnosis Date Noted  . Recurrent major depressive disorder, in partial remission ([place]) 04/12/2018  . Arthritis 10/25/2016  . RLS (restless legs syndrome) 03/19/2015  . Lower back pain 07/09/2012  . Hip pain 07/09/2012  . Cervical radiculopathy 05/29/2012  . Anxiety 05/29/2012  . Insomnia 05/29/2012  . BMI 31.0-31.9,adult 05/29/2012  . Depression 12/17/2011    Past Medical History:  Diagnosis Date  . Allergy   . Arthritis     Past Surgical History:  Procedure Laterality Date  . ABDOMINAL HYSTERECTOMY    . CESAREAN SECTION       Outpatient Medications Prior to Visit  Medication Sig Dispense Refill  . venlafaxine XR (EFFEXOR-XR) 75 MG 24 hr capsule Take 3 capsules (225 mg total) by mouth daily with breakfast. 270 capsule 3  . BIOTIN 5000 PO Take by mouth.    . cyclobenzaprine (FLEXERIL) 5 MG tablet Take 1 tablet (5 mg total) by mouth 3 (three) times daily as needed. 90 tablet 1  . gabapentin (NEURONTIN) 300 MG capsule Take 1 capsule (300 mg total) by mouth once for 1 dose. Office visit needed 90 capsule 0  . predniSONE (DELTASONE) 10 MG tablet Take 4 tablets (40 mg total) by mouth daily with breakfast. 20 tablet 0   No facility-administered medications prior to visit.     Allergies  Allergen Reactions  . Codeine Nausea And Vomiting  . Meloxicam Hypertension     Family History  Problem Relation Age of Onset  . Diabetes Mother   . Cancer Father 60       Lung cancer  . Diabetes Sister   . Diabetes Brother   . Diabetes Sister   . Cancer Sister 88       breast cancer  . Cancer Maternal Aunt 50       breast cancer  . Cancer Maternal Grandmother        breast cancer     Health Habits: Dental Exam: up to date Eye Exam: up to date Exercise: 4 times/week  on average Current exercise activities: walking/running Diet: balanced   Social History   Socioeconomic History  . Marital status: Married    Spouse name: Not on file  . Number of children: Not on file  . Years of education: Not on file  . Highest education level: Not on file  Occupational History  . Not on file  Social Needs  . Financial resource strain: Not on file  . Food insecurity:    Worry: Not on file    Inability: Not on file  . Transportation needs:    Medical: Not on file    Non-medical: Not on file  Tobacco Use  . Smoking status: Former Research scientist (life sciences)  . Smokeless tobacco: Never Used  Substance and Sexual Activity  . Alcohol use: No  . Drug use: No  . Sexual activity: Yes    Birth control/protection: None  Lifestyle  . Physical activity:    Days per week: Not on file    Minutes per session: Not on file  . Stress: Not on file  Relationships  . Social connections:    Talks on phone: Not on file    Gets together: Not on file    Attends religious service: Not on file  Active member of club or organization: Not on file    Attends meetings of clubs or organizations: Not on file    Relationship status: Not on file  . Intimate partner violence:    Fear of current or ex partner: Not on file    Emotionally abused: Not on file    Physically abused: Not on file    Forced sexual activity: Not on file  Other Topics Concern  . Not on file  Social History Narrative  . Not on file   Social History   Substance and Sexual Activity  Alcohol Use No   Social History   Tobacco Use  Smoking Status Former Smoker  Smokeless Tobacco Never Used   Social History   Substance and Sexual Activity  Drug Use No    GYN: Sexual Health Menstrual status: regular menses LMP: No LMP recorded. Patient has had a hysterectomy. Last pap smear: see HM section   Health Maintenance: See under health Maintenance activity for review of completion dates as well. Immunization History   Administered Date(s) Administered  . Influenza,inj,Quad PF,6+ Mos 06/28/2013, 07/25/2014, 06/05/2016      Depression Screen-PHQ2/9 Depression screen [facility] 2/9 05/31/2018 05/26/2018 04/12/2018 02/25/2018 10/30/2017  Decreased Interest 0 0 0 0 0  Down, Depressed, Hopeless 0 0 0 0 1  PHQ - 2 Score 0 0 0 0 1  Altered sleeping - - - - 3  Tired, decreased energy - - - - 0  Change in appetite - - - - 3  Feeling bad or failure about yourself  - - - - 0  Trouble concentrating - - - - 0  Moving slowly or fidgety/restless - - - - 0  Suicidal thoughts - - - - 0  PHQ-9 Score - - - - 7  Difficult doing work/chores - - - - -     Depression Severity and Treatment Recommendations:  0-4= None  5-9= Mild / Treatment: Support, educate to call if worse; return in one month  10-14= Moderate / Treatment: Support, watchful waiting; Antidepressant or Psycotherapy  15-19= Moderately severe / Treatment: Antidepressant OR Psychotherapy  >= 20 = Major depression, severe / Antidepressant AND Psychotherapy    Review of Systems   Review of Systems  Constitutional: Positive for malaise/fatigue. Negative for chills, fever and weight loss.  HENT: Negative for congestion, ear discharge and ear pain.   Respiratory: Negative for cough, sputum production and shortness of breath.   Cardiovascular: Negative for chest pain, palpitations and orthopnea.  Gastrointestinal: Negative for abdominal pain, constipation, diarrhea, nausea and vomiting.  Musculoskeletal: Positive for joint pain and neck pain.  Skin: Negative for itching and rash.  Neurological: Negative for dizziness, tingling, tremors and headaches.  Endo/Heme/Allergies: Positive for polydipsia.  Psychiatric/Behavioral: Negative for depression. The patient is not nervous/anxious and does not have insomnia.     See HPI for ROS as well.    Objective:   Vitals:   04/12/18 0949  BP: 132/80  Pulse: 81  Resp: 18  Temp: 98.4 F (36.9 C)  TempSrc: Oral   SpO2: 98%  Weight: 164 lb 3.2 oz (74.5 kg)  Height: 5' 3.19" (1.605 m)  Body mass index is 28.91 kg/m.  Wt Readings from Last 3 Encounters:  05/31/18 162 lb 3.2 oz (73.6 kg)  05/26/18 164 lb 9.6 oz (74.7 kg)  04/12/18 164 lb 3.2 oz (74.5 kg)    BP Readings from Last 3 Encounters:  05/31/18 122/74  05/26/18 (!) 138/92  04/12/18 132/80  Body mass index is 28.91 kg/m.   Chaperone present   Physical Exam  Constitutional: She is oriented to person, place, and time. She appears well-developed and well-nourished.  HENT:  Head: Normocephalic and atraumatic.  Right Ear: External ear normal.  Left Ear: External ear normal.  Mouth/Throat: Oropharynx is clear and moist.  Eyes: Conjunctivae and EOM are normal.  Cardiovascular: Normal rate, regular rhythm and normal heart sounds.  No murmur heard. Pulmonary/Chest: Effort normal and breath sounds normal. No stridor. No respiratory distress. She has no wheezes. Right breast exhibits no inverted nipple, no mass, no nipple discharge, no skin change and no tenderness. Left breast exhibits no inverted nipple, no mass, no nipple discharge, no skin change and no tenderness. No breast swelling, tenderness, discharge or bleeding. Breasts are symmetrical.  Abdominal: Soft. Bowel sounds are normal. She exhibits no distension. There is no tenderness. There is no guarding.  Neurological: She is alert and oriented to person, place, and time.  Skin: Skin is warm. Capillary refill takes less than 2 seconds.  Numerous moles, keratosis and skin tags  Psychiatric: She has a normal mood and affect. Her behavior is normal. Judgment and thought content normal.        Assessment/Plan:   Patient was seen for a health maintenance exam.  Counseled the patient on health maintenance issues. Reviewed her health mainteance schedule and ordered appropriate tests (see orders.) Counseled on regular exercise and weight management. Recommend regular eye exams and  dental cleaning.   The following issues were addressed today for health maintenance:   Rachel Roth was seen today for annual exam.  Diagnoses and all orders for this visit:  Health maintenance examination  Hypertriglyceridemia -     Lipid panel -     Comprehensive metabolic panel  Recurrent major depressive disorder, in partial remission (H[facility])  Overweight  Other orders -     diclofenac sodium (VOLTAREN) 1 % GEL; Apply 2 g topically 4 (four) times daily.    Return in 6 months (on 10/13/2018) for prediabetes, blood pressure.    Body mass index is 28.91 kg/m.:  Discussed the patient's BMI with patient. The BMI body mass index is 28.91 kg/m.     Future Appointments  Date Time Provider [place]  06/29/2018 11:00 AM Forrest Moron, MD PCP-PCP [facility]    Patient Instructions   If insurance does not cover Volatren gel then try Aspercreme with Lidocaine over the counter.       IF you received an x-ray today, you will receive an invoice from [facility]. Please contact [facility] at [phone] with questions or concerns regarding your invoice.   IF you received labwork today, you will receive an invoice from [facility]. Please contact [facility] at [phone] with questions or concerns regarding your invoice.   Our billing staff will not be able to assist you with questions regarding bills from these companies.  You will be contacted with the lab results as soon as they are available. The fastest way to get your results is to activate your My Chart account. Instructions are located on the last page of this paperwork. If you have not heard from [place] regarding the results in 2 weeks, please contact this office.    Food Choices to Lower Your Triglycerides Triglycerides are a type of fat in your blood. High levels of triglycerides can increase the risk of heart disease and stroke. If your triglyceride levels are high, the foods you eat and your eating  habits are  very important. Choosing the right foods can help lower your triglycerides. What general guidelines do I need to follow?  Lose weight if you are overweight.  Limit or avoid alcohol.  Fill one half of your plate with vegetables and green salads.  Limit fruit to two servings a day. Choose fruit instead of juice.  Make one fourth of your plate whole grains. Look for the word "whole" as the first word in the ingredient list.  Fill one fourth of your plate with lean protein foods.  Enjoy fatty fish (such as salmon, mackerel, sardines, and tuna) three times a week.  Choose healthy fats.  Limit foods high in starch and sugar.  Eat more home-cooked food and less restaurant, buffet, and fast food.  Limit fried foods.  Cook foods using methods other than frying.  Limit saturated fats.  Check ingredient lists to avoid foods with partially hydrogenated oils (trans fats) in them. What foods can I eat? Grains Whole grains, such as whole wheat or whole grain breads, crackers, cereals, and pasta. Unsweetened oatmeal, bulgur, barley, quinoa, or brown rice. Corn or whole wheat flour tortillas. Vegetables Fresh or frozen vegetables (raw, steamed, roasted, or grilled). Green salads. Fruits All fresh, canned (in natural juice), or frozen fruits. Meat and Other Protein Products Ground beef (85% or leaner), grass-fed beef, or beef trimmed of fat. Skinless chicken or [place]. Ground chicken or [place]. Pork trimmed of fat. All fish and seafood. Eggs. Dried beans, peas, or lentils. Unsalted nuts or seeds. Unsalted canned or dry beans. Dairy Low-fat dairy products, such as skim or 1% milk, 2% or reduced-fat cheeses, low-fat ricotta or cottage cheese, or plain low-fat yogurt. Fats and Oils Tub margarines without trans fats. Light or reduced-fat mayonnaise and salad dressings. Avocado. Safflower, olive, or canola oils. Natural peanut or almond butter. The items listed above may not be a  complete list of recommended foods or beverages. Contact your dietitian for more options. What foods are not recommended? Grains White bread. White pasta. White rice. Cornbread. Bagels, pastries, and croissants. Crackers that contain trans fat. Vegetables White potatoes. Corn. Creamed or fried vegetables. Vegetables in a cheese sauce. Fruits Dried fruits. Canned fruit in light or heavy syrup. Fruit juice. Meat and Other Protein Products Fatty cuts of meat. Ribs, chicken wings, bacon, sausage, bologna, salami, chitterlings, fatback, hot dogs, bratwurst, and packaged luncheon meats. Dairy Whole or 2% milk, cream, half-and-half, and cream cheese. Whole-fat or sweetened yogurt. Full-fat cheeses. Nondairy creamers and whipped toppings. Processed cheese, cheese spreads, or cheese curds. Sweets and Desserts Corn syrup, sugars, honey, and molasses. Candy. Jam and jelly. Syrup. Sweetened cereals. Cookies, pies, cakes, donuts, muffins, and ice cream. Fats and Oils Butter, stick margarine, lard, shortening, ghee, or bacon fat. Coconut, palm kernel, or palm oils. Beverages Alcohol. Sweetened drinks (such as sodas, lemonade, and fruit drinks or punches). The items listed above may not be a complete list of foods and beverages to avoid. Contact your dietitian for more information. This information is not intended to replace advice given to you by your health care provider. Make sure you discuss any questions you have with your health care provider. Document Released: 07/02/2004 Document Revised: 02/20/2016 Document Reviewed: 07/19/2013 Elsevier Interactive Patient Education  2017 [employer].

## 2018-04-12 NOTE — Patient Instructions (Addendum)
If insurance does not cover Volatren gel then try Aspercreme with Lidocaine over the counter.       IF you received an x-ray today, you will receive an invoice from [facility]. Please contact [facility] at [phone] with questions or concerns regarding your invoice.   IF you received labwork today, you will receive an invoice from [facility]. Please contact [facility] at [phone] with questions or concerns regarding your invoice.   Our billing staff will not be able to assist you with questions regarding bills from these companies.  You will be contacted with the lab results as soon as they are available. The fastest way to get your results is to activate your My Chart account. Instructions are located on the last page of this paperwork. If you have not heard from [place] regarding the results in 2 weeks, please contact this office.    Food Choices to Lower Your Triglycerides Triglycerides are a type of fat in your blood. High levels of triglycerides can increase the risk of heart disease and stroke. If your triglyceride levels are high, the foods you eat and your eating habits are very important. Choosing the right foods can help lower your triglycerides. What general guidelines do I need to follow?  Lose weight if you are overweight.  Limit or avoid alcohol.  Fill one half of your plate with vegetables and green salads.  Limit fruit to two servings a day. Choose fruit instead of juice.  Make one fourth of your plate whole grains. Look for the word "whole" as the first word in the ingredient list.  Fill one fourth of your plate with lean protein foods.  Enjoy fatty fish (such as salmon, mackerel, sardines, and tuna) three times a week.  Choose healthy fats.  Limit foods high in starch and sugar.  Eat more home-cooked food and less restaurant, buffet, and fast food.  Limit fried foods.  Cook foods using methods other than frying.  Limit saturated  fats.  Check ingredient lists to avoid foods with partially hydrogenated oils (trans fats) in them. What foods can I eat? Grains Whole grains, such as whole wheat or whole grain breads, crackers, cereals, and pasta. Unsweetened oatmeal, bulgur, barley, quinoa, or brown rice. Corn or whole wheat flour tortillas. Vegetables Fresh or frozen vegetables (raw, steamed, roasted, or grilled). Green salads. Fruits All fresh, canned (in natural juice), or frozen fruits. Meat and Other Protein Products Ground beef (85% or leaner), grass-fed beef, or beef trimmed of fat. Skinless chicken or [place]. Ground chicken or [place]. Pork trimmed of fat. All fish and seafood. Eggs. Dried beans, peas, or lentils. Unsalted nuts or seeds. Unsalted canned or dry beans. Dairy Low-fat dairy products, such as skim or 1% milk, 2% or reduced-fat cheeses, low-fat ricotta or cottage cheese, or plain low-fat yogurt. Fats and Oils Tub margarines without trans fats. Light or reduced-fat mayonnaise and salad dressings. Avocado. Safflower, olive, or canola oils. Natural peanut or almond butter. The items listed above may not be a complete list of recommended foods or beverages. Contact your dietitian for more options. What foods are not recommended? Grains White bread. White pasta. White rice. Cornbread. Bagels, pastries, and croissants. Crackers that contain trans fat. Vegetables White potatoes. Corn. Creamed or fried vegetables. Vegetables in a cheese sauce. Fruits Dried fruits. Canned fruit in light or heavy syrup. Fruit juice. Meat and Other Protein Products Fatty cuts of meat. Ribs, chicken wings, bacon, sausage, bologna, salami, chitterlings, fatback, hot dogs, bratwurst, and packaged luncheon  meats. Dairy Whole or 2% milk, cream, half-and-half, and cream cheese. Whole-fat or sweetened yogurt. Full-fat cheeses. Nondairy creamers and whipped toppings. Processed cheese, cheese spreads, or cheese curds. Sweets and  Desserts Corn syrup, sugars, honey, and molasses. Candy. Jam and jelly. Syrup. Sweetened cereals. Cookies, pies, cakes, donuts, muffins, and ice cream. Fats and Oils Butter, stick margarine, lard, shortening, ghee, or bacon fat. Coconut, palm kernel, or palm oils. Beverages Alcohol. Sweetened drinks (such as sodas, lemonade, and fruit drinks or punches). The items listed above may not be a complete list of foods and beverages to avoid. Contact your dietitian for more information. This information is not intended to replace advice given to you by your health care provider. Make sure you discuss any questions you have with your health care provider. Document Released: 07/02/2004 Document Revised: 02/20/2016 Document Reviewed: 07/19/2013 Elsevier Interactive Patient Education  2017 [employer].

## 2018-04-21 ENCOUNTER — Telehealth: Payer: Self-pay | Admitting: Family Medicine

## 2018-04-21 NOTE — Telephone Encounter (Signed)
Copied from [place] [id number]. Topic: Quick Communication - See Telephone Encounter >> Apr 21, 2018 10:40 AM Rutherford Nail, NT wrote: CRM for notification. See Telephone encounter for: 04/21/18. Patient calling to obtain lab results. No CRM stating [facility] can give results. Please advise [phone]

## 2018-04-27 NOTE — Telephone Encounter (Signed)
Spoke with pt via phone an lab results given and pt verbalized understanding.  Will call us back with dermatologist name and number as she will need a referral.  Advised to call us with the info when she has it and will get info to dr Nolon Rod to put in referral.

## 2018-05-11 ENCOUNTER — Other Ambulatory Visit: Payer: Self-pay | Admitting: Urgent Care

## 2018-05-11 NOTE — Telephone Encounter (Signed)
Flexeril 5 mg  refill Last Refill:02/25/18 # 90 Last OV: 04/12/18 PCP: Nolon Rod Pharmacy:[facility], [place]

## 2018-05-14 ENCOUNTER — Other Ambulatory Visit: Payer: Self-pay | Admitting: Family Medicine

## 2018-05-26 ENCOUNTER — Encounter: Payer: Self-pay | Admitting: Family Medicine

## 2018-05-26 ENCOUNTER — Other Ambulatory Visit: Payer: Self-pay

## 2018-05-26 ENCOUNTER — Ambulatory Visit: Payer: 59 | Admitting: Family Medicine

## 2018-05-26 VITALS — BP 138/92 | HR 100 | Temp 98.7°F | Resp 17 | Ht 63.19 in | Wt 164.6 lb

## 2018-05-26 DIAGNOSIS — I10 Essential (primary) hypertension: Secondary | ICD-10-CM | POA: Diagnosis not present

## 2018-05-26 DIAGNOSIS — J020 Streptococcal pharyngitis: Secondary | ICD-10-CM | POA: Diagnosis not present

## 2018-05-26 LAB — POCT RAPID STREP A (OFFICE): Rapid Strep A Screen: POSITIVE — AB

## 2018-05-26 MED ORDER — FLUCONAZOLE 150 MG PO TABS: 150.0000 mg | ORAL_TABLET | Freq: Once | ORAL | 0 refills | Status: AC

## 2018-05-26 MED ORDER — AMOXICILLIN 500 MG PO CAPS: 500.0000 mg | ORAL_CAPSULE | Freq: Two times a day (BID) | ORAL | 0 refills | Status: AC

## 2018-05-26 MED ORDER — GABAPENTIN 300 MG PO CAPS: 300.0000 mg | ORAL_CAPSULE | Freq: Once | ORAL | 0 refills | Status: DC

## 2018-05-26 MED ORDER — AMLODIPINE BESYLATE 5 MG PO TABS: 5.0000 mg | ORAL_TABLET | Freq: Every day | ORAL | 0 refills | Status: DC

## 2018-05-26 NOTE — Patient Instructions (Addendum)
   If you have lab work done today you will be contacted with your lab results within the next 2 weeks.  If you have not heard from us then please contact us. The fastest way to get your results is to register for My Chart.   IF you received an x-ray today, you will receive an invoice from [facility]. Please contact [facility] at [phone] with questions or concerns regarding your invoice.   IF you received labwork today, you will receive an invoice from [facility]. Please contact [facility] at [phone] with questions or concerns regarding your invoice.   Our billing staff will not be able to assist you with questions regarding bills from these companies.  You will be contacted with the lab results as soon as they are available. The fastest way to get your results is to activate your My Chart account. Instructions are located on the last page of this paperwork. If you have not heard from us regarding the results in 2 weeks, please contact this office.     Hypertension Hypertension, commonly called high blood pressure, is when the force of blood pumping through the arteries is too strong. The arteries are the blood vessels that carry blood from the heart throughout the body. Hypertension forces the heart to work harder to pump blood and may cause arteries to become narrow or stiff. Having untreated or uncontrolled hypertension can cause heart attacks, strokes, kidney disease, and other problems. A blood pressure reading consists of a higher number over a lower number. Ideally, your blood pressure should be below 120/80. The first ("top") number is called the systolic pressure. It is a measure of the pressure in your arteries as your heart beats. The second ("bottom") number is called the diastolic pressure. It is a measure of the pressure in your arteries as the heart relaxes. What are the causes? The cause of this condition is not known. What increases the risk? Some  risk factors for high blood pressure are under your control. Others are not. Factors you can change  Smoking.  Having type 2 diabetes mellitus, high cholesterol, or both.  Not getting enough exercise or physical activity.  Being overweight.  Having too much fat, sugar, calories, or salt (sodium) in your diet.  Drinking too much alcohol. Factors that are difficult or impossible to change  Having chronic kidney disease.  Having a family history of high blood pressure.  Age. Risk increases with age.  Race. You may be at higher risk if you are African-American.  Gender. Men are at higher risk than women before age 45. After age 65, women are at higher risk than men.  Having obstructive sleep apnea.  Stress. What are the signs or symptoms? Extremely high blood pressure (hypertensive crisis) may cause:  Headache.  Anxiety.  Shortness of breath.  Nosebleed.  Nausea and vomiting.  Severe chest pain.  Jerky movements you cannot control (seizures).  How is this diagnosed? This condition is diagnosed by measuring your blood pressure while you are seated, with your arm resting on a surface. The cuff of the blood pressure monitor will be placed directly against the skin of your upper arm at the level of your heart. It should be measured at least twice using the same arm. Certain conditions can cause a difference in blood pressure between your right and left arms. Certain factors can cause blood pressure readings to be lower or higher than normal (elevated) for a short period of time:  When   your blood pressure is higher when you are in a health care provider's office than when you are at home, this is called white coat hypertension. Most people with this condition do not need medicines.  When your blood pressure is higher at home than when you are in a health care provider's office, this is called masked hypertension. Most people with this condition may need medicines to control  blood pressure.  If you have a high blood pressure reading during one visit or you have normal blood pressure with other risk factors:  You may be asked to return on a different day to have your blood pressure checked again.  You may be asked to monitor your blood pressure at home for 1 week or longer.  If you are diagnosed with hypertension, you may have other blood or imaging tests to help your health care provider understand your overall risk for other conditions. How is this treated? This condition is treated by making healthy lifestyle changes, such as eating healthy foods, exercising more, and reducing your alcohol intake. Your health care provider may prescribe medicine if lifestyle changes are not enough to get your blood pressure under control, and if:  Your systolic blood pressure is above 130.  Your diastolic blood pressure is above 80.  Your personal target blood pressure may vary depending on your medical conditions, your age, and other factors. Follow these instructions at home: Eating and drinking  Eat a diet that is high in fiber and potassium, and low in sodium, added sugar, and fat. An example eating plan is called the DASH (Dietary Approaches to Stop Hypertension) diet. To eat this way: ? Eat plenty of fresh fruits and vegetables. Try to fill half of your plate at each meal with fruits and vegetables. ? Eat whole grains, such as whole wheat pasta, brown rice, or whole grain bread. Fill about one quarter of your plate with whole grains. ? Eat or drink low-fat dairy products, such as skim milk or low-fat yogurt. ? Avoid fatty cuts of meat, processed or cured meats, and poultry with skin. Fill about one quarter of your plate with lean proteins, such as fish, chicken without skin, beans, eggs, and tofu. ? Avoid premade and processed foods. These tend to be higher in sodium, added sugar, and fat.  Reduce your daily sodium intake. Most people with hypertension should eat less  than 1,500 mg of sodium a day.  Limit alcohol intake to no more than 1 drink a day for nonpregnant women and 2 drinks a day for men. One drink equals 12 oz of beer, 5 oz of wine, or 1 oz of hard liquor. Lifestyle  Work with your health care provider to maintain a healthy body weight or to lose weight. Ask what an ideal weight is for you.  Get at least 30 minutes of exercise that causes your heart to beat faster (aerobic exercise) most days of the week. Activities may include walking, swimming, or biking.  Include exercise to strengthen your muscles (resistance exercise), such as pilates or lifting weights, as part of your weekly exercise routine. Try to do these types of exercises for 30 minutes at least 3 days a week.  Do not use any products that contain nicotine or tobacco, such as cigarettes and e-cigarettes. If you need help quitting, ask your health care provider.  Monitor your blood pressure at home as told by your health care provider.  Keep all follow-up visits as told by your health care provider.   This is important. Medicines  Take over-the-counter and prescription medicines only as told by your health care provider. Follow directions carefully. Blood pressure medicines must be taken as prescribed.  Do not skip doses of blood pressure medicine. Doing this puts you at risk for problems and can make the medicine less effective.  Ask your health care provider about side effects or reactions to medicines that you should watch for. Contact a health care provider if:  You think you are having a reaction to a medicine you are taking.  You have headaches that keep coming back (recurring).  You feel dizzy.  You have swelling in your ankles.  You have trouble with your vision. Get help right away if:  You develop a severe headache or confusion.  You have unusual weakness or numbness.  You feel faint.  You have severe pain in your chest or abdomen.  You vomit  repeatedly.  You have trouble breathing. Summary  Hypertension is when the force of blood pumping through your arteries is too strong. If this condition is not controlled, it may put you at risk for serious complications.  Your personal target blood pressure may vary depending on your medical conditions, your age, and other factors. For most people, a normal blood pressure is less than 120/80.  Hypertension is treated with lifestyle changes, medicines, or a combination of both. Lifestyle changes include weight loss, eating a healthy, low-sodium diet, exercising more, and limiting alcohol. This information is not intended to replace advice given to you by your health care provider. Make sure you discuss any questions you have with your health care provider. Document Released: 09/14/2005 Document Revised: 08/12/2016 Document Reviewed: 08/12/2016 Elsevier Interactive Patient Education  2018 [employer]  

## 2018-05-26 NOTE — Progress Notes (Signed)
Chief Complaint  Patient presents with  . 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 She statse that yesterday she felt like she developed achiness and headaches She felt like the energy had been drained out of her She states that she has some throat pain She states that has not had a fever   Hypertension - new diagnosis She reports that she had dizziness at home and checked her bp and it was 160s/90s She states that she tried to get comfort but nothing helped the dizziness She denies chest pain, shortness of breath or lower extremity edema BP Readings from Last 3 Encounters:  05/26/18 (!) 138/92  04/12/18 132/80  02/25/18 (!) 153/83  10/26/2016       144/81    Past Medical History:  Diagnosis Date  . Allergy   . Arthritis     Current Outpatient Medications  Medication Sig Dispense Refill  . BIOTIN 5000 PO Take by mouth.    . diclofenac sodium (VOLTAREN) 1 % GEL Apply 2 g topically 4 (four) times daily. 100 g 6  . venlafaxine XR (EFFEXOR-XR) 75 MG 24 hr capsule Take 3 capsules (225 mg total) by mouth daily with breakfast. 270 capsule 3  . amLODipine (NORVASC) 5 MG tablet Take 1 tablet (5 mg total) by mouth daily. 90 tablet 0  . amoxicillin (AMOXIL) 500 MG capsule Take 1 capsule (500 mg total) by mouth 2 (two) times daily for 10 days. 20 capsule 0  . cyclobenzaprine (FLEXERIL) 5 MG tablet Take 1 tablet (5 mg total) by mouth 3 (three) times daily as needed. (Patient not taking: Reported on 04/12/2018) 90 tablet 1  . fluconazole (DIFLUCAN) 150 MG tablet Take 1 tablet (150 mg total) by mouth once for 1 dose. Repeat in 3 days for yeast infection 2 tablet 0  . gabapentin (NEURONTIN) 300 MG capsule Take 1 capsule (300 mg total) by mouth once for 1 dose. Office visit needed 90 capsule 0   No current facility-administered medications for this visit.      Allergies:  Allergies  Allergen Reactions  . Codeine Nausea And Vomiting  . Meloxicam Hypertension    Past Surgical History:  Procedure Laterality Date  . ABDOMINAL HYSTERECTOMY    . CESAREAN SECTION      Social History   Socioeconomic History  . Marital status: Married    Spouse name: Not on file  . Number of children: Not on file  . Years of education: Not on file  . Highest education level: Not on file  Occupational History  . Not on file  Social Needs  . Financial resource strain: Not on file  . Food insecurity:    Worry: Not on file    Inability: Not on file  . Transportation needs:    Medical: Not on file    Non-medical: Not on file  Tobacco Use  . Smoking status: Former Research scientist (life sciences)  . Smokeless tobacco: Never Used  Substance and Sexual Activity  . Alcohol use: No  . Drug use: No  . Sexual activity: Yes    Birth control/protection: None  Lifestyle  . Physical activity:    Days per week: Not on file    Minutes per session: Not on file  . Stress: Not on file  Relationships  . Social connections:    Talks on phone: Not on  file    Gets together: Not on file    Attends religious service: Not on file    Active member of club or organization: Not on file    Attends meetings of clubs or organizations: Not on file    Relationship status: Not on file  Other Topics Concern  . Not on file  Social History Narrative  . Not on file    Family History  Problem Relation Age of Onset  . Diabetes Mother   . Cancer Father 20       Lung cancer  . Diabetes Sister   . Diabetes Brother   . Diabetes Sister   . Cancer Sister 20       breast cancer  . Cancer Maternal Aunt 50       breast cancer  . Cancer Maternal Grandmother        breast cancer     ROS Review of Systems See HPI Constitution: No fevers or chills No malaise No diaphoresis Skin: No rash or itching Eyes: no blurry vision, no double vision GU: no dysuria or hematuria Neuro: no dizziness or  headaches all others reviewed and negative   Objective: Vitals:   05/26/18 1020 05/26/18 1044  BP: (!) 161/96 (!) 138/92  Pulse: 100   Resp: 17   Temp: 98.7 F (37.1 C)   TempSrc: Oral   SpO2: 97%   Weight: 164 lb 9.6 oz (74.7 kg)   Height: 5' 3.19" (1.605 m)   Recheck BP 138/82  Physical Exam  General: alert, oriented, in NAD Head: normocephalic, atraumatic, no sinus tenderness Eyes: EOM intact, no scleral icterus or conjunctival injection Ears: TM clear bilaterally Nose: mucosa nonerythematous, nonedematous Throat: no pharyngeal exudate or erythema Lymph: no posterior auricular, submental or cervical lymph adenopathy Heart: normal rate, normal sinus rhythm, no murmurs Lungs: clear to auscultation bilaterally, no wheezing   Component     Latest Ref Rng & Units 05/26/2018  Rapid Strep A Screen     Negative Positive (A)    Assessment and Plan Rachel Roth was seen today for sore throat.  Diagnoses and all orders for this visit:  Strep pharyngitis-  Will treat with amoxicillin  Discussed common side effects of penicillins -     POCT rapid strep A  Essential hypertension- new diagnosis Reviewed bp trends Pt advised to do DASH diet Will do a trial of low dose amlodipine to treat her bp  Other orders -     fluconazole (DIFLUCAN) 150 MG tablet; Take 1 tablet (150 mg total) by mouth once for 1 dose. Repeat in 3 days for yeast infection -     amoxicillin (AMOXIL) 500 MG capsule; Take 1 capsule (500 mg total) by mouth 2 (two) times daily for 10 days. -     amLODipine (NORVASC) 5 MG tablet; Take 1 tablet (5 mg total) by mouth daily.  A total of 30 minutes were spent face-to-face with the patient during this encounter and over half of that time was spent on counseling and coordination of care.   Grady

## 2018-05-31 ENCOUNTER — Encounter: Payer: Self-pay | Admitting: Family Medicine

## 2018-05-31 ENCOUNTER — Ambulatory Visit: Payer: 59 | Admitting: Family Medicine

## 2018-05-31 VITALS — BP 122/74 | HR 95 | Temp 98.9°F | Resp 16 | Ht 63.0 in | Wt 162.2 lb

## 2018-05-31 DIAGNOSIS — I10 Essential (primary) hypertension: Secondary | ICD-10-CM

## 2018-05-31 DIAGNOSIS — R0981 Nasal congestion: Secondary | ICD-10-CM | POA: Diagnosis not present

## 2018-05-31 DIAGNOSIS — R6889 Other general symptoms and signs: Secondary | ICD-10-CM

## 2018-05-31 DIAGNOSIS — J020 Streptococcal pharyngitis: Secondary | ICD-10-CM

## 2018-05-31 LAB — POCT INFLUENZA A/B
Influenza A, POC: NEGATIVE
Influenza B, POC: NEGATIVE

## 2018-05-31 NOTE — Patient Instructions (Addendum)
  cepachol lozenge or burtz bees honey Vitamin C and zinc supplement   If you have lab work done today you will be contacted with your lab results within the next 2 weeks.  If you have not heard from [place] then please contact us. The fastest way to get your results is to register for My Chart.   IF you received an x-ray today, you will receive an invoice from [facility]. Please contact [facility] at [phone] with questions or concerns regarding your invoice.   IF you received labwork today, you will receive an invoice from [facility]. Please contact [facility] at [phone] with questions or concerns regarding your invoice.   Our billing staff will not be able to assist you with questions regarding bills from these companies.  You will be contacted with the lab results as soon as they are available. The fastest way to get your results is to activate your My Chart account. Instructions are located on the last page of this paperwork. If you have not heard from [place] regarding the results in 2 weeks, please contact this office.

## 2018-05-31 NOTE — Progress Notes (Signed)
Chief Complaint  Patient presents with  . Cough  . Headache  . Nasal Congestion    HPI   Follow up after positive strep throat on 05/26/28 She reports that she still is on the amoxicillin day 6 She states that she is taking her usual meds and mostly drinking tea because it feels better on her throat She states that she has a scratchy throat Cough and nasal congestion She is taking a cough medication that she took this morning  She has a nasal steroid flonase but does not use it She reports that she feels like she has lost her voice  She is watching her grandchild who is 3, he is not sick.   Hypertension  She started her amlodipine consistently BP Readings from Last 3 Encounters:  05/31/18 122/74  05/26/18 (!) 138/92  04/12/18 132/80   She denies dizziness , chest pains, lower extremity edema She is not eating much but drinking tea all day  Past Medical History:  Diagnosis Date  . Allergy   . Arthritis     Current Outpatient Medications  Medication Sig Dispense Refill  . amLODipine (NORVASC) 5 MG tablet Take 1 tablet (5 mg total) by mouth daily. 90 tablet 0  . amoxicillin (AMOXIL) 500 MG capsule Take 1 capsule (500 mg total) by mouth 2 (two) times daily for 10 days. 20 capsule 0  . BIOTIN 5000 PO Take by mouth.    . cyclobenzaprine (FLEXERIL) 5 MG tablet Take 1 tablet (5 mg total) by mouth 3 (three) times daily as needed. 90 tablet 1  . diclofenac sodium (VOLTAREN) 1 % GEL Apply 2 g topically 4 (four) times daily. 100 g 6  . venlafaxine XR (EFFEXOR-XR) 75 MG 24 hr capsule Take 3 capsules (225 mg total) by mouth daily with breakfast. 270 capsule 3  . gabapentin (NEURONTIN) 300 MG capsule Take 1 capsule (300 mg total) by mouth once for 1 dose. Office visit needed 90 capsule 0   No current facility-administered medications for this visit.     Allergies:  Allergies  Allergen Reactions  . Codeine Nausea And Vomiting  . Meloxicam Hypertension    Past Surgical History:   Procedure Laterality Date  . ABDOMINAL HYSTERECTOMY    . CESAREAN SECTION      Social History   Socioeconomic History  . Marital status: Married    Spouse name: Not on file  . Number of children: Not on file  . Years of education: Not on file  . Highest education level: Not on file  Occupational History  . Not on file  Social Needs  . Financial resource strain: Not on file  . Food insecurity:    Worry: Not on file    Inability: Not on file  . Transportation needs:    Medical: Not on file    Non-medical: Not on file  Tobacco Use  . Smoking status: Former Research scientist (life sciences)  . Smokeless tobacco: Never Used  Substance and Sexual Activity  . Alcohol use: No  . Drug use: No  . Sexual activity: Yes    Birth control/protection: None  Lifestyle  . Physical activity:    Days per week: Not on file    Minutes per session: Not on file  . Stress: Not on file  Relationships  . Social connections:    Talks on phone: Not on file    Gets together: Not on file    Attends religious service: Not on file    Active member of  club or organization: Not on file    Attends meetings of clubs or organizations: Not on file    Relationship status: Not on file  Other Topics Concern  . Not on file  Social History Narrative  . Not on file    Family History  Problem Relation Age of Onset  . Diabetes Mother   . Cancer Father 66       Lung cancer  . Diabetes Sister   . Diabetes Brother   . Diabetes Sister   . Cancer Sister 58       breast cancer  . Cancer Maternal Aunt 50       breast cancer  . Cancer Maternal Grandmother        breast cancer     ROS Review of Systems See HPI Constitution: see hpi No malaise No diaphoresis Skin: No rash or itching Eyes: no blurry vision, no double vision GU: no dysuria or hematuria Neuro: no dizziness or headaches all others reviewed and negative   Objective: Vitals:   05/31/18 1343  BP: 122/74  Pulse: 95  Resp: 16  Temp: 98.9 F (37.2 C)    TempSrc: Oral  SpO2: 97%  Weight: 162 lb 3.2 oz (73.6 kg)  Height: 5\' 3"  (1.6 m)    Physical Exam General: alert, oriented, in NAD Head: normocephalic, atraumatic, no sinus tenderness Eyes: EOM intact, no scleral icterus or conjunctival injection Ears: TM clear bilaterally, tm bulging with clear fluid Nose: mucosa nonerythematous, nonedematous Throat: no pharyngeal exudate or erythema Lymph: no posterior auricular, submental or cervical lymph adenopathy Heart: normal rate, normal sinus rhythm, no murmurs Lungs: clear to auscultation bilaterally, no wheezing  Rapid flu negative  Assessment and Plan Corrinna was seen today for cough, headache and nasal congestion.  Diagnoses and all orders for this visit:  Flu-like symptoms -     POCT Influenza A/B  Essential hypertension - continue amlodipine  Nasal congestion -  Start flonase daily for 2 weeks  Strep pharyngitis- complete amoxicillin Drink more water and rest      Nahshon Reich A Nolon Rod

## 2018-06-14 ENCOUNTER — Ambulatory Visit: Payer: 59 | Admitting: Physician Assistant

## 2018-06-14 DIAGNOSIS — J020 Streptococcal pharyngitis: Secondary | ICD-10-CM | POA: Diagnosis not present

## 2018-06-29 ENCOUNTER — Ambulatory Visit: Payer: 59 | Admitting: Family Medicine

## 2018-06-29 ENCOUNTER — Encounter: Payer: Self-pay | Admitting: Family Medicine

## 2018-06-29 ENCOUNTER — Other Ambulatory Visit: Payer: Self-pay

## 2018-06-29 VITALS — BP 143/82 | HR 78 | Temp 98.6°F | Resp 17 | Ht 63.0 in | Wt 165.6 lb

## 2018-06-29 DIAGNOSIS — R748 Abnormal levels of other serum enzymes: Secondary | ICD-10-CM | POA: Diagnosis not present

## 2018-06-29 DIAGNOSIS — I10 Essential (primary) hypertension: Secondary | ICD-10-CM

## 2018-06-29 DIAGNOSIS — Z23 Encounter for immunization: Secondary | ICD-10-CM

## 2018-06-29 NOTE — Patient Instructions (Signed)
° ° ° °  If you have lab work done today you will be contacted with your lab results within the next 2 weeks.  If you have not heard from us then please contact us. The fastest way to get your results is to register for My Chart. ° ° °IF you received an x-ray today, you will receive an invoice from [facility]. Please contact  Radiology at [phone] with questions or concerns regarding your invoice.  ° °IF you received labwork today, you will receive an invoice from [facility]. Please contact [facility] at [phone] with questions or concerns regarding your invoice.  ° °Our billing staff will not be able to assist you with questions regarding bills from these companies. ° °You will be contacted with the lab results as soon as they are available. The fastest way to get your results is to activate your My Chart account. Instructions are located on the last page of this paperwork. If you have not heard from us regarding the results in 2 weeks, please contact this office. °  ° ° ° °

## 2018-06-29 NOTE — Progress Notes (Signed)
Chief Complaint  Patient presents with  . essential hypertension    1 month f/u    HPI   Hypertension: Patient here for follow-up of elevated blood pressure. She is exercising and is adherent to low salt diet.  Blood pressure is well controlled at home. Cardiac symptoms none. Patient denies chest pain, chest pressure/discomfort, claudication, dyspnea, exertional chest pressure/discomfort, fatigue, irregular heart beat and lower extremity edema.  Cardiovascular risk factors: hypertension. Use of agents associated with hypertension: NSAIDS. History of target organ damage: none. BP Readings from Last 3 Encounters:  06/29/18 (!) 143/82  05/31/18 122/74  05/26/18 (!) 138/92     Past Medical History:  Diagnosis Date  . Allergy   . Arthritis     Current Outpatient Medications  Medication Sig Dispense Refill  . amLODipine (NORVASC) 5 MG tablet Take 1 tablet (5 mg total) by mouth daily. 90 tablet 0  . BIOTIN 5000 PO Take by mouth.    . cyclobenzaprine (FLEXERIL) 5 MG tablet Take 1 tablet (5 mg total) by mouth 3 (three) times daily as needed. 90 tablet 1  . diclofenac sodium (VOLTAREN) 1 % GEL Apply 2 g topically 4 (four) times daily. 100 g 6  . venlafaxine XR (EFFEXOR-XR) 75 MG 24 hr capsule Take 3 capsules (225 mg total) by mouth daily with breakfast. 270 capsule 3  . gabapentin (NEURONTIN) 300 MG capsule Take 1 capsule (300 mg total) by mouth once for 1 dose. Office visit needed 90 capsule 0   No current facility-administered medications for this visit.     Allergies:  Allergies  Allergen Reactions  . Codeine Nausea And Vomiting  . Meloxicam Hypertension    Past Surgical History:  Procedure Laterality Date  . ABDOMINAL HYSTERECTOMY    . CESAREAN SECTION      Social History   Socioeconomic History  . Marital status: Married    Spouse name: Not on file  . Number of children: Not on file  . Years of education: Not on file  . Highest education level: Not on file    Occupational History  . Not on file  Social Needs  . Financial resource strain: Not on file  . Food insecurity:    Worry: Not on file    Inability: Not on file  . Transportation needs:    Medical: Not on file    Non-medical: Not on file  Tobacco Use  . Smoking status: Former Research scientist (life sciences)  . Smokeless tobacco: Never Used  Substance and Sexual Activity  . Alcohol use: No  . Drug use: No  . Sexual activity: Yes    Birth control/protection: None  Lifestyle  . Physical activity:    Days per week: Not on file    Minutes per session: Not on file  . Stress: Not on file  Relationships  . Social connections:    Talks on phone: Not on file    Gets together: Not on file    Attends religious service: Not on file    Active member of club or organization: Not on file    Attends meetings of clubs or organizations: Not on file    Relationship status: Not on file  Other Topics Concern  . Not on file  Social History Narrative  . Not on file    Family History  Problem Relation Age of Onset  . Diabetes Mother   . Cancer Father 35       Lung cancer  . Diabetes Sister   . Diabetes  Brother   . Diabetes Sister   . Cancer Sister 43       breast cancer  . Cancer Maternal Aunt 50       breast cancer  . Cancer Maternal Grandmother        breast cancer     ROS Review of Systems See HPI Constitution: No fevers or chills No malaise No diaphoresis Skin: No rash or itching Eyes: no blurry vision, no double vision GU: no dysuria or hematuria Neuro: no dizziness or headaches all others reviewed and negative   Objective: Vitals:   06/29/18 1109  BP: (!) 143/82  Pulse: 78  Resp: 17  Temp: 98.6 F (37 C)  TempSrc: Oral  SpO2: 97%  Weight: 165 lb 9.6 oz (75.1 kg)  Height: _0  (1.6 m)    Physical Exam General: alert, oriented, in NAD Head: normocephalic, atraumatic, no sinus tenderness Eyes: EOM intact, no scleral icterus or conjunctival injection Nose: mucosa  nonerythematous, nonedematous Throat: no pharyngeal exudate or erythema Lymph: no posterior auricular, submental or cervical lymph adenopathy Heart: normal rate, normal sinus rhythm, no murmurs Lungs: clear to auscultation bilaterally, no wheezing   Assessment and Plan Rachel Roth was seen today for essential hypertension.  Diagnoses and all orders for this visit:  Flu vaccine need -     Flu Vaccine QUAD 36+ mos IM  Essential hypertension- continue DASH diet and amlodipine -     Comprehensive metabolic panel  Elevated alkaline phosphatase level- alk phos 126 on last test July 2019 -     Comprehensive metabolic panel     Rachel Roth

## 2018-06-30 LAB — COMPREHENSIVE METABOLIC PANEL
ALT: 11 IU/L (ref 0–32)
AST: 11 IU/L (ref 0–40)
Albumin/Globulin Ratio: 2 (ref 1.2–2.2)
Albumin: 4.3 g/dL (ref 3.5–5.5)
Alkaline Phosphatase: 125 IU/L — ABNORMAL HIGH (ref 39–117)
BUN/Creatinine Ratio: 19 (ref 9–23)
BUN: 11 mg/dL (ref 6–24)
Bilirubin Total: 0.2 mg/dL (ref 0.0–1.2)
CO2: 28 mmol/L (ref 20–29)
Calcium: 9.4 mg/dL (ref 8.7–10.2)
Chloride: 102 mmol/L (ref 96–106)
Creatinine, Ser: 0.58 mg/dL (ref 0.57–1.00)
GFR calc Af Amer: 123 mL/min/{1.73_m2} (ref >59)
GFR calc non Af Amer: 107 mL/min/{1.73_m2} (ref >59)
Globulin, Total: 2.2 g/dL (ref 1.5–4.5)
Glucose: 90 mg/dL (ref 65–99)
Potassium: 4.2 mmol/L (ref 3.5–5.2)
Sodium: 143 mmol/L (ref 134–144)
Total Protein: 6.5 g/dL (ref 6.0–8.5)

## 2018-07-22 ENCOUNTER — Encounter: Payer: Self-pay | Admitting: Family Medicine

## 2018-07-22 DIAGNOSIS — D225 Melanocytic nevi of trunk: Secondary | ICD-10-CM | POA: Diagnosis not present

## 2018-07-22 DIAGNOSIS — L570 Actinic keratosis: Secondary | ICD-10-CM | POA: Diagnosis not present

## 2018-07-22 DIAGNOSIS — L820 Inflamed seborrheic keratosis: Secondary | ICD-10-CM | POA: Diagnosis not present

## 2018-07-22 DIAGNOSIS — C44719 Basal cell carcinoma of skin of left lower limb, including hip: Secondary | ICD-10-CM | POA: Diagnosis not present

## 2018-07-22 DIAGNOSIS — L814 Other melanin hyperpigmentation: Secondary | ICD-10-CM | POA: Diagnosis not present

## 2018-07-22 DIAGNOSIS — L821 Other seborrheic keratosis: Secondary | ICD-10-CM | POA: Diagnosis not present

## 2018-07-28 ENCOUNTER — Encounter: Payer: Self-pay | Admitting: Family Medicine

## 2018-07-28 DIAGNOSIS — C44719 Basal cell carcinoma of skin of left lower limb, including hip: Secondary | ICD-10-CM | POA: Diagnosis not present

## 2018-07-28 DIAGNOSIS — L080 Pyoderma: Secondary | ICD-10-CM | POA: Diagnosis not present

## 2018-08-11 DIAGNOSIS — C44719 Basal cell carcinoma of skin of left lower limb, including hip: Secondary | ICD-10-CM | POA: Diagnosis not present

## 2018-08-14 ENCOUNTER — Other Ambulatory Visit: Payer: Self-pay | Admitting: Family Medicine

## 2018-09-14 ENCOUNTER — Other Ambulatory Visit: Payer: Self-pay | Admitting: Family Medicine

## 2018-09-14 DIAGNOSIS — F329 Major depressive disorder, single episode, unspecified: Secondary | ICD-10-CM

## 2018-09-14 DIAGNOSIS — F4321 Adjustment disorder with depressed mood: Secondary | ICD-10-CM

## 2018-09-14 DIAGNOSIS — F419 Anxiety disorder, unspecified: Secondary | ICD-10-CM

## 2018-09-14 DIAGNOSIS — F32A Depression, unspecified: Secondary | ICD-10-CM

## 2018-09-22 DIAGNOSIS — L905 Scar conditions and fibrosis of skin: Secondary | ICD-10-CM | POA: Diagnosis not present

## 2018-09-22 DIAGNOSIS — Z85828 Personal history of other malignant neoplasm of skin: Secondary | ICD-10-CM | POA: Diagnosis not present

## 2018-10-05 ENCOUNTER — Ambulatory Visit: Payer: 59 | Admitting: Family Medicine

## 2018-10-05 ENCOUNTER — Other Ambulatory Visit: Payer: Self-pay

## 2018-10-05 ENCOUNTER — Encounter: Payer: Self-pay | Admitting: Family Medicine

## 2018-10-05 VITALS — BP 130/74 | HR 81 | Temp 98.8°F | Resp 17 | Ht 63.0 in | Wt 168.8 lb

## 2018-10-05 DIAGNOSIS — F3341 Major depressive disorder, recurrent, in partial remission: Secondary | ICD-10-CM

## 2018-10-05 DIAGNOSIS — I10 Essential (primary) hypertension: Secondary | ICD-10-CM

## 2018-10-05 MED ORDER — BUSPIRONE HCL 5 MG PO TABS: 5.0000 mg | ORAL_TABLET | Freq: Two times a day (BID) | ORAL | 1 refills | Status: DC | PRN

## 2018-10-05 NOTE — Patient Instructions (Addendum)
If you have lab work done today you will be contacted with your lab results within the next 2 weeks.  If you have not heard from [place] then please contact us. The fastest way to get your results is to register for My Chart.   IF you received an x-ray today, you will receive an invoice from [facility]. Please contact [facility] at [phone] with questions or concerns regarding your invoice.   IF you received labwork today, you will receive an invoice from [facility]. Please contact [facility] at [phone] with questions or concerns regarding your invoice.   Our billing staff will not be able to assist you with questions regarding bills from these companies.  You will be contacted with the lab results as soon as they are available. The fastest way to get your results is to activate your My Chart account. Instructions are located on the last page of this paperwork. If you have not heard from [place] regarding the results in 2 weeks, please contact this office.     Living With Seasonal Affective Disorder Seasonal affective disorder (SAD) is a type of depression that is also known as the "winter blues." It is a condition in which you feel depressed at specific times of the year. The most common times for this condition are late fall and winter. How to manage lifestyle changes Managing stress  Get 6-8 hours of sleep every night. To improve your sleep, make sure you: ? Keep your bedroom dark and cool. ? Go to sleep and wake up at about the same time every day. ? Limit screen time starting a few hours before bedtime.  Exercise regularly.  Avoid making major decisions until you feel better.  Practice techniques to manage your stress, such as: ? Mindfulness-based meditation. ? Focused breathing exercises. ? Relaxation exercises. ? Journaling.  Find activities that help you relax, like reading, getting a massage, or spending time with a friend.  Do mind-body exercises,  like yoga or tai chi. Managing treatment  Treatment for SAD includes talk therapy, light therapy, combination of both talk and light therapy, and antidepressant medicine. If you take medicine for SAD, avoid using alcohol and other substances that may prevent your medicines from working properly. It is also important to:  Talk with your pharmacist or health care provider about all the medicines that you take, their possible side effects, and what medicines are safe to take together.  Ask to be referred to a therapist who can help with SAD.  Use a light box for 30 minutes every morning. This should start in the early autumn and continue until spring.  Take part in all treatment decisions (shared decision-making). Give input on the side effects of medicines. Shared decision-making should be part of your total treatment plan.  Know when you can expect to start feeling better. Antidepressants take a while to start working. Let your provider know if you are not feeling better after the expected time.  Make sure you know all the side effects of treatment. Know which ones are serious enough to call your provider about.  Relationships  Educate your friends and family members about SAD so they can understand how it affects you.  Ask for support from friends and family members to help you manage SAD. Consider asking family members to participate in family therapy.  Consider joining a support group. This can help you express yourself and learn how others cope with their condition. General instructions  Do your  best to stay positive by remembering that SAD usually goes away after a few months.  Work with your health care providers on a treatment plan. Stick to your plan. Make a plan that includes routines such as talk therapy and light therapy.  Make your home and work environment sunny or bright. Open blinds. Spend as much time as possible outside. Move furniture closer to windows for exposure to  natural light. How to recognize changes in your condition As your SAD improves, you may:  Start to feel your mood get better.  Have more energy for daily activities.  Get back to your normal routines for sleeping and eating.  Think more clearly and be less agitated.  Feel better about yourself.  Feel joy and interest return. If your SAD gets worse, you may:  Avoid people and activities.  Feel more guilty and hopeless.  Not eat well, sleep well, or exercise. Follow these instructions at home:  Use a light box and spend as much time in natural daylight as you can.  Stay busy with activities that get you out of the house. Do activities that you enjoy. Consider making plans for a daily walk with friends or family.  Eat healthy foods, including plenty of fruits and vegetables. Limit foods that contain a lot of fat or sugar. Do not binge on sweets and other carbs.  Limit alcohol and caffeine as told by your health care provider.  Keep all follow-up visits as told by your health care provider. This is important. Where to find support Talking to others Consider joining an online or in-person support group for people with SAD. You can also talk with:  A therapist.  Your health care provider.  Friends and family.  On-line support group at FuneralShow.uy. Finances  Check with your insurance to make sure that treatment for SAD is covered.  If you have a problem paying for co-pays or counseling, ask to meet with a Education officer, museum for help.  If your medicines are expensive: ? Use a generic form of the medicine. This may be less expensive than a brand name medicine. ? Check to see whether the manufacturer offers assistance programs to patients who cannot afford their medicines. Where to find more information  American Psychological Association: https://taylor.info/.aspx  American Psychiatric Association:  psychiatry.org/patients-families/depression/seasonal-affective-disorder  [employer] of Mental Health: ReserveSpaces.se.shtml Contact a health care provider if:  Your symptoms get worse or do not get better.  You have trouble taking care of yourself.  You are using drugs or alcohol to cope with your symptoms.  You have side effects from medicines.  Your symptoms return. Get help right away if:  You have thoughts about hurting yourself or others. If you ever feel like you may hurt yourself or others, or have thoughts about taking your own life, get help right away. You can go to your nearest emergency department or call:  Your local emergency services (911 in the [place]  A suicide crisis helpline, such as the [facility] at 302-597-4174. This is open 24 hours a day. Summary  SAD is a type of depression that typically occurs during the fall and winter months when there is less daylight. It is rare but possible for SAD to happen during the summer.  SAD causes symptoms of depression that can range from mild to severe.  Lifestyle changes is an important part of managing SAD. Make a plan that includes routines to help you stay active and focused.  Follow your provider's  treatment plan, which may include talk therapy, light therapy, or use of medicines.  Get help right away if you feel like you might hurt yourself or others or have thoughts of taking your own life. The National Suicide Prevention Lifeline at 310-335-7851 is open 24 hours a day. This information is not intended to replace advice given to you by your health care provider. Make sure you discuss any questions you have with your health care provider. Document Released: 12/22/2017 Document Revised: 12/22/2017 Document Reviewed: 12/22/2017 Elsevier Interactive Patient Education  2019 [employer].

## 2018-10-05 NOTE — Progress Notes (Signed)
Established Patient Office Visit  Subjective:  Patient ID: Rachel Roth, female    DOB: 08-May-1966  Age: 53 y.o. MRN: [id number]  CC:  Chief Complaint  Patient presents with  . Hypertension    3 month f/u    HPI Rachel Roth presents for   Hypertension: Patient here for follow-up of elevated blood pressure. She is exercising and is not adherent to low salt diet.  Blood pressure is well controlled at home. Cardiac symptoms none. Patient denies chest pain, chest pressure/discomfort, exertional chest pressure/discomfort, irregular heart beat and lower extremity edema.   BP Readings from Last 3 Encounters:  10/05/18 130/74  06/29/18 (!) 143/82  05/31/18 122/74   Seasonal affective disorder She reports that she gets worsening moods during the winter She feels like she cannot get much accomplished right now She thinks it is the weather mostly  Depression screen [facility] 2/9 10/05/2018 06/29/2018 05/31/2018 05/26/2018 04/12/2018  Decreased Interest 3 0 0 0 0  Down, Depressed, Hopeless 1 0 0 0 0  PHQ - 2 Score 4 0 0 0 0  Altered sleeping 1 - - - -  Tired, decreased energy 3 - - - -  Change in appetite 0 - - - -  Feeling bad or failure about yourself  0 - - - -  Trouble concentrating 2 - - - -  Moving slowly or fidgety/restless 0 - - - -  Suicidal thoughts 0 - - - -  PHQ-9 Score 10 - - - -  Difficult doing work/chores Somewhat difficult - - - -     Past Medical History:  Diagnosis Date  . Allergy   . Arthritis     Past Surgical History:  Procedure Laterality Date  . ABDOMINAL HYSTERECTOMY    . CESAREAN SECTION      Family History  Problem Relation Age of Onset  . Diabetes Mother   . Cancer Father 70       Lung cancer  . Diabetes Sister   . Diabetes Brother   . Diabetes Sister   . Cancer Sister 50       breast cancer  . Cancer Maternal Aunt 50       breast cancer  . Cancer Maternal Grandmother        breast cancer    Social History   Socioeconomic  History  . Marital status: Married    Spouse name: Not on file  . Number of children: Not on file  . Years of education: Not on file  . Highest education level: Not on file  Occupational History  . Not on file  Social Needs  . Financial resource strain: Not on file  . Food insecurity:    Worry: Not on file    Inability: Not on file  . Transportation needs:    Medical: Not on file    Non-medical: Not on file  Tobacco Use  . Smoking status: Former Research scientist (life sciences)  . Smokeless tobacco: Never Used  Substance and Sexual Activity  . Alcohol use: No  . Drug use: No  . Sexual activity: Yes    Birth control/protection: None  Lifestyle  . Physical activity:    Days per week: Not on file    Minutes per session: Not on file  . Stress: Not on file  Relationships  . Social connections:    Talks on phone: Not on file    Gets together: Not on file    Attends religious service: Not on  file    Active member of club or organization: Not on file    Attends meetings of clubs or organizations: Not on file    Relationship status: Not on file  . Intimate partner violence:    Fear of current or ex partner: Not on file    Emotionally abused: Not on file    Physically abused: Not on file    Forced sexual activity: Not on file  Other Topics Concern  . Not on file  Social History Narrative  . Not on file    Outpatient Medications Prior to Visit  Medication Sig Dispense Refill  . amLODipine (NORVASC) 5 MG tablet TAKE 1 TABLET BY MOUTH EVERY DAY 90 tablet 1  . BIOTIN 5000 PO Take by mouth.    . cyclobenzaprine (FLEXERIL) 5 MG tablet Take 1 tablet (5 mg total) by mouth 3 (three) times daily as needed. 90 tablet 1  . diclofenac sodium (VOLTAREN) 1 % GEL Apply 2 g topically 4 (four) times daily. 100 g 6  . gabapentin (NEURONTIN) 300 MG capsule Take 1 capsule (300 mg total) by mouth daily. 90 capsule 1  . venlafaxine XR (EFFEXOR-XR) 75 MG 24 hr capsule TAKE 3 CAPSULES (225 MG TOTAL) BY MOUTH DAILY WITH  BREAKFAST. 90 capsule 1   No facility-administered medications prior to visit.     Allergies  Allergen Reactions  . Codeine Nausea And Vomiting  . Meloxicam Hypertension    ROS Review of Systems Review of Systems  Constitutional: Negative for activity change, appetite change, chills and fever.  HENT: Negative for congestion, nosebleeds, trouble swallowing and voice change.   Respiratory: Negative for cough, shortness of breath and wheezing.   Gastrointestinal: Negative for diarrhea, nausea and vomiting.  Genitourinary: Negative for difficulty urinating, dysuria, flank pain and hematuria.  Musculoskeletal: Negative for back pain, joint swelling and neck pain.  Neurological: Negative for dizziness, speech difficulty, light-headedness and numbness.  See HPI. All other review of systems negative.     Objective:    Physical Exam  BP 130/74 (BP Location: Right Arm, Patient Position: Sitting, Cuff Size: Normal)   Pulse 81   Temp 98.8 F (37.1 C) (Oral)   Resp 17   Ht 5\' 3"  (1.6 m)   Wt 168 lb 12.8 oz (76.6 kg)   SpO2 94%   BMI 29.90 kg/m  Wt Readings from Last 3 Encounters:  10/05/18 168 lb 12.8 oz (76.6 kg)  06/29/18 165 lb 9.6 oz (75.1 kg)  05/31/18 162 lb 3.2 oz (73.6 kg)   Physical Exam  Constitutional: Oriented to person, place, and time. Appears well-developed and well-nourished.  HENT:  Head: Normocephalic and atraumatic.  Eyes: Conjunctivae and EOM are normal.  Cardiovascular: Normal rate, regular rhythm, normal heart sounds and intact distal pulses.  No murmur heard. Pulmonary/Chest: Effort normal and breath sounds normal. No stridor. No respiratory distress. Has no wheezes.  Neurological: Is alert and oriented to person, place, and time.  Skin: Skin is warm. Capillary refill takes less than 2 seconds.  Psychiatric: Has a normal mood and affect. Behavior is normal. Judgment and thought content normal.    Health Maintenance Due  Topic Date Due  . HIV  Screening  11/27/1981  . MAMMOGRAM  11/26/2017    There are no preventive care reminders to display for this patient.  Lab Results  Component Value Date   TSH 1.580 03/19/2015   Lab Results  Component Value Date   WBC 6.3 11/18/2016   HGB 14.3  11/18/2016   HCT 41.2 11/18/2016   MCV 81.2 11/18/2016   PLT 331 10/26/2016   Lab Results  Component Value Date   NA 143 06/29/2018   K 4.2 06/29/2018   CO2 28 06/29/2018   GLUCOSE 90 06/29/2018   BUN 11 06/29/2018   CREATININE 0.58 06/29/2018   BILITOT 0.2 06/29/2018   ALKPHOS 125 (H) 06/29/2018   AST 11 06/29/2018   ALT 11 06/29/2018   PROT 6.5 06/29/2018   ALBUMIN 4.3 06/29/2018   CALCIUM 9.4 06/29/2018   Lab Results  Component Value Date   CHOL 213 (H) 04/12/2018   Lab Results  Component Value Date   HDL 49 04/12/2018   Lab Results  Component Value Date   LDLCALC 118 (H) 04/12/2018   Lab Results  Component Value Date   TRIG 228 (H) 04/12/2018   Lab Results  Component Value Date   CHOLHDL 4.3 04/12/2018   Lab Results  Component Value Date   HGBA1C 5.8 (A) 02/25/2018      Assessment & Plan:   Problem List Items Addressed This Visit      Other   Recurrent major depressive disorder, in partial remission ([place]) Continue venlafaxine  Added buspar to potentiate the effect of Venlafaxine   Relevant Medications   busPIRone (BUSPAR) 5 MG tablet    Other Visit Diagnoses    Essential hypertension    -  Primary Patient's blood pressure is at goal of 139/89 or less. Condition is stable. Continue current medications and treatment plan. I recommend that you exercise for 30-45 minutes 5 days a week. I also recommend a balanced diet with fruits and vegetables every day, lean meats, and little fried foods. The DASH diet (you can find this online) is a good example of this.       Meds ordered this encounter  Medications  . busPIRone (BUSPAR) 5 MG tablet    Sig: Take 1 tablet (5 mg total) by mouth 2 (two) times  daily as needed.    Dispense:  60 tablet    Refill:  1    Follow-up: Return in about 3 months (around 01/04/2019) for anxiety and depression .    Forrest Moron, MD

## 2018-10-27 ENCOUNTER — Telehealth: Payer: Self-pay | Admitting: Family Medicine

## 2018-10-27 NOTE — Telephone Encounter (Signed)
Called and spoke with pt. Due to Dr. Nolon Rod being out of the office on 01/05/19, pt was needing to be rescheduled. I was able to get the patient rescheduled for 01/06/19 at 10:00 pm. I advised of time, building, late policy. Pt acknowledged.

## 2018-11-08 ENCOUNTER — Ambulatory Visit (INDEPENDENT_AMBULATORY_CARE_PROVIDER_SITE_OTHER): Payer: 59

## 2018-11-08 ENCOUNTER — Ambulatory Visit: Payer: 59 | Admitting: Family Medicine

## 2018-11-08 ENCOUNTER — Encounter: Payer: Self-pay | Admitting: Family Medicine

## 2018-11-08 VITALS — BP 133/77 | HR 78 | Temp 98.1°F | Resp 14 | Ht 63.0 in | Wt 169.6 lb

## 2018-11-08 DIAGNOSIS — M25522 Pain in left elbow: Secondary | ICD-10-CM | POA: Diagnosis not present

## 2018-11-08 DIAGNOSIS — G8929 Other chronic pain: Secondary | ICD-10-CM

## 2018-11-08 DIAGNOSIS — M25521 Pain in right elbow: Secondary | ICD-10-CM

## 2018-11-08 NOTE — Progress Notes (Signed)
Subjective:    Patient ID: Rachel Roth, female    DOB: 1966/09/27, 53 y.o.   MRN: [id number]  HPI  Rachel Roth is a 53 y.o. female Presents today for: Chief Complaint  Patient presents with  . Elbow Pain    both elbow hurts but the right elbow has been the worse in the last few wks. No injury Patient states no other issus at this time   Presents with bilateral elbow pain, right worse than left - more sore past few weeks.  Noted some soreness since last summer. Current pain past month. Stiff in the morning. Hurts most in the morning. Trouble picking things up in the morning.  Whole elbow hurts, but most sore on outside of elbow.   Has RLS, takes gabapentin.   Has tried the voltaren gel to elbow for a few times - no relief. Tried elbow splint - 1 day only.  Stays at home - taking care of grandson.  Regular housecleaning - no new activities.  L hand dominant.    Patient Active Problem List   Diagnosis Date Noted  . Recurrent major depressive disorder, in partial remission ([place]) 04/12/2018  . Arthritis 10/25/2016  . RLS (restless legs syndrome) 03/19/2015  . Lower back pain 07/09/2012  . Hip pain 07/09/2012  . Cervical radiculopathy 05/29/2012  . Anxiety 05/29/2012  . Insomnia 05/29/2012  . BMI 31.0-31.9,adult 05/29/2012  . Depression 12/17/2011   Past Medical History:  Diagnosis Date  . Allergy   . Arthritis    Past Surgical History:  Procedure Laterality Date  . ABDOMINAL HYSTERECTOMY    . CESAREAN SECTION     Allergies  Allergen Reactions  . Codeine Nausea And Vomiting  . Meloxicam Hypertension   Prior to Admission medications   Medication Sig Start Date End Date Taking? Authorizing Provider  amLODipine (NORVASC) 5 MG tablet TAKE 1 TABLET BY MOUTH EVERY DAY 08/15/18   Delia Chimes A, Rachel Roth  BIOTIN 5000 PO Take by mouth.    Provider, Historical, Rachel Roth  busPIRone (BUSPAR) 5 MG tablet Take 1 tablet (5 mg total) by mouth 2 (two) times daily as needed.  10/05/18   Forrest Moron, Rachel Roth  diclofenac sodium (VOLTAREN) 1 % GEL Apply 2 g topically 4 (four) times daily. 04/12/18   Forrest Moron, Rachel Roth  gabapentin (NEURONTIN) 300 MG capsule Take 1 capsule (300 mg total) by mouth daily. 08/15/18   Forrest Moron, Rachel Roth  venlafaxine XR (EFFEXOR-XR) 75 MG 24 hr capsule TAKE 3 CAPSULES (225 MG TOTAL) BY MOUTH DAILY WITH BREAKFAST. 09/14/18   Forrest Moron, Rachel Roth   Social History   Socioeconomic History  . Marital status: Married    Spouse name: Not on file  . Number of children: Not on file  . Years of education: Not on file  . Highest education level: Not on file  Occupational History  . Not on file  Social Needs  . Financial resource strain: Not on file  . Food insecurity:    Worry: Not on file    Inability: Not on file  . Transportation needs:    Medical: Not on file    Non-medical: Not on file  Tobacco Use  . Smoking status: Former Research scientist (life sciences)  . Smokeless tobacco: Never Used  Substance and Sexual Activity  . Alcohol use: No  . Drug use: No  . Sexual activity: Yes    Birth control/protection: None  Lifestyle  . Physical activity:    Days per  week: Not on file    Minutes per session: Not on file  . Stress: Not on file  Relationships  . Social connections:    Talks on phone: Not on file    Gets together: Not on file    Attends religious service: Not on file    Active member of club or organization: Not on file    Attends meetings of clubs or organizations: Not on file    Relationship status: Not on file  . Intimate partner violence:    Fear of current or ex partner: Not on file    Emotionally abused: Not on file    Physically abused: Not on file    Forced sexual activity: Not on file  Other Topics Concern  . Not on file  Social History Narrative  . Not on file    Review of Systems Per HPI.      Objective:   Physical Exam Constitutional:      General: She is not in acute distress.    Appearance: She is well-developed.    HENT:     Head: Normocephalic and atraumatic.  Cardiovascular:     Rate and Rhythm: Normal rate.  Pulmonary:     Effort: Pulmonary effort is normal.  Musculoskeletal:     Right elbow: She exhibits decreased range of motion (pain at terminal flexion. ). She exhibits no swelling, no effusion, no deformity and no laceration. Tenderness found. Medial epicondyle and lateral epicondyle tenderness noted. No radial head and no olecranon process tenderness noted.     Left elbow: She exhibits normal range of motion, no swelling, no effusion and no laceration. Tenderness found. Medial epicondyle tenderness noted. No radial head, no lateral epicondyle and no olecranon process tenderness noted.  Neurological:     Mental Status: She is alert and oriented to person, place, and time.    Vitals:   11/08/18 1322  BP: 133/77  Pulse: 78  Resp: 14  Temp: 98.1 F (36.7 C)  TempSrc: Oral  SpO2: 96%  Weight: 169 lb 9.6 oz (76.9 kg)  Height: 5\' 3"  (1.6 m)   Dg Elbow Complete Left (3+view)  Result Date: 11/08/2018 CLINICAL DATA:  Chronic six-month history of LEFT elbow pain localizing to the MEDIAL epicondyle. No known injuries. EXAM: LEFT ELBOW - COMPLETE 3+ VIEW COMPARISON:  None. FINDINGS: No evidence of acute, subacute or healed fractures. Well-preserved joint spaces. Tiny spur arising from the coronoid process of the ulna. Minimal calcification at the insertion of the triceps tendon on the olecranon. No other intrinsic osseous abnormalities. Well-preserved bone mineral density. No POSTERIOR fat pad sign. IMPRESSION: No acute or subacute osseous abnormality. Electronically Signed   By: Evangeline Dakin M.D.   On: 11/08/2018 14:25   Dg Elbow Complete Right  Result Date: 11/08/2018 CLINICAL DATA:  Right elbow chronic pain. EXAM: RIGHT ELBOW - COMPLETE 3+ VIEW COMPARISON:  None. FINDINGS: There is no evidence of fracture, dislocation, or joint effusion. Mild osteophytosis is identified in the proximal ulna.  Soft tissues are unremarkable. IMPRESSION: No acute abnormality. Mild osteophytosis identified in the proximal ulna. Electronically Signed   By: Abelardo Diesel M.D.   On: 11/08/2018 13:41   Results for orders placed or performed in visit on 11/08/18  Sedimentation rate  Result Value Ref Range   Sed Rate 15 0 - 40 mm/hr       Assessment & Plan:  UMEKA WRENCH is a 53 y.o. female Elbow pain, chronic, right - Plan: Sedimentation rate,  DG Elbow Complete Right, Apply other splint, Ambulatory referral to Hand Surgery  Left elbow pain - Plan: DG ELBOW COMPLETE LEFT (3+VIEW), Ambulatory referral to Hand Surgery  Bilateral elbow pain, right greater than left.  Does have primarily what appears to be lateral epicondylosis on the right, medial epicondylosis on the left.  However does have some burning pain in other parts of her elbow.  X-rays with some degenerative type changes but no concerning findings, sed rate reassuring.  Still suspect some overuse tendinopathy.  -Counterforce brace applied for right elbow and discussed activity modification including avoidance of downward grasp carrying.   -Refer to hand/orthopedic specialist to decide on further treatment and whether or not injections may be beneficial versus physical therapy.  No orders of the defined types were placed in this encounter.  Patient Instructions    Although there are a few areas of discomfort in your right elbow, I do think you have a component of lateral epicondylitis or tennis elbow.  Can try the splint as we discussed, but definitely try to look to see if there are any repetitive activities that may be making that elbow pain as well as left elbow pain worse. Avoid repetitive grasping and carrying as much as possible for now.     I did place a referral to orthopedics/hand specialist to evaluate your elbow pain further.  If there are any concerns on the left elbow x-Roth I will let you know.  There were a few possible  degenerative changes on the right elbow, but no other concerning findings.  Thank you for coming in today   If you have lab work done today you will be contacted with your lab results within the next 2 weeks.  If you have not heard from [place] then please contact us. The fastest way to get your results is to register for My Chart.   IF you received an x-Roth today, you will receive an invoice from [facility]. Please contact [facility] at [phone] with questions or concerns regarding your invoice.   IF you received labwork today, you will receive an invoice from [facility]. Please contact [facility] at [phone] with questions or concerns regarding your invoice.   Our billing staff will not be able to assist you with questions regarding bills from these companies.  You will be contacted with the lab results as soon as they are available. The fastest way to get your results is to activate your My Chart account. Instructions are located on the last page of this paperwork. If you have not heard from [place] regarding the results in 2 weeks, please contact this office.       Signed,   Rachel Ray, Rachel Roth [facility] [place].  11/09/18 2:49 PM

## 2018-11-08 NOTE — Patient Instructions (Addendum)
  Although there are a few areas of discomfort in your right elbow, I do think you have a component of lateral epicondylitis or tennis elbow.  Can try the splint as we discussed, but definitely try to look to see if there are any repetitive activities that may be making that elbow pain as well as left elbow pain worse. Avoid repetitive grasping and carrying as much as possible for now.     I did place a referral to orthopedics/hand specialist to evaluate your elbow pain further.  If there are any concerns on the left elbow x-ray I will let you know.  There were a few possible degenerative changes on the right elbow, but no other concerning findings.  Thank you for coming in today   If you have lab work done today you will be contacted with your lab results within the next 2 weeks.  If you have not heard from [place] then please contact us. The fastest way to get your results is to register for My Chart.   IF you received an x-ray today, you will receive an invoice from [facility]. Please contact [facility] at [phone] with questions or concerns regarding your invoice.   IF you received labwork today, you will receive an invoice from [facility]. Please contact [facility] at [phone] with questions or concerns regarding your invoice.   Our billing staff will not be able to assist you with questions regarding bills from these companies.  You will be contacted with the lab results as soon as they are available. The fastest way to get your results is to activate your My Chart account. Instructions are located on the last page of this paperwork. If you have not heard from [place] regarding the results in 2 weeks, please contact this office.

## 2018-11-09 ENCOUNTER — Encounter: Payer: Self-pay | Admitting: Family Medicine

## 2018-11-09 LAB — SEDIMENTATION RATE: Sed Rate: 15 mm/hr (ref 0–40)

## 2018-11-11 ENCOUNTER — Other Ambulatory Visit: Payer: Self-pay | Admitting: Family Medicine

## 2018-11-11 DIAGNOSIS — F4321 Adjustment disorder with depressed mood: Secondary | ICD-10-CM

## 2018-11-11 DIAGNOSIS — F32A Depression, unspecified: Secondary | ICD-10-CM

## 2018-11-11 DIAGNOSIS — F419 Anxiety disorder, unspecified: Secondary | ICD-10-CM

## 2018-11-11 DIAGNOSIS — F329 Major depressive disorder, single episode, unspecified: Secondary | ICD-10-CM

## 2018-11-11 NOTE — Telephone Encounter (Signed)
Requested Prescriptions  Pending Prescriptions Disp Refills  . venlafaxine XR (EFFEXOR-XR) 75 MG 24 hr capsule [Pharmacy Med Name: VENLAFAXINE HCL ER 75 MG CAP] 90 capsule 0    Sig: TAKE 3 CAPSULES (225 MG TOTAL) BY MOUTH DAILY WITH BREAKFAST.     Psychiatry: Antidepressants - SNRI - desvenlafaxine & venlafaxine Failed - 11/11/2018  1:09 AM      Failed - LDL in normal range and within 360 days    LDL Calculated  Date Value Ref Range Status  04/12/2018 118 (H) 0 - 99 mg/dL Final         Failed - Total Cholesterol in normal range and within 360 days    Cholesterol, Total  Date Value Ref Range Status  04/12/2018 213 (H) 100 - 199 mg/dL Final         Failed - Triglycerides in normal range and within 360 days    Triglycerides  Date Value Ref Range Status  04/12/2018 228 (H) 0 - 149 mg/dL Final         Passed - Completed PHQ-2 or PHQ-9 in the last 360 days.      Passed - Last BP in normal range    BP Readings from Last 1 Encounters:  11/08/18 133/77         Passed - Valid encounter within last 6 months    Recent Outpatient Visits          3 days ago Elbow pain, chronic, right   [facility] at [facility], Ranell Patrick, MD   1 month ago Essential hypertension   Primary Care at [facility], Arlie Solomons, MD   4 months ago Essential hypertension   [facility] at [place], Arlie Solomons, MD   5 months ago Flu-like symptoms   Primary Care at [facility], Arlie Solomons, MD   5 months ago Essential hypertension   [facility] at [place], MD      Future Appointments            In 1 month Forrest Moron, MD [facility] at [place], [address]

## 2018-11-14 ENCOUNTER — Encounter (INDEPENDENT_AMBULATORY_CARE_PROVIDER_SITE_OTHER): Payer: Self-pay | Admitting: Family Medicine

## 2018-11-14 ENCOUNTER — Ambulatory Visit (INDEPENDENT_AMBULATORY_CARE_PROVIDER_SITE_OTHER): Payer: 59 | Admitting: Family Medicine

## 2018-11-14 DIAGNOSIS — M25522 Pain in left elbow: Secondary | ICD-10-CM | POA: Diagnosis not present

## 2018-11-14 DIAGNOSIS — M25521 Pain in right elbow: Secondary | ICD-10-CM | POA: Diagnosis not present

## 2018-11-14 MED ORDER — CELECOXIB 200 MG PO CAPS: 200.0000 mg | ORAL_CAPSULE | Freq: Two times a day (BID) | ORAL | 6 refills | Status: DC | PRN

## 2018-11-14 NOTE — Progress Notes (Signed)
Office Visit Note   Patient: Rachel Roth           Date of Birth: April 08, 1966           MRN: [id number] Visit Date: 11/14/2018 Requested by: Wendie Agreste, MD [address] [place], [place] [address] PCP: Forrest Moron, MD  Subjective: Chief Complaint  Patient presents with  . Right Elbow - Pain    Been hurting a while, but worse over the past month.  NKI, Hurts to straighten arm. Left-hand dominant. +n/t at times in hand.  . Left Elbow - Pain    Pain medial aspect. NKI. Been hurting same amount of time as the right. +numbness/tingling in fingers of the hand.    HPI: She is here with right greater than left elbow pain.  He is left-hand dominant.  He states that for several months her right elbow has been hurting on the lateral aspect.  She also gets occasional anterior pain and stiffness in her elbow.  She has been wearing a tennis elbow strap and using Voltaren gel with only slight relief of symptoms.  Pain limits her ability to lift things or to fully extend her arm.  Her left elbow started hurting recently, mostly on the medial side.  Pain is not yet severe.  She had x-rays done recently showing slight spurring of the coronoid process of both elbows but were otherwise unremarkable.  Sed rate was normal.               ROS: She has a history of anxiety and depression, restless leg syndrome.  Other systems were reviewed and are negative.  Objective: Vital Signs: There were no vitals taken for this visit.  Physical Exam:  Right elbow: Full active range of motion with flexion, extension, forearm pronation and supination.  She has pain at the extremes.  Exquisitely tender at the common extensor tendon at the lateral epicondyle.  No pain at the radial tunnel, no pain the medial epicondyle. Left elbow: Full active range of motion, slight tenderness at the common flexor tendon at the medial epicondyle.  No significant pain on the lateral side.  No elbow effusion on either  side.  Imaging: Musculoskeletal ultrasound: Right lateral elbow was imaged and shows tendinopathy of the common extensor tendon with partial tearing at the attachment.  No hyperemia on power Doppler imaging.   Assessment & Plan: 1.  Right elbow pain due to lateral epicondylitis with partial tearing -We will add Celebrex as needed, she will watch her blood pressure closely while taking this.  Home exercises given today. -If pain does not improve she wants to try a cortisone injection prior to kayak season. - OT would be another option.  2.  Left elbow medial epicondylitis -Symptoms are mild.  Same treatment as above.   Follow-Up Instructions: No follow-ups on file.      Procedures: No procedures performed  No notes on file    PMFS History: Patient Active Problem List   Diagnosis Date Noted  . Recurrent major depressive disorder, in partial remission ([place]) 04/12/2018  . Arthritis 10/25/2016  . RLS (restless legs syndrome) 03/19/2015  . Lower back pain 07/09/2012  . Hip pain 07/09/2012  . Cervical radiculopathy 05/29/2012  . Anxiety 05/29/2012  . Insomnia 05/29/2012  . BMI 31.0-31.9,adult 05/29/2012  . Depression 12/17/2011   Past Medical History:  Diagnosis Date  . Allergy   . Arthritis     Family History  Problem Relation Age of Onset  .  Diabetes Mother   . Cancer Father 85       Lung cancer  . Diabetes Sister   . Diabetes Brother   . Diabetes Sister   . Cancer Sister 8       breast cancer  . Cancer Maternal Aunt 50       breast cancer  . Cancer Maternal Grandmother        breast cancer    Past Surgical History:  Procedure Laterality Date  . ABDOMINAL HYSTERECTOMY    . CESAREAN SECTION     Social History   Occupational History  . Not on file  Tobacco Use  . Smoking status: Former Research scientist (life sciences)  . Smokeless tobacco: Never Used  Substance and Sexual Activity  . Alcohol use: No  . Drug use: No  . Sexual activity: Yes    Birth control/protection: None

## 2018-11-25 ENCOUNTER — Other Ambulatory Visit: Payer: Self-pay | Admitting: Family Medicine

## 2018-11-25 DIAGNOSIS — F3341 Major depressive disorder, recurrent, in partial remission: Secondary | ICD-10-CM

## 2018-11-25 NOTE — Telephone Encounter (Signed)
Requested medication (s) are due for refill today: yes  Requested medication (s) are on the active medication list: yes  Last refill:  10/05/2018  Future visit scheduled: yes  Notes to clinic:  Pharmacy requesting Dx Code    Requested Prescriptions  Pending Prescriptions Disp Refills   busPIRone (BUSPAR) 5 MG tablet [Pharmacy Med Name: BUSPIRONE HCL 5 MG TABLET] 180 tablet 1    Sig: Take 1 tablet (5 mg total) by mouth 2 (two) times daily as needed.     Psychiatry: Anxiolytics/Hypnotics - Non-controlled Passed - 11/25/2018  2:36 PM      Passed - Valid encounter within last 6 months    Recent Outpatient Visits          2 weeks ago Elbow pain, chronic, right   [facility] at [facility], Ranell Patrick, MD   1 month ago Essential hypertension   Primary Care at [facility], Arlie Solomons, MD   4 months ago Essential hypertension   Primary Care at [facility], Arlie Solomons, MD   5 months ago Flu-like symptoms   Primary Care at [facility], Arlie Solomons, MD   6 months ago Essential hypertension   Primary Care at [place], MD      Future Appointments            In 1 month Forrest Moron, MD Primary Care at [place], [facility]

## 2018-11-29 NOTE — Telephone Encounter (Signed)
Patient is requesting a refill of the following medications: Requested Prescriptions   Pending Prescriptions Disp Refills  . busPIRone (BUSPAR) 5 MG tablet [Pharmacy Med Name: BUSPIRONE HCL 5 MG TABLET] 180 tablet 1    Sig: Take 1 tablet (5 mg total) by mouth 2 (two) times daily as needed.    Date of patient request:  11/25/2018 Last office visit: 11/08/18 for elbow pain  And on 10/05/18 depressive disorder Date of last refill: 60 tab with one refill 10/05/2018 Last refill amount: 60 tab with one refill Follow up time period per chart: 01/06/2019

## 2018-12-03 ENCOUNTER — Other Ambulatory Visit: Payer: Self-pay | Admitting: Family Medicine

## 2018-12-03 DIAGNOSIS — F329 Major depressive disorder, single episode, unspecified: Secondary | ICD-10-CM

## 2018-12-03 DIAGNOSIS — F4321 Adjustment disorder with depressed mood: Secondary | ICD-10-CM

## 2018-12-03 DIAGNOSIS — F32A Depression, unspecified: Secondary | ICD-10-CM

## 2018-12-03 DIAGNOSIS — F419 Anxiety disorder, unspecified: Secondary | ICD-10-CM

## 2018-12-05 NOTE — Telephone Encounter (Signed)
Requested Prescriptions  Pending Prescriptions Disp Refills  . venlafaxine XR (EFFEXOR-XR) 75 MG 24 hr capsule [Pharmacy Med Name: VENLAFAXINE HCL ER 75 MG CAP] 90 capsule 0    Sig: TAKE 3 CAPSULES (225 MG TOTAL) BY MOUTH DAILY WITH BREAKFAST.     Psychiatry: Antidepressants - SNRI - desvenlafaxine & venlafaxine Failed - 12/03/2018 12:48 AM      Failed - LDL in normal range and within 360 days    LDL Calculated  Date Value Ref Range Status  04/12/2018 118 (H) 0 - 99 mg/dL Final         Failed - Total Cholesterol in normal range and within 360 days    Cholesterol, Total  Date Value Ref Range Status  04/12/2018 213 (H) 100 - 199 mg/dL Final         Failed - Triglycerides in normal range and within 360 days    Triglycerides  Date Value Ref Range Status  04/12/2018 228 (H) 0 - 149 mg/dL Final         Passed - Completed PHQ-2 or PHQ-9 in the last 360 days.      Passed - Last BP in normal range    BP Readings from Last 1 Encounters:  11/08/18 133/77         Passed - Valid encounter within last 6 months    Recent Outpatient Visits          3 weeks ago Elbow pain, chronic, right   [facility] at [facility], Ranell Patrick, MD   2 months ago Essential hypertension   Primary Care at [facility], Arlie Solomons, MD   5 months ago Essential hypertension   Primary Care at [facility], Arlie Solomons, MD   6 months ago Flu-like symptoms   Primary Care at [place], MD   6 months ago Essential hypertension   Primary Care at [place], MD      Future Appointments            In 1 month Forrest Moron, MD Primary Care at [place], [facility]

## 2018-12-27 ENCOUNTER — Other Ambulatory Visit: Payer: Self-pay | Admitting: Family Medicine

## 2018-12-27 DIAGNOSIS — F329 Major depressive disorder, single episode, unspecified: Secondary | ICD-10-CM

## 2018-12-27 DIAGNOSIS — F4321 Adjustment disorder with depressed mood: Secondary | ICD-10-CM

## 2018-12-27 DIAGNOSIS — F419 Anxiety disorder, unspecified: Secondary | ICD-10-CM

## 2018-12-27 DIAGNOSIS — F32A Depression, unspecified: Secondary | ICD-10-CM

## 2018-12-30 ENCOUNTER — Telehealth: Payer: Self-pay | Admitting: Family Medicine

## 2018-12-30 NOTE — Telephone Encounter (Signed)
Copied from [place] [id number]. Topic: Quick Communication - See Telephone Encounter >> Dec 30, 2018  5:44 PM Blase Mess A wrote: CRM for notification. See Telephone encounter for: 12/30/18.  Patient called back. She is fine with a virtual visit or a telephone visit. Telephone & email verified.

## 2018-12-30 NOTE — Telephone Encounter (Signed)
Called pt left VM for her to call us to convert her upcoming appt to a webex or telemed visit the patient visit for 01/06/2019    FR

## 2019-01-04 ENCOUNTER — Ambulatory Visit: Payer: 59 | Admitting: Family Medicine

## 2019-01-05 ENCOUNTER — Ambulatory Visit: Payer: 59 | Admitting: Family Medicine

## 2019-01-06 ENCOUNTER — Ambulatory Visit (INDEPENDENT_AMBULATORY_CARE_PROVIDER_SITE_OTHER): Payer: 59 | Admitting: Family Medicine

## 2019-01-06 ENCOUNTER — Telehealth: Payer: Self-pay | Admitting: Family Medicine

## 2019-01-06 ENCOUNTER — Other Ambulatory Visit: Payer: Self-pay

## 2019-01-06 DIAGNOSIS — F3341 Major depressive disorder, recurrent, in partial remission: Secondary | ICD-10-CM

## 2019-01-06 DIAGNOSIS — R7303 Prediabetes: Secondary | ICD-10-CM

## 2019-01-06 DIAGNOSIS — F419 Anxiety disorder, unspecified: Secondary | ICD-10-CM

## 2019-01-06 DIAGNOSIS — F32A Depression, unspecified: Secondary | ICD-10-CM

## 2019-01-06 DIAGNOSIS — I10 Essential (primary) hypertension: Secondary | ICD-10-CM

## 2019-01-06 DIAGNOSIS — Z114 Encounter for screening for human immunodeficiency virus [HIV]: Secondary | ICD-10-CM

## 2019-01-06 DIAGNOSIS — M1990 Unspecified osteoarthritis, unspecified site: Secondary | ICD-10-CM | POA: Diagnosis not present

## 2019-01-06 DIAGNOSIS — Z23 Encounter for immunization: Secondary | ICD-10-CM

## 2019-01-06 DIAGNOSIS — F329 Major depressive disorder, single episode, unspecified: Secondary | ICD-10-CM

## 2019-01-06 MED ORDER — DICLOFENAC SODIUM 1 % TD GEL: 2.0000 g | Freq: Four times a day (QID) | TRANSDERMAL | 6 refills | Status: DC

## 2019-01-06 MED ORDER — VENLAFAXINE HCL ER 75 MG PO CP24: 225.0000 mg | ORAL_CAPSULE | Freq: Every day | ORAL | 0 refills | Status: DC

## 2019-01-06 MED ORDER — BUSPIRONE HCL 5 MG PO TABS: 5.0000 mg | ORAL_TABLET | Freq: Two times a day (BID) | ORAL | 1 refills | Status: DC | PRN

## 2019-01-06 MED ORDER — AMLODIPINE BESYLATE 5 MG PO TABS: 5.0000 mg | ORAL_TABLET | Freq: Every day | ORAL | 1 refills | Status: DC

## 2019-01-06 NOTE — Patient Instructions (Signed)
° ° ° °  If you have lab work done today you will be contacted with your lab results within the next 2 weeks.  If you have not heard from us then please contact us. The fastest way to get your results is to register for My Chart. ° ° °IF you received an x-ray today, you will receive an invoice from [facility]. Please contact  Radiology at [phone] with questions or concerns regarding your invoice.  ° °IF you received labwork today, you will receive an invoice from [facility]. Please contact [facility] at [phone] with questions or concerns regarding your invoice.  ° °Our billing staff will not be able to assist you with questions regarding bills from these companies. ° °You will be contacted with the lab results as soon as they are available. The fastest way to get your results is to activate your My Chart account. Instructions are located on the last page of this paperwork. If you have not heard from us regarding the results in 2 weeks, please contact this office. °  ° ° ° °

## 2019-01-06 NOTE — Progress Notes (Signed)
Telemedicine Encounter- SOAP NOTE Established Patient  This telephone encounter was conducted with the patient's (or proxy's) verbal consent via audio telecommunications: yes/no: Yes Patient was instructed to have this encounter in a suitably private space; and to only have persons present to whom they give permission to participate. In addition, patient identity was confirmed by use of name plus two identifiers (DOB and address).  I discussed the limitations, risks, security and privacy concerns of performing an evaluation and management service by telephone and the availability of in person appointments. I also discussed with the patient that there may be a patient responsible charge related to this service. The patient expressed understanding and agreed to proceed.  I spent a total of TIME; 0 MIN TO 60 MIN: 25 minutes talking with the patient or their proxy.  Chief Complaint  Patient presents with  . Anxiety    3 month f/u    Subjective   Rachel Roth is a 53 y.o. established patient. Telephone visit today for  HPI  Anxiety She reports that her mood is so much better now that the weather is better She denies panic attacks or anxiety  She states that despite the covid pandemic her mood is better  Depression screen Rachel Roth 2/9 01/06/2019 11/08/2018 10/05/2018 06/29/2018 05/31/2018  Decreased Interest 0 0 3 0 0  Down, Depressed, Hopeless 0 0 1 0 0  PHQ - 2 Score 0 0 4 0 0  Altered sleeping - - 1 - -  Tired, decreased energy - - 3 - -  Change in appetite - - 0 - -  Feeling bad or failure about yourself  - - 0 - -  Trouble concentrating - - 2 - -  Moving slowly or fidgety/restless - - 0 - -  Suicidal thoughts - - 0 - -  PHQ-9 Score - - 10 - -  Difficult doing work/chores - - Somewhat difficult - -   Arthritis She is left handed She reports that she has 3 tears in the ligament of the right elbow She states that she has some limitations since she has pain in the right elbow  She takes some meloxicam for pain She also has knee pains going up the steps She has stiffness in her hands  She has good grip strength She states that the meloxicam helped her  BP Readings from Last 3 Encounters:  11/08/18 133/77  10/05/18 130/74  06/29/18 (!) 143/82   She reports that the Orthopedic Surgeon gave her Celebrex She has not started the Celebrex as well  Essential Hypertension She does not have a home bp monitor She denies chest pains, palpitations, she admits to swelling in her ankles She avoids salty foods She is exercising much less   Prediabetes Lab Results  Component Value Date   HGBA1C 5.8 (A) 02/25/2018   She has a family history of diabetes She is not exercising as much She does not eat much bread but loves potatoes  Patient Active Problem List   Diagnosis Date Noted  . Recurrent major depressive disorder, in partial remission ([place]) 04/12/2018  . Arthritis 10/25/2016  . RLS (restless legs syndrome) 03/19/2015  . Lower back pain 07/09/2012  . Hip pain 07/09/2012  . Cervical radiculopathy 05/29/2012  . Anxiety 05/29/2012  . Insomnia 05/29/2012  . BMI 31.0-31.9,adult 05/29/2012  . Depression 12/17/2011    Past Medical History:  Diagnosis Date  . Allergy   . Arthritis     Current Outpatient Medications  Medication  Sig Dispense Refill  . amLODipine (NORVASC) 5 MG tablet Take 1 tablet (5 mg total) by mouth daily. 90 tablet 1  . busPIRone (BUSPAR) 5 MG tablet Take 1 tablet (5 mg total) by mouth 2 (two) times daily as needed. 180 tablet 1  . celecoxib (CELEBREX) 200 MG capsule Take 1 capsule (200 mg total) by mouth 2 (two) times daily as needed. 60 capsule 6  . diclofenac sodium (VOLTAREN) 1 % GEL Apply 2 g topically 4 (four) times daily. 100 g 6  . gabapentin (NEURONTIN) 300 MG capsule Take 1 capsule (300 mg total) by mouth daily. 90 capsule 1  . triamcinolone cream (KENALOG) 0.5 % APPLY TO AFFECTED AREA TWICE A DAY    . venlafaxine XR  (EFFEXOR-XR) 75 MG 24 hr capsule Take 3 capsules (225 mg total) by mouth daily with breakfast. 90 capsule 0   No current facility-administered medications for this visit.     Allergies  Allergen Reactions  . Codeine Nausea And Vomiting  . Meloxicam Hypertension    Social History   Socioeconomic History  . Marital status: Married    Spouse name: Not on file  . Number of children: Not on file  . Years of education: Not on file  . Highest education level: Not on file  Occupational History  . Not on file  Social Needs  . Financial resource strain: Not on file  . Food insecurity:    Worry: Not on file    Inability: Not on file  . Transportation needs:    Medical: Not on file    Non-medical: Not on file  Tobacco Use  . Smoking status: Former Research scientist (life sciences)  . Smokeless tobacco: Never Used  Substance and Sexual Activity  . Alcohol use: No  . Drug use: No  . Sexual activity: Yes    Birth control/protection: None  Lifestyle  . Physical activity:    Days per week: Not on file    Minutes per session: Not on file  . Stress: Not on file  Relationships  . Social connections:    Talks on phone: Not on file    Gets together: Not on file    Attends religious service: Not on file    Active member of club or organization: Not on file    Attends meetings of clubs or organizations: Not on file    Relationship status: Not on file  . Intimate partner violence:    Fear of current or ex partner: Not on file    Emotionally abused: Not on file    Physically abused: Not on file    Forced sexual activity: Not on file  Other Topics Concern  . Not on file  Social History Narrative  . Not on file    ROS  Objective   Vitals as reported by the patient: There were no vitals filed for this visit.  [name] was seen today for anxiety.  Diagnoses and all orders for this visit:  Essential hypertension- pt to get bp checked with labs on 01/13/2019 Discussed DASH diet and exercise -      Lipid panel; Future -     amLODipine (NORVASC) 5 MG tablet; Take 1 tablet (5 mg total) by mouth daily. -     CMP14+EGFR; Future  Recurrent major depressive disorder, in partial remission ([place])- stable and improving, cpm -     busPIRone (BUSPAR) 5 MG tablet; Take 1 tablet (5 mg total) by mouth 2 (two) times daily as needed. -  venlafaxine XR (EFFEXOR-XR) 75 MG 24 hr capsule; Take 3 capsules (225 mg total) by mouth daily with breakfast.  Anxiety and depression- stable and improving -     busPIRone (BUSPAR) 5 MG tablet; Take 1 tablet (5 mg total) by mouth 2 (two) times daily as needed. -     venlafaxine XR (EFFEXOR-XR) 75 MG 24 hr capsule; Take 3 capsules (225 mg total) by mouth daily with breakfast.  Arthritis- will check for other inflammatory markers Continue orthopedic recs -     diclofenac sodium (VOLTAREN) 1 % GEL; Apply 2 g topically 4 (four) times daily. -     CBC; Future -     Sedimentation Rate; Future  Need for vaccination -     Tdap vaccine greater than or equal to 7yo IM; Future  Screening for HIV (human immunodeficiency virus) -     HIV Antibody (routine testing w rflx); Future  Prediabetes-  Will continue screening, advised pt to reduce intake of starchy foods -     Hemoglobin A1c; Future     I discussed the assessment and treatment plan with the patient. The patient was provided an opportunity to ask questions and all were answered. The patient agreed with the plan and demonstrated an understanding of the instructions.   The patient was advised to call back or seek an in-person evaluation if the symptoms worsen or if the condition fails to improve as anticipated.  I provided 25 minutes of non-face-to-face time during this encounter.  Forrest Moron, MD  Primary Care at [facility]

## 2019-01-06 NOTE — Telephone Encounter (Addendum)
01/06/2019 - PATIENT HAD A VIRTUAL VISIT WITH DR. Nolon Rod ON Friday 01/06/2019. DR. Nolon Rod REQUESTED SHE RETURN FOR A 3 MONTH RECHECK ON HER HYPERTENSION (IN July) WITH HER. I HAD TO LEAVE THE PATIENT A VOICE MAIL. [place]

## 2019-01-13 ENCOUNTER — Other Ambulatory Visit: Payer: Self-pay

## 2019-01-13 ENCOUNTER — Ambulatory Visit (INDEPENDENT_AMBULATORY_CARE_PROVIDER_SITE_OTHER): Payer: 59 | Admitting: Family Medicine

## 2019-01-13 DIAGNOSIS — M1990 Unspecified osteoarthritis, unspecified site: Secondary | ICD-10-CM

## 2019-01-13 DIAGNOSIS — I10 Essential (primary) hypertension: Secondary | ICD-10-CM

## 2019-01-13 DIAGNOSIS — Z23 Encounter for immunization: Secondary | ICD-10-CM | POA: Diagnosis not present

## 2019-01-13 DIAGNOSIS — R7303 Prediabetes: Secondary | ICD-10-CM

## 2019-01-13 DIAGNOSIS — Z114 Encounter for screening for human immunodeficiency virus [HIV]: Secondary | ICD-10-CM

## 2019-01-13 NOTE — Patient Instructions (Signed)
Gave pt injection in the left deltoid, she tolerated well.

## 2019-01-14 LAB — CMP14+EGFR
ALT: 11 IU/L (ref 0–32)
AST: 15 IU/L (ref 0–40)
Albumin/Globulin Ratio: 1.9 (ref 1.2–2.2)
Albumin: 4.1 g/dL (ref 3.8–4.9)
Alkaline Phosphatase: 122 IU/L — ABNORMAL HIGH (ref 39–117)
BUN/Creatinine Ratio: 15 (ref 9–23)
BUN: 11 mg/dL (ref 6–24)
Bilirubin Total: 0.2 mg/dL (ref 0.0–1.2)
CO2: 25 mmol/L (ref 20–29)
Calcium: 9.5 mg/dL (ref 8.7–10.2)
Chloride: 105 mmol/L (ref 96–106)
Creatinine, Ser: 0.72 mg/dL (ref 0.57–1.00)
GFR calc Af Amer: 111 mL/min/{1.73_m2} (ref >59)
GFR calc non Af Amer: 97 mL/min/{1.73_m2} (ref >59)
Globulin, Total: 2.2 g/dL (ref 1.5–4.5)
Glucose: 95 mg/dL (ref 65–99)
Potassium: 4.5 mmol/L (ref 3.5–5.2)
Sodium: 143 mmol/L (ref 134–144)
Total Protein: 6.3 g/dL (ref 6.0–8.5)

## 2019-01-14 LAB — SEDIMENTATION RATE: Sed Rate: 30 mm/hr (ref 0–40)

## 2019-01-14 LAB — HEMOGLOBIN A1C
Est. average glucose Bld gHb Est-mCnc: 117 mg/dL
Hgb A1c MFr Bld: 5.7 % — ABNORMAL HIGH (ref 4.8–5.6)

## 2019-01-14 LAB — CBC
Hematocrit: 40.5 % (ref 34.0–46.6)
Hemoglobin: 13.8 g/dL (ref 11.1–15.9)
MCH: 28 pg (ref 26.6–33.0)
MCHC: 34.1 g/dL (ref 31.5–35.7)
MCV: 82 fL (ref 79–97)
Platelets: 326 10*3/uL (ref 150–450)
RBC: 4.92 x10E6/uL (ref 3.77–5.28)
RDW: 13.6 % (ref 11.7–15.4)
WBC: 6.1 10*3/uL (ref 3.4–10.8)

## 2019-01-14 LAB — LIPID PANEL
Chol/HDL Ratio: 3.8 ratio (ref 0.0–4.4)
Cholesterol, Total: 196 mg/dL (ref 100–199)
HDL: 52 mg/dL (ref >39)
LDL Calculated: 115 mg/dL — ABNORMAL HIGH (ref 0–99)
Triglycerides: 145 mg/dL (ref 0–149)
VLDL Cholesterol Cal: 29 mg/dL (ref 5–40)

## 2019-01-14 LAB — HIV ANTIBODY (ROUTINE TESTING W REFLEX): HIV Screen 4th Generation wRfx: NONREACTIVE

## 2019-01-23 ENCOUNTER — Telehealth: Payer: Self-pay | Admitting: Family Medicine

## 2019-01-23 NOTE — Telephone Encounter (Signed)
Copied from [place] [id number]. Topic: Quick Communication - See Telephone Encounter >> Jan 23, 2019  9:57 AM Rutherford Nail, NT wrote: CRM for notification. See Telephone encounter for: 01/23/19. Patient calling and states that she would like to know if Dr Nolon Rod could prescribe meloxicam for her elbow pain? States that she has been prescribed this medication before and it worked very well. States that the celecoxib (CELEBREX) 200 MG capsule is not working. Please advise.  CB#: [phone] [id number] - [place], [place] - [address]

## 2019-01-25 NOTE — Telephone Encounter (Signed)
Please advise if you are willing to prescribe meloxicam for elbow pain

## 2019-01-26 NOTE — Telephone Encounter (Signed)
Pt is calling back for a update. She would like to have the nurse call her today if possible. Thanks

## 2019-02-06 ENCOUNTER — Ambulatory Visit: Payer: 59 | Admitting: Family Medicine

## 2019-02-06 ENCOUNTER — Other Ambulatory Visit: Payer: Self-pay

## 2019-02-06 ENCOUNTER — Encounter: Payer: Self-pay | Admitting: Family Medicine

## 2019-02-06 VITALS — BP 132/81 | HR 97 | Temp 98.7°F | Resp 17 | Ht 63.0 in | Wt 168.6 lb

## 2019-02-06 DIAGNOSIS — F329 Major depressive disorder, single episode, unspecified: Secondary | ICD-10-CM

## 2019-02-06 DIAGNOSIS — M25522 Pain in left elbow: Secondary | ICD-10-CM | POA: Diagnosis not present

## 2019-02-06 DIAGNOSIS — M4722 Other spondylosis with radiculopathy, cervical region: Secondary | ICD-10-CM

## 2019-02-06 DIAGNOSIS — F32A Depression, unspecified: Secondary | ICD-10-CM

## 2019-02-06 DIAGNOSIS — M25521 Pain in right elbow: Secondary | ICD-10-CM | POA: Diagnosis not present

## 2019-02-06 DIAGNOSIS — F419 Anxiety disorder, unspecified: Secondary | ICD-10-CM

## 2019-02-06 DIAGNOSIS — M1990 Unspecified osteoarthritis, unspecified site: Secondary | ICD-10-CM

## 2019-02-06 DIAGNOSIS — G8929 Other chronic pain: Secondary | ICD-10-CM

## 2019-02-06 MED ORDER — MELOXICAM 7.5 MG PO TABS: 7.5000 mg | ORAL_TABLET | Freq: Every day | ORAL | 1 refills | Status: DC

## 2019-02-06 NOTE — Progress Notes (Signed)
Established Patient Office Visit  Subjective:  Patient ID: Rachel Roth, female    DOB: November 24, 1965  Age: 53 y.o. MRN: [id number]  CC:  Chief Complaint  Patient presents with  . discuss meloxicam    HPI AAYAT Roth presents for   Patient is here today to discuss pain relief for her arthritis She states that she has been to [facility] [employer] [facility] and was given Celebrex which is not helping her She states that her right elbow is about 8/10 and the celebrex and voltaren do not help She took meloxicam in the past which was discontinued due to hypertension She reports that she would like to try it again because she is changing her diet to maintain a good blood pressure  Reviewed of notes from [place] Right lateral epicondylitis Left medial epicondylitis  She also complains of left plantar fasciitis and right side upper extremity with tingling from the neck down to the elbow and sometimes down to the wrist She states that her elbow does not get edema  Past Medical History:  Diagnosis Date  . Allergy   . Arthritis     Past Surgical History:  Procedure Laterality Date  . ABDOMINAL HYSTERECTOMY    . CESAREAN SECTION      Family History  Problem Relation Age of Onset  . Diabetes Mother   . Cancer Father 42       Lung cancer  . Diabetes Sister   . Diabetes Brother   . Diabetes Sister   . Cancer Sister 86       breast cancer  . Cancer Maternal Aunt 50       breast cancer  . Cancer Maternal Grandmother        breast cancer    Social History   Socioeconomic History  . Marital status: Married    Spouse name: Not on file  . Number of children: Not on file  . Years of education: Not on file  . Highest education level: Not on file  Occupational History  . Not on file  Social Needs  . Financial resource strain: Not on file  . Food insecurity:    Worry: Not on file    Inability: Not on file  . Transportation needs:    Medical: Not on file     Non-medical: Not on file  Tobacco Use  . Smoking status: Former Research scientist (life sciences)  . Smokeless tobacco: Never Used  Substance and Sexual Activity  . Alcohol use: No  . Drug use: No  . Sexual activity: Yes    Birth control/protection: None  Lifestyle  . Physical activity:    Days per week: Not on file    Minutes per session: Not on file  . Stress: Not on file  Relationships  . Social connections:    Talks on phone: Not on file    Gets together: Not on file    Attends religious service: Not on file    Active member of club or organization: Not on file    Attends meetings of clubs or organizations: Not on file    Relationship status: Not on file  . Intimate partner violence:    Fear of current or ex partner: Not on file    Emotionally abused: Not on file    Physically abused: Not on file    Forced sexual activity: Not on file  Other Topics Concern  . Not on file  Social History Narrative  . Not on file  Outpatient Medications Prior to Visit  Medication Sig Dispense Refill  . amLODipine (NORVASC) 5 MG tablet Take 1 tablet (5 mg total) by mouth daily. 90 tablet 1  . APPLE CIDER VINEGAR PO Take by mouth.    . diclofenac sodium (VOLTAREN) 1 % GEL Apply 2 g topically 4 (four) times daily. 100 g 6  . gabapentin (NEURONTIN) 300 MG capsule Take 1 capsule (300 mg total) by mouth daily. 90 capsule 1  . triamcinolone cream (KENALOG) 0.5 % APPLY TO AFFECTED AREA TWICE A DAY    . venlafaxine XR (EFFEXOR-XR) 75 MG 24 hr capsule Take 3 capsules (225 mg total) by mouth daily with breakfast. 90 capsule 0  . busPIRone (BUSPAR) 5 MG tablet Take 1 tablet (5 mg total) by mouth 2 (two) times daily as needed. (Patient not taking: Reported on 02/06/2019) 180 tablet 1  . celecoxib (CELEBREX) 200 MG capsule Take 1 capsule (200 mg total) by mouth 2 (two) times daily as needed. (Patient not taking: Reported on 02/06/2019) 60 capsule 6   No facility-administered medications prior to visit.     Allergies   Allergen Reactions  . Codeine Nausea And Vomiting  . Meloxicam Hypertension    ROS Review of Systems    Objective:    Physical Exam  BP 132/81 (BP Location: Right Arm, Patient Position: Sitting, Cuff Size: Normal)   Pulse 97   Temp 98.7 F (37.1 C) (Oral)   Resp 17   Ht 5\' 3"  (1.6 m)   Wt 168 lb 9.6 oz (76.5 kg)   SpO2 96%   BMI 29.87 kg/m  Wt Readings from Last 3 Encounters:  02/06/19 168 lb 9.6 oz (76.5 kg)  11/08/18 169 lb 9.6 oz (76.9 kg)  10/05/18 168 lb 12.8 oz (76.6 kg)   Physical Exam  Constitutional: Oriented to person, place, and time. Appears well-developed and well-nourished.  HENT:  Head: Normocephalic and atraumatic.  Eyes: Conjunctivae and EOM are normal.  Cardiovascular: Normal rate, regular rhythm, normal heart sounds and intact distal pulses.  No murmur heard. Pulmonary/Chest: Effort normal and breath sounds normal. No stridor. No respiratory distress. Has no wheezes.  Neurological: Is alert and oriented to person, place, and time.  Skin: Skin is warm. Capillary refill takes less than 2 seconds.  Psychiatric: Has a normal mood and affect. Behavior is normal. Judgment and thought content normal.   Musculoskeletal:  With pronation and supination patient has tenderness  CLINICAL DATA:  Neck pain and stiffness  EXAM: CERVICAL SPINE - COMPLETE 4+ VIEW  COMPARISON:  None.  FINDINGS: No evidence of fracture, erosion, bone lesion, or prevertebral swelling. Mild-to-moderate disc narrowing and endplate ridging at K1-6 and C5-6. No definite facet disease. Spinal alignment is normal.  IMPRESSION: 1. No acute finding. 2. C4-5 and C5-6 disc degeneration.   Electronically Signed   By: Monte Fantasia M.D.   On: 02/25/2018 11:09   Health Maintenance Due  Topic Date Due  . MAMMOGRAM  11/26/2017    There are no preventive care reminders to display for this patient.  Lab Results  Component Value Date   TSH 1.580 03/19/2015   Lab  Results  Component Value Date   WBC 6.1 01/13/2019   HGB 13.8 01/13/2019   HCT 40.5 01/13/2019   MCV 82 01/13/2019   PLT 326 01/13/2019   Lab Results  Component Value Date   NA 143 01/13/2019   K 4.5 01/13/2019   CO2 25 01/13/2019   GLUCOSE 95 01/13/2019  BUN 11 01/13/2019   CREATININE 0.72 01/13/2019   BILITOT 0.2 01/13/2019   ALKPHOS 122 (H) 01/13/2019   AST 15 01/13/2019   ALT 11 01/13/2019   PROT 6.3 01/13/2019   ALBUMIN 4.1 01/13/2019   CALCIUM 9.5 01/13/2019   Lab Results  Component Value Date   CHOL 196 01/13/2019   Lab Results  Component Value Date   HDL 52 01/13/2019   Lab Results  Component Value Date   LDLCALC 115 (H) 01/13/2019   Lab Results  Component Value Date   TRIG 145 01/13/2019   Lab Results  Component Value Date   CHOLHDL 3.8 01/13/2019   Lab Results  Component Value Date   HGBA1C 5.7 (H) 01/13/2019      Assessment & Plan:   Problem List Items Addressed This Visit      Musculoskeletal and Integument   Arthritis - Primary   Relevant Medications   meloxicam (MOBIC) 7.5 MG tablet     Advised meloxicam and will track renal function adviised follow up with Orthopedics Reviewed orthopedics notes with patient   A total of 25 minutes were spent face-to-face with the patient during this encounter and over half of that time was spent on counseling and coordination of care.   Meds ordered this encounter  Medications  . meloxicam (MOBIC) 7.5 MG tablet    Sig: Take 1 tablet (7.5 mg total) by mouth daily.    Dispense:  30 tablet    Refill:  1    Follow-up: No follow-ups on file.    Forrest Moron, MD

## 2019-02-06 NOTE — Telephone Encounter (Signed)
Patient came in for office visit.

## 2019-02-06 NOTE — Patient Instructions (Signed)
How to Take Your Blood Pressure    Blood pressure is a measurement of how strongly your blood is pressing against the walls of your arteries. Arteries are blood vessels that carry blood from your heart throughout your body. Your health care provider takes your blood pressure at each office visit. You can also take your own blood pressure at home with a blood pressure machine. You may need to take your own blood pressure:   To confirm a diagnosis of high blood pressure (hypertension).   To monitor your blood pressure over time.   To make sure your blood pressure medicine is working.    Supplies needed:    To take your blood pressure, you will need a blood pressure machine. You can buy a blood pressure machine, or blood pressure monitor, at most drugstores or online. There are several types of home blood pressure monitors. When choosing one, consider the following:   Choose a monitor that has an arm cuff.   Choose a cuff that wraps snugly around your upper arm. You should be able to fit only one finger between your arm and the cuff.   Do not choose a monitor that measures your blood pressure from your wrist or finger.  Your health care provider can suggest a reliable monitor that will meet your needs.    How to prepare  To get the most accurate reading, avoid the following for 30 minutes before you check your blood pressure:   Drinking caffeine.   Drinking alcohol.   Eating.   Smoking.   Exercising.  Five minutes before you check your blood pressure:   Empty your bladder.   Sit quietly without talking in a dining chair, rather than in a soft couch or armchair.    How to take your blood pressure  To check your blood pressure, follow the instructions in the manual that came with your blood pressure monitor. If you have a digital blood pressure monitor, the instructions may be as follows:  1. Sit up straight.  2. Place your feet on the floor. Do not cross your ankles or legs.  3. Rest your left arm at the  level of your heart on a table or desk or on the arm of a chair.  4. Pull up your shirt sleeve.  5. Wrap the blood pressure cuff around the upper part of your left arm, 1 inch (2.5 cm) above your elbow. It is best to wrap the cuff around bare skin.  6. Fit the cuff snugly around your arm. You should be able to place only one finger between the cuff and your arm.  7. Position the cord inside the groove of your elbow.  8. Press the power button.  9. Sit quietly while the cuff inflates and deflates.  10. Read the digital reading on the monitor screen and write it down (record it).  11. Wait 2-3 minutes, then repeat the steps, starting at step 1.    What does my blood pressure reading mean?  A blood pressure reading consists of a higher number over a lower number. Ideally, your blood pressure should be below 120/80. The first ("top") number is called the systolic pressure. It is a measure of the pressure in your arteries as your heart beats. The second ("bottom") number is called the diastolic pressure. It is a measure of the pressure in your arteries as the heart relaxes.  Blood pressure is classified into four stages. The following are the stages for adults   who do not have a short-term serious illness or a chronic condition. Systolic pressure and diastolic pressure are measured in a unit called mm Hg.    Normal   Systolic pressure: below 120.   Diastolic pressure: below 80.  Elevated   Systolic pressure: 120-129.   Diastolic pressure: below 80.  Hypertension stage 1   Systolic pressure: 130-139.   Diastolic pressure: 80-89.  Hypertension stage 2   Systolic pressure: 140 or above.   Diastolic pressure: 90 or above.  You can have prehypertension or hypertension even if only the systolic or only the diastolic number in your reading is higher than normal.    Follow these instructions at home:   Check your blood pressure as often as recommended by your health care provider.   Take your monitor to the next  appointment with your health care provider to make sure:  ? That you are using it correctly.  ? That it provides accurate readings.   Be sure you understand what your goal blood pressure numbers are.   Tell your health care provider if you are having any side effects from blood pressure medicine.    Contact a health care provider if:   Your blood pressure is consistently high.    Get help right away if:   Your systolic blood pressure is higher than 180.   Your diastolic blood pressure is higher than 110.    This information is not intended to replace advice given to you by your health care provider. Make sure you discuss any questions you have with your health care provider.    Document Released: 02/21/2016 Document Revised: 07/27/2017 Document Reviewed: 02/21/2016  Elsevier Interactive Patient Education  2019 [employer]

## 2019-02-16 ENCOUNTER — Other Ambulatory Visit: Payer: Self-pay | Admitting: Family Medicine

## 2019-02-16 DIAGNOSIS — F32A Depression, unspecified: Secondary | ICD-10-CM

## 2019-02-16 DIAGNOSIS — F329 Major depressive disorder, single episode, unspecified: Secondary | ICD-10-CM

## 2019-02-16 DIAGNOSIS — F419 Anxiety disorder, unspecified: Secondary | ICD-10-CM

## 2019-02-16 DIAGNOSIS — F3341 Major depressive disorder, recurrent, in partial remission: Secondary | ICD-10-CM

## 2019-02-16 NOTE — Telephone Encounter (Signed)
Requested medication (s) are due for refill today: Yes  Requested medication (s) are on the active medication list: Yes  Last refill:  01/06/19  Future visit scheduled: Yes  Notes to clinic:  Need DX code    Requested Prescriptions  Pending Prescriptions Disp Refills   venlafaxine XR (EFFEXOR-XR) 75 MG 24 hr capsule [Pharmacy Med Name: VENLAFAXINE HCL ER 75 MG CAP] 270 capsule 1    Sig: Take 3 capsules (225 mg total) by mouth daily with breakfast.     Psychiatry: Antidepressants - SNRI - desvenlafaxine & venlafaxine Failed - 02/16/2019 12:36 PM      Failed - LDL in normal range and within 360 days    LDL Calculated  Date Value Ref Range Status  01/13/2019 115 (H) 0 - 99 mg/dL Final         Passed - Total Cholesterol in normal range and within 360 days    Cholesterol, Total  Date Value Ref Range Status  01/13/2019 196 100 - 199 mg/dL Final         Passed - Triglycerides in normal range and within 360 days    Triglycerides  Date Value Ref Range Status  01/13/2019 145 0 - 149 mg/dL Final         Passed - Completed PHQ-2 or PHQ-9 in the last 360 days.      Passed - Last BP in normal range    BP Readings from Last 1 Encounters:  02/06/19 132/81         Passed - Valid encounter within last 6 months    Recent Outpatient Visits          1 week ago Arthritis   Primary Care at [facility], Arlie Solomons, MD   1 month ago Arthritis   Primary Care at [facility], Arlie Solomons, MD   1 month ago Essential hypertension   Primary Care at [facility], Arlie Solomons, MD   3 months ago Elbow pain, chronic, right   Primary Care at [facility], Ranell Patrick, MD   4 months ago Essential hypertension   Primary Care at [facility], Arlie Solomons, MD      Future Appointments            In 2 weeks Forrest Moron, MD Primary Care at [place], [facility]

## 2019-03-06 ENCOUNTER — Other Ambulatory Visit: Payer: Self-pay

## 2019-03-06 ENCOUNTER — Encounter: Payer: Self-pay | Admitting: Family Medicine

## 2019-03-06 ENCOUNTER — Telehealth: Payer: Self-pay | Admitting: Family Medicine

## 2019-03-06 ENCOUNTER — Ambulatory Visit: Payer: 59 | Admitting: Family Medicine

## 2019-03-06 VITALS — BP 131/82 | HR 75 | Temp 98.7°F | Resp 17 | Ht 63.0 in | Wt 166.4 lb

## 2019-03-06 DIAGNOSIS — N6310 Unspecified lump in the right breast, unspecified quadrant: Secondary | ICD-10-CM | POA: Diagnosis not present

## 2019-03-06 DIAGNOSIS — N644 Mastodynia: Secondary | ICD-10-CM

## 2019-03-06 DIAGNOSIS — R234 Changes in skin texture: Secondary | ICD-10-CM | POA: Diagnosis not present

## 2019-03-06 DIAGNOSIS — N6315 Unspecified lump in the right breast, overlapping quadrants: Secondary | ICD-10-CM

## 2019-03-06 DIAGNOSIS — Z803 Family history of malignant neoplasm of breast: Secondary | ICD-10-CM

## 2019-03-06 LAB — HM MAMMOGRAPHY

## 2019-03-06 MED ORDER — CEPHALEXIN 500 MG PO CAPS: 500.0000 mg | ORAL_CAPSULE | Freq: Four times a day (QID) | ORAL | 0 refills | Status: AC

## 2019-03-06 NOTE — Telephone Encounter (Signed)
Would you like for me to send in an abx for this pt?

## 2019-03-06 NOTE — Telephone Encounter (Signed)
Please notify the patient that antibiotic keflex 500mg  four times a day has been sent in.

## 2019-03-06 NOTE — Patient Instructions (Addendum)
If you have lab work done today you will be contacted with your lab results within the next 2 weeks.  If you have not heard from [place] then please contact us. The fastest way to get your results is to register for My Chart.   IF you received an x-ray today, you will receive an invoice from [facility]. Please contact [facility] at [phone] with questions or concerns regarding your invoice.   IF you received labwork today, you will receive an invoice from [place]. Please contact [facility] at [phone] with questions or concerns regarding your invoice.   Our billing staff will not be able to assist you with questions regarding bills from these companies.  You will be contacted with the lab results as soon as they are available. The fastest way to get your results is to activate your My Chart account. Instructions are located on the last page of this paperwork. If you have not heard from [place] regarding the results in 2 weeks, please contact this office.     Breast Cyst  A breast cyst is a sac in the breast that is filled with fluid. Breast cysts are usually noncancerous (benign). They are common among women, and they are most often located in the upper, outer portion of the breast. One or more cysts may develop. They form when fluid builds up inside of the breast glands. There are several types of breast cysts:  Macrocyst. This is a cyst that is about 2 inches (5.1 cm) across (in diameter).  Microcyst. This is a very small cyst that you cannot feel, but it can be seen with imaging tests such as an X-ray of the breast (mammogram) or ultrasound.  Galactocele. This is a cyst that contains milk. It may develop if you suddenly stop breastfeeding. Breast cysts do not increase your risk of breast cancer. They usually disappear after menopause, unless you take artificial hormones (are on hormone therapy). What are the causes? The exact cause of breast cysts is not known.  Possible causes include:  Blockage of tubes (ducts) in the breast glands, which leads to fluid buildup. Duct blockage may result from: ? Fibrocystic breast changes. This is a common, benign condition that occurs when women go through hormonal changes during the menstrual cycle. This is a common cause of multiple breast cysts. ? Overgrowth of breast tissue or breast glands. ? Scar tissue in the breast from previous surgery.  Changes in certain female hormones (estrogen and progesterone). What increases the risk? You may be more likely to develop breast cysts if you have not gone through menopause. What are the signs or symptoms? Symptoms of a breast cyst may include:  Feeling one or more smooth, round, soft lumps (like grapes) in the breast that are easily moveable. The lump(s) may get bigger and more painful before your period and get smaller after your period.  Breast discomfort or pain. How is this diagnosed? A cyst can be felt during a physical exam by your health care provider. A mammogram and ultrasound will be done to confirm the diagnosis. Fluid may be removed from the cyst with a needle (fine-needle aspiration) and tested to make sure the cyst is not cancerous. How is this treated? Treatment may not be necessary. Your health care provider may monitor the cyst to see if it goes away on its own. If the cyst is uncomfortable or gets bigger, or if you do not like how the cyst makes your breast look, you  may need treatment. Treatment may include:  Hormone treatment.  Fine-needle aspiration, to drain fluid from the cyst. There is a chance of the cyst coming back (recurring) after aspiration.  Surgery to remove the cyst. Follow these instructions at home:  See your health care provider regularly. ? Get a yearly physical exam. ? If you are 64-24 years old, get a clinical breast exam every 1-3 years. After age 35, get this exam every year. ? Get mammograms as often as directed.  Do a  breast self-exam every month, or as often as directed. Having many breast cysts, or "lumpy" breasts, may make it harder to feel for new lumps. Understand how your breasts normally look and feel, and write down any changes in your breasts so you can tell your health care provider about the changes. A breast self-exam involves: ? Comparing your breasts in the mirror. ? Looking for visible changes in your skin or nipples. ? Feeling for lumps or changes.  Take over-the-counter and prescription medicines only as told by your health care provider.  Wear a supportive bra, especially when exercising.  Follow instructions from your health care provider about eating and drinking restrictions. ? Avoid caffeine. ? Cut down on salt (sodium) in what you eat and drink, especially before your menstrual period. Too much sodium can cause fluid buildup (retention), breast swelling, and discomfort.  Keep all follow-up visits as told your health care provider. This is important. Contact a health care provider if:  You feel, or think you feel, a lump in your breast.  You notice that both breasts look or feel different than usual.  Your breast is still causing pain after your menstrual period is over.  You find new lumps or bumps that were not there before.  You feel lumps in your armpit (axilla). Get help right away if:  You have severe pain, tenderness, redness, or warmth in your breast.  You have fluid or blood leaking from your nipple.  Your breast lump becomes hard and painful.  You notice dimpling or wrinkling of the breast or nipple. This information is not intended to replace advice given to you by your health care provider. Make sure you discuss any questions you have with your health care provider. Document Released: 09/14/2005 Document Revised: 06/05/2016 Document Reviewed: 06/05/2016 Elsevier Interactive Patient Education  2019 [employer].

## 2019-03-06 NOTE — Telephone Encounter (Signed)
Copied from [place] [id number]. Topic: Quick Communication - Rx Refill/Question >> Mar 06, 2019 12:22 PM [name] wrote: Medication: an [name] with [facility] mammography calling for follow up on stat mammogram done today ordered by Dr Nolon Rod. The radiologist is asking dr Bridget Hartshorn to put the pt on an abx for possible right side breast abscess.  She states pt is aware and has left their office [name] direct line [phone]

## 2019-03-07 NOTE — Telephone Encounter (Signed)
Notified pt via phone keflex 500 mg one tab 4 times daily sent to her pharmacy.  Pt verbalized understanding an agreeable.  Dgaddy, CMA

## 2019-03-09 NOTE — Progress Notes (Signed)
Established Patient Office Visit  Subjective:  Patient ID: Rachel Roth, female    DOB: [birth year]  Age: 53 y.o. MRN: [id number]  CC:  Chief Complaint  Patient presents with  . one month f/u on nsaid use    x yesterday morning notice ? knot with redness under right breast    HPI Rachel Roth presents for   Right breast redness  Patient noticed a knot beneath her right breast 2 days ago States that this morning it turned red No nipple discharge No fingers or chills No insect bite etc   Past Medical History:  Diagnosis Date  . Allergy   . Arthritis     Past Surgical History:  Procedure Laterality Date  . ABDOMINAL HYSTERECTOMY    . CESAREAN SECTION      Family History  Problem Relation Age of Onset  . Diabetes Mother   . Cancer Father 53       Lung cancer  . Diabetes Sister   . Diabetes Brother   . Diabetes Sister   . Cancer Sister 8       breast cancer  . Cancer Maternal Aunt 50       breast cancer  . Cancer Maternal Grandmother        breast cancer    Social History   Socioeconomic History  . Marital status: Married    Spouse name: Not on file  . Number of children: Not on file  . Years of education: Not on file  . Highest education level: Not on file  Occupational History  . Not on file  Social Needs  . Financial resource strain: Not on file  . Food insecurity    Worry: Not on file    Inability: Not on file  . Transportation needs    Medical: Not on file    Non-medical: Not on file  Tobacco Use  . Smoking status: Former Research scientist (life sciences)  . Smokeless tobacco: Never Used  Substance and Sexual Activity  . Alcohol use: No  . Drug use: No  . Sexual activity: Yes    Birth control/protection: None  Lifestyle  . Physical activity    Days per week: Not on file    Minutes per session: Not on file  . Stress: Not on file  Relationships  . Social Herbalist on phone: Not on file    Gets together: Not on file    Attends  religious service: Not on file    Active member of club or organization: Not on file    Attends meetings of clubs or organizations: Not on file    Relationship status: Not on file  . Intimate partner violence    Fear of current or ex partner: Not on file    Emotionally abused: Not on file    Physically abused: Not on file    Forced sexual activity: Not on file  Other Topics Concern  . Not on file  Social History Narrative  . Not on file    Outpatient Medications Prior to Visit  Medication Sig Dispense Refill  . amLODipine (NORVASC) 5 MG tablet Take 1 tablet (5 mg total) by mouth daily. 90 tablet 1  . APPLE CIDER VINEGAR PO Take by mouth.    . busPIRone (BUSPAR) 5 MG tablet Take 1 tablet (5 mg total) by mouth 2 (two) times daily as needed. 180 tablet 1  . diclofenac sodium (VOLTAREN) 1 % GEL Apply 2 g topically 4 (  four) times daily. 100 g 6  . gabapentin (NEURONTIN) 300 MG capsule Take 1 capsule (300 mg total) by mouth daily. 90 capsule 1  . meloxicam (MOBIC) 7.5 MG tablet Take 1 tablet (7.5 mg total) by mouth daily. 30 tablet 1  . triamcinolone cream (KENALOG) 0.5 % APPLY TO AFFECTED AREA TWICE A DAY    . venlafaxine XR (EFFEXOR-XR) 75 MG 24 hr capsule TAKE 3 CAPSULES (225 MG TOTAL) BY MOUTH DAILY WITH BREAKFAST. 270 capsule 1   No facility-administered medications prior to visit.     Allergies  Allergen Reactions  . Codeine Nausea And Vomiting  . Meloxicam Hypertension    ROS Review of Systems Review of Systems  Constitutional: Negative for activity change, appetite change, chills and fever.  HENT: Negative for congestion, nosebleeds, trouble swallowing and voice change.   Respiratory: Negative for cough, shortness of breath and wheezing.   Gastrointestinal: Negative for diarrhea, nausea and vomiting.  Genitourinary: Negative for difficulty urinating, dysuria, flank pain and hematuria.  Musculoskeletal: Negative for back pain, joint swelling and neck pain.  Neurological:  Negative for dizziness, speech difficulty, light-headedness and numbness.  See HPI. All other review of systems negative.     Objective:    Physical Exam  BP 131/82 (BP Location: Right Arm, Patient Position: Sitting, Cuff Size: Normal)   Pulse 75   Temp 98.7 F (37.1 C) (Oral)   Resp 17   Ht 5\' 3"  (1.6 m)   Wt 166 lb 6.4 oz (75.5 kg)   SpO2 95%   BMI 29.48 kg/m  Wt Readings from Last 3 Encounters:  03/06/19 166 lb 6.4 oz (75.5 kg)  02/06/19 168 lb 9.6 oz (76.5 kg)  11/08/18 169 lb 9.6 oz (76.9 kg)   Physical Exam  Constitutional: Oriented to person, place, and time. Appears well-developed and well-nourished.  HENT:  Head: Normocephalic and atraumatic.  Eyes: Conjunctivae and EOM are normal.  Cardiovascular: Normal rate, regular rhythm, normal heart sounds and intact distal pulses.  No murmur heard. Pulmonary/Chest: Effort normal and breath sounds normal. No stridor. No respiratory distress. Has no wheezes.  Neurological: Is alert and oriented to person, place, and time.  Skin: Skin is warm. Capillary refill takes less than 2 seconds.  Psychiatric: Has a normal mood and affect. Behavior is normal. Judgment and thought content normal.   Right Breast with redness and palpable mass at 6 o'clockl No nipple discharge No LAD No peau du orange  Left breast normal without skin changes or abnormal lesion  Health Maintenance Due  Topic Date Due  . MAMMOGRAM  11/26/2017    There are no preventive care reminders to display for this patient.  Lab Results  Component Value Date   TSH 1.580 03/19/2015   Lab Results  Component Value Date   WBC 6.1 01/13/2019   HGB 13.8 01/13/2019   HCT 40.5 01/13/2019   MCV 82 01/13/2019   PLT 326 01/13/2019   Lab Results  Component Value Date   NA 143 01/13/2019   K 4.5 01/13/2019   CO2 25 01/13/2019   GLUCOSE 95 01/13/2019   BUN 11 01/13/2019   CREATININE 0.72 01/13/2019   BILITOT 0.2 01/13/2019   ALKPHOS 122 (H) 01/13/2019    AST 15 01/13/2019   ALT 11 01/13/2019   PROT 6.3 01/13/2019   ALBUMIN 4.1 01/13/2019   CALCIUM 9.5 01/13/2019   Lab Results  Component Value Date   CHOL 196 01/13/2019   Lab Results  Component Value Date  HDL 52 01/13/2019   Lab Results  Component Value Date   LDLCALC 115 (H) 01/13/2019   Lab Results  Component Value Date   TRIG 145 01/13/2019   Lab Results  Component Value Date   CHOLHDL 3.8 01/13/2019   Lab Results  Component Value Date   HGBA1C 5.7 (H) 01/13/2019      Assessment & Plan:   Problem List Items Addressed This Visit    None    Visit Diagnoses    Breast pain, right    -  Primary   Relevant Orders   MM Digital Diagnostic Bilat   Breast lump on right side at 6 o'clock position       Relevant Orders   MM Digital Diagnostic Bilat   Breast skin changes       Relevant Orders   MM Digital Diagnostic Bilat   Family history of breast cancer       Relevant Orders   MM Digital Diagnostic Bilat       Concerning for abscess vs. Cyst Will send for stat ultrasound Will send in antibiotic after reviewing mammogram    No orders of the defined types were placed in this encounter.   Follow-up: No follow-ups on file.    Forrest Moron, MD

## 2019-03-13 ENCOUNTER — Encounter: Payer: Self-pay | Admitting: *Deleted

## 2019-03-13 ENCOUNTER — Encounter: Payer: Self-pay | Admitting: Family Medicine

## 2019-03-27 ENCOUNTER — Other Ambulatory Visit: Payer: Self-pay | Admitting: Family Medicine

## 2019-03-30 ENCOUNTER — Other Ambulatory Visit: Payer: Self-pay | Admitting: Family Medicine

## 2019-03-30 DIAGNOSIS — M1990 Unspecified osteoarthritis, unspecified site: Secondary | ICD-10-CM

## 2019-04-20 ENCOUNTER — Ambulatory Visit (INDEPENDENT_AMBULATORY_CARE_PROVIDER_SITE_OTHER): Payer: 59 | Admitting: Podiatry

## 2019-04-20 ENCOUNTER — Other Ambulatory Visit: Payer: Self-pay

## 2019-04-20 ENCOUNTER — Encounter: Payer: Self-pay | Admitting: Podiatry

## 2019-04-20 ENCOUNTER — Ambulatory Visit (INDEPENDENT_AMBULATORY_CARE_PROVIDER_SITE_OTHER): Payer: 59

## 2019-04-20 VITALS — BP 140/88 | HR 89 | Temp 98.0°F

## 2019-04-20 DIAGNOSIS — M722 Plantar fascial fibromatosis: Secondary | ICD-10-CM

## 2019-04-20 MED ORDER — DICLOFENAC SODIUM 75 MG PO TBEC: 75.0000 mg | DELAYED_RELEASE_TABLET | Freq: Two times a day (BID) | ORAL | 2 refills | Status: DC

## 2019-04-20 NOTE — Patient Instructions (Signed)

## 2019-04-21 NOTE — Progress Notes (Signed)
Subjective:   Patient ID: Rachel Roth, female   DOB: 53 y.o.   MRN: [id number]   HPI Patient presents stating she has a lot of pain to the bottom of her left heel and states it is been hurting for several months.  Also has noted some numbness in her left great toe but that is not bothering her as far as pain goes.  Patient does not smoke likes to be active   Review of Systems  All other systems reviewed and are negative.       Objective:  Physical Exam Vitals signs and nursing note reviewed.  Constitutional:      Appearance: She is well-developed.  Pulmonary:     Effort: Pulmonary effort is normal.  Musculoskeletal: Normal range of motion.  Skin:    General: Skin is warm.  Neurological:     Mental Status: She is alert.     Neurovascular status intact muscle strength found to be adequate range of motion within normal limits.  Patient is noted to have exquisite discomfort plantar aspect left heel at the insertional point of the tendon into the calcaneus with inflammation fluid around the medial band.  Patient is noted to have good digital perfusion well oriented x3 with no indications of pathology associated with it no big toe     Assessment:  Acute plantar fasciitis left with inflammation fluid buildup along with possibility for localized or systemic nerve entrapment that does not appear to be pathological currently     Plan:  H&P reviewed conditions were get a focus on the heel.  Today I went ahead did sterile prep injected the left fascia 3 mg Kenalog 5 mg Xylocaine at the insertion and applied fascial brace to lift the arch.  Gave instructions on physical therapy shoe gear modifications and prescribed diclofenac and told her to watch her blood pressure and if it should elevate to stop taking.  X-rays indicate there is spur with no indications of stress fracture or arthritis

## 2019-05-04 ENCOUNTER — Other Ambulatory Visit: Payer: Self-pay

## 2019-05-04 ENCOUNTER — Encounter: Payer: Self-pay | Admitting: Podiatry

## 2019-05-04 ENCOUNTER — Ambulatory Visit (INDEPENDENT_AMBULATORY_CARE_PROVIDER_SITE_OTHER): Payer: 59 | Admitting: Podiatry

## 2019-05-04 VITALS — Temp 98.2°F

## 2019-05-04 DIAGNOSIS — M722 Plantar fascial fibromatosis: Secondary | ICD-10-CM | POA: Diagnosis not present

## 2019-05-04 NOTE — Progress Notes (Signed)
Subjective:   Patient ID: Rachel Roth, female   DOB: 53 y.o.   MRN: [id number]   HPI Patient states she still has 1 spot of pain in her left heel but it is a quite a bit improved and she still gets some numbness around her big toe   ROS      Objective:  Physical Exam  Neuro vascular status intact negative Homans sign noted with patient's left heel still being very tender in the medial band at the insertion with numbness of the big toe that is present     Assessment:  Plantar fasciitis still present left with mild numbness left still present     Plan:  H&P conditions reviewed and today I did do a sterile prep and injected the fascia at insertion 3 mg Kenalog 5 mg Xylocaine discussed the numbness and will just put on a watch and see what it does.  Reappoint as symptoms indicate may require orthotics

## 2019-05-24 ENCOUNTER — Other Ambulatory Visit: Payer: Self-pay | Admitting: Family Medicine

## 2019-05-24 DIAGNOSIS — M1990 Unspecified osteoarthritis, unspecified site: Secondary | ICD-10-CM

## 2019-06-18 ENCOUNTER — Other Ambulatory Visit: Payer: Self-pay | Admitting: Family Medicine

## 2019-06-19 IMAGING — DX DG ELBOW COMPLETE 3+V*L*
4 series · 4 of 4 positions shown · non-contrast
Comparison: None.

CLINICAL DATA: Chronic six-month history of LEFT elbow pain
localizing to the MEDIAL epicondyle. No known injuries.

EXAM:
LEFT ELBOW - COMPLETE 3+ VIEW

[elbow ap]
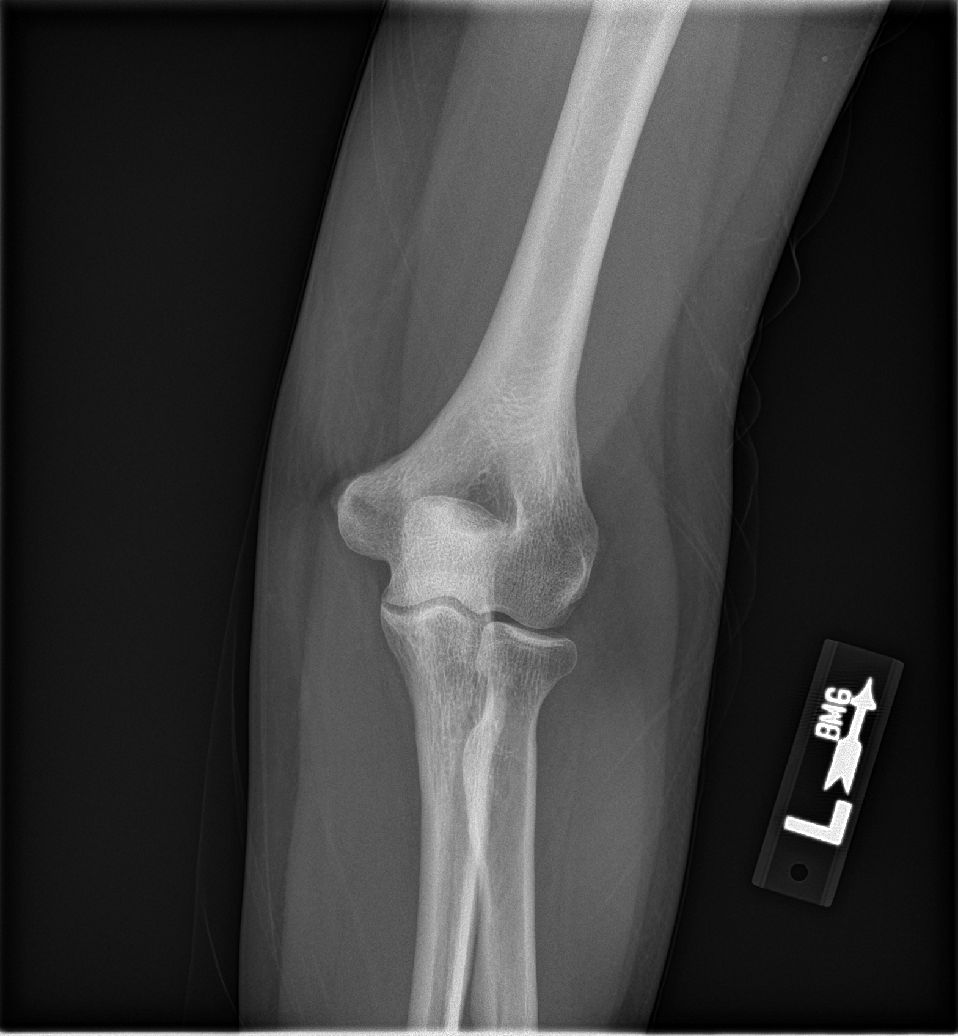

[elbow obl (1 of 2)]
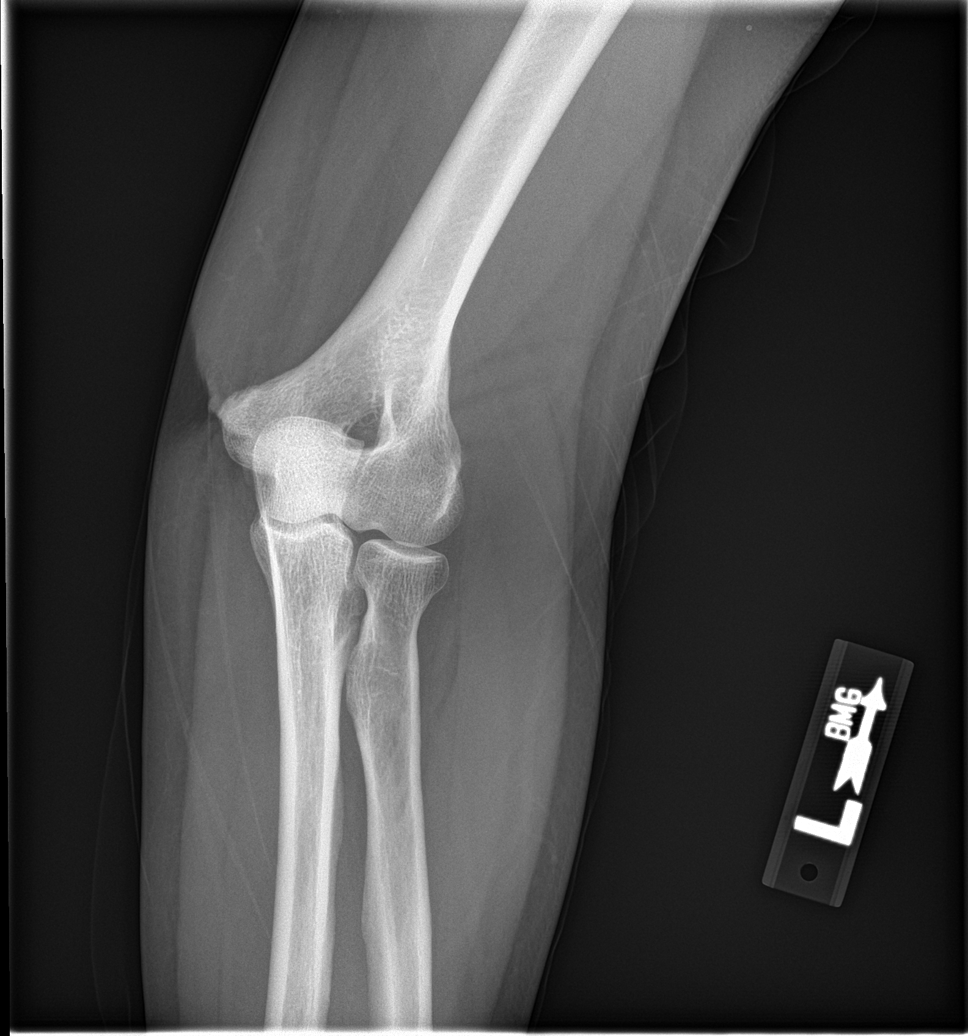

[elbow obl (2 of 2)]
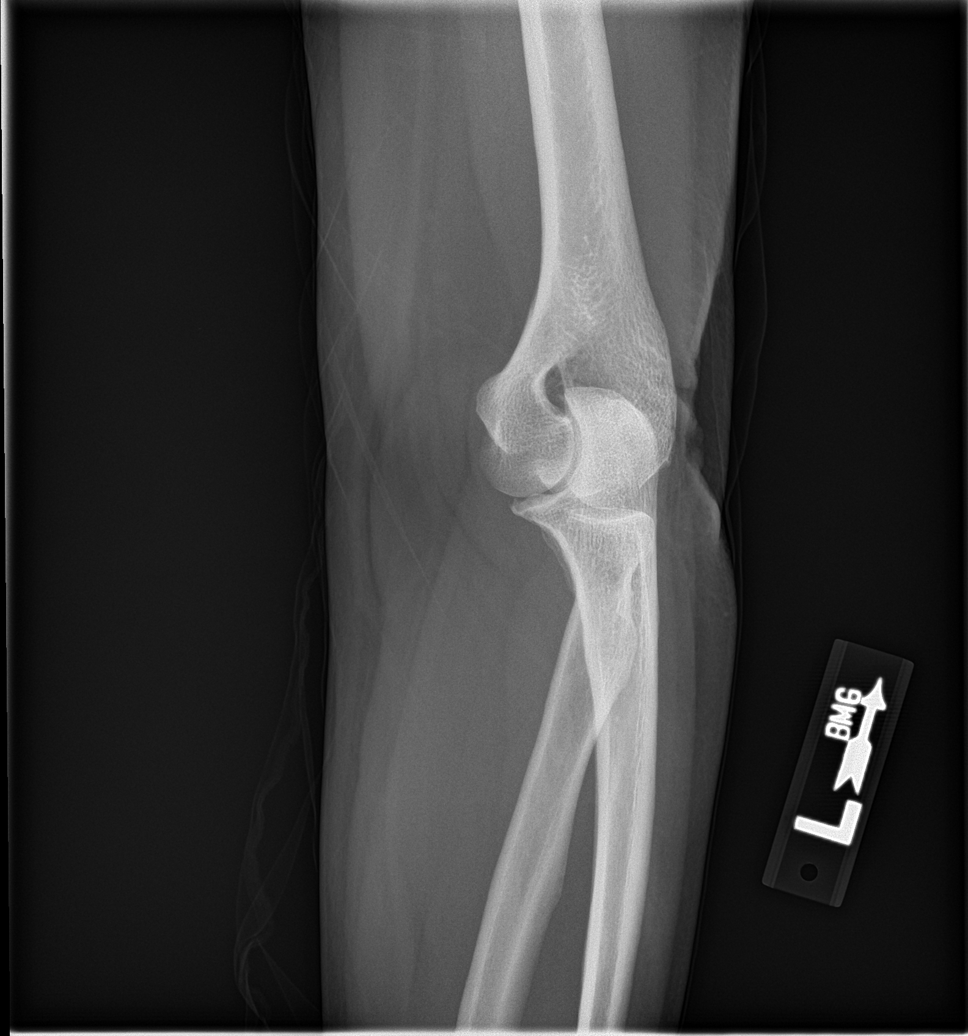

[elbow lat]
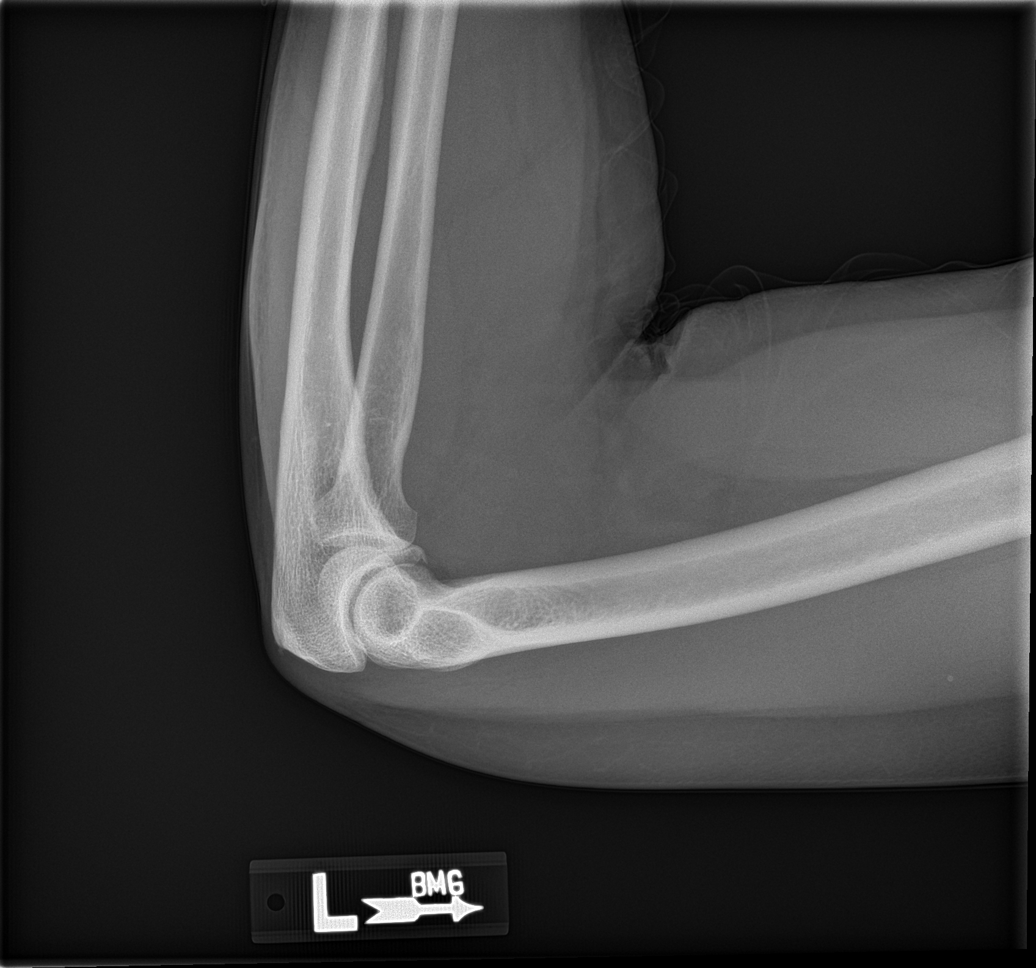

[4 of 4 positions shown; findings below may reference images not displayed]

FINDINGS: No evidence of acute, subacute or healed fractures. Well-preserved
joint spaces. Tiny spur arising from the coronoid process of the
ulna. Minimal calcification at the insertion of the triceps tendon
on the olecranon. No other intrinsic osseous abnormalities.
Well-preserved bone mineral density. No POSTERIOR fat pad sign.
IMPRESSION: No acute or subacute osseous abnormality.

## 2019-07-20 ENCOUNTER — Other Ambulatory Visit: Payer: Self-pay | Admitting: Family Medicine

## 2019-07-20 DIAGNOSIS — I10 Essential (primary) hypertension: Secondary | ICD-10-CM

## 2019-07-20 NOTE — Telephone Encounter (Signed)
Forwarding medication refill request to the clinical pool for review. 

## 2019-07-21 NOTE — Telephone Encounter (Signed)
LVTMCB and schedule ov for med refills

## 2019-07-21 NOTE — Telephone Encounter (Signed)
No further refills without office visit. Courtesy refill has been sent.

## 2019-07-21 NOTE — Telephone Encounter (Signed)
No further refills without office visit. Courtesy refill has been sent but need to be seen within 30days

## 2019-07-23 ENCOUNTER — Other Ambulatory Visit: Payer: Self-pay | Admitting: Family Medicine

## 2019-07-23 DIAGNOSIS — M1990 Unspecified osteoarthritis, unspecified site: Secondary | ICD-10-CM

## 2019-08-25 ENCOUNTER — Other Ambulatory Visit: Payer: Self-pay | Admitting: Family Medicine

## 2019-08-25 DIAGNOSIS — I10 Essential (primary) hypertension: Secondary | ICD-10-CM

## 2019-08-25 NOTE — Telephone Encounter (Signed)
Forwarding medication refill request to the clinical pool for review. 

## 2019-08-30 ENCOUNTER — Other Ambulatory Visit: Payer: Self-pay

## 2019-08-30 DIAGNOSIS — Z20822 Contact with and (suspected) exposure to covid-19: Secondary | ICD-10-CM

## 2019-09-01 LAB — NOVEL CORONAVIRUS, NAA: SARS-CoV-2, NAA: NOT DETECTED

## 2019-09-14 ENCOUNTER — Other Ambulatory Visit: Payer: Self-pay | Admitting: Family Medicine

## 2019-09-14 DIAGNOSIS — M1990 Unspecified osteoarthritis, unspecified site: Secondary | ICD-10-CM

## 2019-09-17 ENCOUNTER — Other Ambulatory Visit: Payer: Self-pay | Admitting: Family Medicine

## 2019-09-18 ENCOUNTER — Other Ambulatory Visit: Payer: Self-pay | Admitting: Family Medicine

## 2019-09-18 DIAGNOSIS — F32A Depression, unspecified: Secondary | ICD-10-CM

## 2019-09-18 DIAGNOSIS — F329 Major depressive disorder, single episode, unspecified: Secondary | ICD-10-CM

## 2019-09-18 DIAGNOSIS — F3341 Major depressive disorder, recurrent, in partial remission: Secondary | ICD-10-CM

## 2019-09-18 DIAGNOSIS — F419 Anxiety disorder, unspecified: Secondary | ICD-10-CM

## 2019-09-18 NOTE — Telephone Encounter (Signed)
Requested medication (s) are due for refill today: yes  Requested medication (s) are on the active medication list:yes  Last refill:  07/21/2019  Future visit scheduled: no  Notes to clinic:  LOV-03/06/2019 Overdue for office visit    Requested Prescriptions  Pending Prescriptions Disp Refills   venlafaxine XR (EFFEXOR-XR) 75 MG 24 hr capsule [Pharmacy Med Name: VENLAFAXINE HCL ER 75 MG CAP] 90 capsule 5    Sig: TAKE 3 CAPSULES (225 MG TOTAL) BY MOUTH DAILY WITH BREAKFAST.      Psychiatry: Antidepressants - SNRI - desvenlafaxine & venlafaxine Failed - 09/18/2019 12:50 AM      Failed - LDL in normal range and within 360 days    LDL Calculated  Date Value Ref Range Status  01/13/2019 115 (H) 0 - 99 mg/dL Final          Failed - Last BP in normal range    BP Readings from Last 1 Encounters:  04/20/19 140/88          Failed - Valid encounter within last 6 months    Recent Outpatient Visits           6 months ago Breast pain, right   Primary Care at [facility], Arlie Solomons, MD   7 months ago Arthritis   Primary Care at [facility], Arlie Solomons, MD   8 months ago Arthritis   Primary Care at [facility], Arlie Solomons, MD   8 months ago Essential hypertension   Primary Care at [facility], Turner, MD   10 months ago Elbow pain, chronic, right   Primary Care at [place], [place]              Passed - Total Cholesterol in normal range and within 360 days    Cholesterol, Total  Date Value Ref Range Status  01/13/2019 196 100 - 199 mg/dL Final          Passed - Triglycerides in normal range and within 360 days    Triglycerides  Date Value Ref Range Status  01/13/2019 145 0 - 149 mg/dL Final          Passed - Completed PHQ-2 or PHQ-9 in the last 360 days.

## 2019-09-20 ENCOUNTER — Other Ambulatory Visit: Payer: Self-pay | Admitting: Family Medicine

## 2019-09-20 DIAGNOSIS — I10 Essential (primary) hypertension: Secondary | ICD-10-CM

## 2019-09-20 NOTE — Telephone Encounter (Signed)
Requested medication (s) are due for refill today: yes  Requested medication (s) are on the active medication list: yes  Last refill:  08/28/2019  Future visit scheduled: no  Notes to clinic:  no valid encounter within last 6 months   Requested Prescriptions  Pending Prescriptions Disp Refills   amLODipine (NORVASC) 5 MG tablet [Pharmacy Med Name: AMLODIPINE BESYLATE 5 MG TAB] 30 tablet 0    Sig: TAKE 1 TABLET BY MOUTH EVERY DAY      Cardiovascular:  Calcium Channel Blockers Failed - 09/20/2019 11:30 AM      Failed - Last BP in normal range    BP Readings from Last 1 Encounters:  04/20/19 140/88          Failed - Valid encounter within last 6 months    Recent Outpatient Visits           6 months ago Breast pain, right   Primary Care at [facility], Arlie Solomons, MD   7 months ago Arthritis   Primary Care at [facility], Arlie Solomons, MD   8 months ago Arthritis   Primary Care at [place], MD   8 months ago Essential hypertension   Primary Care at [facility], Arlie Solomons, MD   10 months ago Elbow pain, chronic, right   [facility] at [facility], Ranell Patrick, MD

## 2019-09-20 NOTE — Telephone Encounter (Signed)
Please schedule patient an follow up appt for any further refills

## 2019-09-25 NOTE — Telephone Encounter (Signed)
Pt advised to make office visit with [facility] for further refills

## 2019-11-08 ENCOUNTER — Other Ambulatory Visit: Payer: Self-pay | Admitting: Family Medicine

## 2019-11-08 DIAGNOSIS — M1990 Unspecified osteoarthritis, unspecified site: Secondary | ICD-10-CM

## 2019-11-08 NOTE — Telephone Encounter (Signed)
Requested Prescriptions  Pending Prescriptions Disp Refills  . meloxicam (MOBIC) 7.5 MG tablet [Pharmacy Med Name: MELOXICAM 7.5 MG TABLET] 30 tablet 1    Sig: TAKE 1 TABLET BY MOUTH EVERY DAY     Analgesics:  COX2 Inhibitors Passed - 11/08/2019  2:11 AM      Passed - HGB in normal range and within 360 days    Hemoglobin  Date Value Ref Range Status  01/13/2019 13.8 11.1 - 15.9 g/dL Final         Passed - Cr in normal range and within 360 days    Creat  Date Value Ref Range Status  03/19/2015 0.60 0.50 - 1.10 mg/dL Final   Creatinine, Ser  Date Value Ref Range Status  01/13/2019 0.72 0.57 - 1.00 mg/dL Final         Passed - Patient is not pregnant      Passed - Valid encounter within last 12 months    Recent Outpatient Visits          8 months ago Breast pain, right   Primary Care at [place], MD   9 months ago Arthritis   Primary Care at [place], MD   9 months ago Arthritis   Primary Care at [facility], Arlie Solomons, MD   10 months ago Essential hypertension   Primary Care at [facility], Arlie Solomons, MD   1 year ago Elbow pain, chronic, right   Primary Care at [facility], Ranell Patrick, MD

## 2019-12-11 ENCOUNTER — Other Ambulatory Visit: Payer: Self-pay | Admitting: Family Medicine

## 2020-01-03 ENCOUNTER — Other Ambulatory Visit: Payer: Self-pay | Admitting: Family Medicine

## 2020-01-03 DIAGNOSIS — M1990 Unspecified osteoarthritis, unspecified site: Secondary | ICD-10-CM

## 2020-01-03 NOTE — Telephone Encounter (Signed)
Requested Prescriptions  Pending Prescriptions Disp Refills  . meloxicam (MOBIC) 7.5 MG tablet [Pharmacy Med Name: MELOXICAM 7.5 MG TABLET] 30 tablet 1    Sig: TAKE 1 TABLET BY MOUTH EVERY DAY     Analgesics:  COX2 Inhibitors Passed - 01/03/2020  2:20 AM      Passed - HGB in normal range and within 360 days    Hemoglobin  Date Value Ref Range Status  01/13/2019 13.8 11.1 - 15.9 g/dL Final         Passed - Cr in normal range and within 360 days    Creat  Date Value Ref Range Status  03/19/2015 0.60 0.50 - 1.10 mg/dL Final   Creatinine, Ser  Date Value Ref Range Status  01/13/2019 0.72 0.57 - 1.00 mg/dL Final         Passed - Patient is not pregnant      Passed - Valid encounter within last 12 months    Recent Outpatient Visits          10 months ago Breast pain, right   Primary Care at [place], MD   11 months ago Arthritis   Primary Care at [facility], Arlie Solomons, MD   11 months ago Arthritis   Primary Care at [facility], Arlie Solomons, MD   12 months ago Essential hypertension   Primary Care at [facility], Arlie Solomons, MD   1 year ago Elbow pain, chronic, right   Primary Care at [facility], Ranell Patrick, MD

## 2020-03-07 ENCOUNTER — Other Ambulatory Visit: Payer: Self-pay

## 2020-03-07 DIAGNOSIS — F32A Depression, unspecified: Secondary | ICD-10-CM

## 2020-03-07 DIAGNOSIS — F419 Anxiety disorder, unspecified: Secondary | ICD-10-CM

## 2020-03-07 DIAGNOSIS — F3341 Major depressive disorder, recurrent, in partial remission: Secondary | ICD-10-CM

## 2020-03-07 MED ORDER — VENLAFAXINE HCL ER 75 MG PO CP24: 225.0000 mg | ORAL_CAPSULE | Freq: Every day | ORAL | 0 refills | Status: DC

## 2020-03-07 NOTE — Telephone Encounter (Signed)
Left detailed message regarding follow appt due for follow up on chronic medical conditions. Courtesy refill sent to pharmacy .

## 2020-03-20 ENCOUNTER — Encounter: Payer: Self-pay | Admitting: Family Medicine

## 2020-03-20 ENCOUNTER — Ambulatory Visit: Payer: 59 | Admitting: Family Medicine

## 2020-03-20 ENCOUNTER — Other Ambulatory Visit: Payer: Self-pay

## 2020-03-20 DIAGNOSIS — F329 Major depressive disorder, single episode, unspecified: Secondary | ICD-10-CM

## 2020-03-20 DIAGNOSIS — F3341 Major depressive disorder, recurrent, in partial remission: Secondary | ICD-10-CM | POA: Diagnosis not present

## 2020-03-20 DIAGNOSIS — I10 Essential (primary) hypertension: Secondary | ICD-10-CM | POA: Diagnosis not present

## 2020-03-20 DIAGNOSIS — F419 Anxiety disorder, unspecified: Secondary | ICD-10-CM | POA: Diagnosis not present

## 2020-03-20 DIAGNOSIS — F32A Depression, unspecified: Secondary | ICD-10-CM

## 2020-03-20 DIAGNOSIS — M1990 Unspecified osteoarthritis, unspecified site: Secondary | ICD-10-CM | POA: Diagnosis not present

## 2020-03-20 MED ORDER — MELOXICAM 7.5 MG PO TABS: 7.5000 mg | ORAL_TABLET | Freq: Every day | ORAL | 0 refills | Status: DC

## 2020-03-20 MED ORDER — VENLAFAXINE HCL ER 75 MG PO CP24: 225.0000 mg | ORAL_CAPSULE | Freq: Every day | ORAL | 1 refills | Status: DC

## 2020-03-20 MED ORDER — GABAPENTIN 300 MG PO CAPS: ORAL_CAPSULE | ORAL | 0 refills | Status: DC

## 2020-03-20 MED ORDER — BUSPIRONE HCL 5 MG PO TABS: 5.0000 mg | ORAL_TABLET | Freq: Two times a day (BID) | ORAL | 1 refills | Status: DC | PRN

## 2020-03-20 MED ORDER — AMLODIPINE BESYLATE 5 MG PO TABS: 5.0000 mg | ORAL_TABLET | Freq: Every day | ORAL | 1 refills | Status: DC

## 2020-03-20 NOTE — Progress Notes (Signed)
Subjective:  Patient ID: Rachel Roth, female    DOB: Aug 09, 1966  Age: 54 y.o. MRN: [id number]  CC:  Chief Complaint  Patient presents with  . Medication Refill    pt is here for a refill on Venlafaxine. pt reports she take this medication as prescribed with no known side effects. Pt reports the medication works some times and other times it dosen't.    HPI Rachel Roth presents for  Depression: With anxiety.  Treated with Effexor XR 225 mg daily.  Has been prescribed BuSpar 5 mg twice daily as well.  Previous patient of Dr. Nolon Rod.   meds doing well most of the time. Some times has moments of anxiety. Doing ok at work overall. Occasional sad days, worse in the winter. More depressed during winter. No SI/HI. Got into a funk about 6 weeks ago - more depressed. Better now.  No missed doses of effexor.  No therapist.  No regular exercise, but active person, active outside. Less in winter. Likes to [employer].  Summer is better.  Has not been on buspar recently - not sure of effectiveness prior, but no side effects.  On gabapentin for restless legs.   Depression screen [facility] 2/9 03/20/2020 03/06/2019 02/06/2019 01/06/2019 11/08/2018  Decreased Interest 0 0 0 0 0  Down, Depressed, Hopeless 0 0 0 0 0  PHQ - 2 Score 0 0 0 0 0  Altered sleeping - - - - -  Tired, decreased energy - - - - -  Change in appetite - - - - -  Feeling bad or failure about yourself  - - - - -  Trouble concentrating - - - - -  Moving slowly or fidgety/restless - - - - -  Suicidal thoughts - - - - -  PHQ-9 Score - - - - -  Difficult doing work/chores - - - - -   No flowsheet data found.  On mobic for hip and neck arthritis. Takes mobic once per day. Some in collarbone, shoulder at times.   Hypertension: Prior on amlodipine 5mg  qd - out for few months, high at times at  Home, with occasional HA when high only.   BP Readings from Last 3 Encounters:  03/20/20 136/78  04/20/19 140/88  03/06/19 131/82    Lab Results  Component Value Date   CREATININE 0.62 03/20/2020       History Patient Active Problem List   Diagnosis Date Noted  . Recurrent major depressive disorder, in partial remission ([place]) 04/12/2018  . Arthritis 10/25/2016  . RLS (restless legs syndrome) 03/19/2015  . Lower back pain 07/09/2012  . Hip pain 07/09/2012  . Cervical radiculopathy 05/29/2012  . Anxiety 05/29/2012  . Insomnia 05/29/2012  . BMI 31.0-31.9,adult 05/29/2012  . Depression 12/17/2011   Past Medical History:  Diagnosis Date  . Allergy   . Arthritis    Past Surgical History:  Procedure Laterality Date  . ABDOMINAL HYSTERECTOMY    . CESAREAN SECTION     Allergies  Allergen Reactions  . Codeine Nausea And Vomiting  . Meloxicam Hypertension   Prior to Admission medications   Medication Sig Start Date End Date Taking? Authorizing Provider  amLODipine (NORVASC) 5 MG tablet TAKE 1 TABLET BY MOUTH EVERY DAY 08/28/19  Yes Delia Chimes A, MD  APPLE CIDER VINEGAR PO Take by mouth.   Yes [provider]  busPIRone (BUSPAR) 5 MG tablet Take 1 tablet (5 mg total) by mouth 2 (two) times daily as needed.  01/06/19  Yes Forrest Moron, MD  diclofenac (VOLTAREN) 75 MG EC tablet Take 1 tablet (75 mg total) by mouth 2 (two) times daily. 04/20/19  Yes Regal, Tamala Fothergill, DPM  diclofenac sodium (VOLTAREN) 1 % GEL Apply 2 g topically 4 (four) times daily. 01/06/19  Yes Stallings, Zoe A, MD  gabapentin (NEURONTIN) 300 MG capsule TAKE 1 CAPSULE BY MOUTH EVERY DAY 12/11/19  Yes Stallings, Zoe A, MD  meloxicam (MOBIC) 7.5 MG tablet TAKE 1 TABLET BY MOUTH EVERY DAY 01/03/20  Yes Stallings, Zoe A, MD  triamcinolone cream (KENALOG) 0.5 % APPLY TO AFFECTED AREA TWICE A DAY 10/23/18  Yes [provider]  venlafaxine XR (EFFEXOR-XR) 75 MG 24 hr capsule Take 3 capsules (225 mg total) by mouth daily with breakfast. 03/07/20  Yes Rutherford Guys, MD   Social History   Socioeconomic History  . Marital status:  Married    Spouse name: Not on file  . Number of children: Not on file  . Years of education: Not on file  . Highest education level: Not on file  Occupational History  . Not on file  Tobacco Use  . Smoking status: Current Every Day Smoker    Packs/day: 0.50    Types: Cigarettes  . Smokeless tobacco: Never Used  Substance and Sexual Activity  . Alcohol use: No  . Drug use: No  . Sexual activity: Yes    Birth control/protection: None  Other Topics Concern  . Not on file  Social History Narrative  . Not on file   Social Determinants of Health   Financial Resource Strain:   . Difficulty of Paying Living Expenses:   Food Insecurity:   . Worried About Charity fundraiser in the Last Year:   . Arboriculturist in the Last Year:   Transportation Needs:   . Film/video editor (Medical):   Marland Kitchen Lack of Transportation (Non-Medical):   Physical Activity:   . Days of Exercise per Week:   . Minutes of Exercise per Session:   Stress:   . Feeling of Stress :   Social Connections:   . Frequency of Communication with Friends and Family:   . Frequency of Social Gatherings with Friends and Family:   . Attends Religious Services:   . Active Member of Clubs or Organizations:   . Attends Archivist Meetings:   Marland Kitchen Marital Status:   Intimate Partner Violence:   . Fear of Current or Ex-Partner:   . Emotionally Abused:   Marland Kitchen Physically Abused:   . Sexually Abused:     Review of Systems  Constitutional: Negative for fatigue and unexpected weight change.  Respiratory: Negative for chest tightness and shortness of breath.   Cardiovascular: Negative for chest pain, palpitations and leg swelling.  Gastrointestinal: Negative for abdominal pain and blood in stool.  Neurological: Negative for dizziness, syncope, light-headedness and headaches.     Objective:   Vitals:   03/20/20 1116  BP: 136/78  Pulse: 97  Temp: 98 F (36.7 C)  TempSrc: Temporal  SpO2: 96%  Weight: 165 lb  (74.8 kg)  Height: 5\' 3"  (1.6 m)     Physical Exam Vitals reviewed.  Constitutional:      Appearance: She is well-developed.  HENT:     Head: Normocephalic and atraumatic.  Eyes:     Conjunctiva/sclera: Conjunctivae normal.     Pupils: Pupils are equal, round, and reactive to light.  Neck:     Vascular: No  carotid bruit.  Cardiovascular:     Rate and Rhythm: Normal rate and regular rhythm.     Heart sounds: Normal heart sounds.  Pulmonary:     Effort: Pulmonary effort is normal.     Breath sounds: Normal breath sounds.  Abdominal:     Palpations: Abdomen is soft. There is no pulsatile mass.     Tenderness: There is no abdominal tenderness.  Skin:    General: Skin is warm and dry.  Neurological:     Mental Status: She is alert and oriented to person, place, and time.  Psychiatric:        Behavior: Behavior normal.        Assessment & Plan:  Rachel Roth is a 54 y.o. female . Anxiety and depression - Plan: venlafaxine XR (EFFEXOR-XR) 75 MG 24 hr capsule, busPIRone (BUSPAR) 5 MG tablet Recurrent major depressive disorder, in partial remission ([place]) - Plan: venlafaxine XR (EFFEXOR-XR) 75 MG 24 hr capsule, busPIRone (BUSPAR) 5 MG tablet  -Intermittent breakthrough symptoms with anxiety, depression.  Counseling recommended and numbers provided.  Restart BuSpar, continue Effexor same dose.  Recheck 6 weeks.  Arthritis - Plan: meloxicam (MOBIC) 7.5 MG tablet  -Continue same regimen for now with plan follow-up to discuss medication further in August.  Essential hypertension - Plan: Basic metabolic panel, amLODipine (NORVASC) 5 MG tablet  -Stable, continue amlodipine, check labs.  Meds ordered this encounter  Medications  . venlafaxine XR (EFFEXOR-XR) 75 MG 24 hr capsule    Sig: Take 3 capsules (225 mg total) by mouth daily with breakfast.    Dispense:  270 capsule    Refill:  1  . meloxicam (MOBIC) 7.5 MG tablet    Sig: Take 1 tablet (7.5 mg total) by mouth  daily.    Dispense:  90 tablet    Refill:  0  . gabapentin (NEURONTIN) 300 MG capsule    Sig: TAKE 1 CAPSULE BY MOUTH EVERY DAY    Dispense:  90 capsule    Refill:  0  . busPIRone (BUSPAR) 5 MG tablet    Sig: Take 1 tablet (5 mg total) by mouth 2 (two) times daily as needed.    Dispense:  180 tablet    Refill:  1  . amLODipine (NORVASC) 5 MG tablet    Sig: Take 1 tablet (5 mg total) by mouth daily.    Dispense:  90 tablet    Refill:  1   Patient Instructions    I would consider meeting with a therapist if more depression or anxiety symptoms.  Try restarting the buspar twice per day (once per day 1st week).  No change in effexor or gabapentin for now.   No other changes - keep follow up as planned in August.   Here are a few options for counseling:  [place]:  [place] [phone]    If you have lab work done today you will be contacted with your lab results within the next 2 weeks.  If you have not heard from [place] then please contact us. The fastest way to get your results is to register for My Chart.   IF you received an x-ray today, you will receive an invoice from [facility]. Please contact [facility] at [phone] with questions or concerns regarding your invoice.   IF you received labwork today, you will receive an invoice from [place]. Please contact [facility] at [phone] with questions or concerns regarding your invoice.  Our billing staff will not be able to assist you with questions regarding bills from these companies.  You will be contacted with the lab results as soon as they are available. The fastest way to get your results is to activate your My Chart account. Instructions are located on the last page of this paperwork. If you have not heard from [place] regarding the results in 2 weeks, please contact this office.         Signed, Merri Ray, MD [facility]

## 2020-03-20 NOTE — Patient Instructions (Addendum)
  I would consider meeting with a therapist if more depression or anxiety symptoms.  Try restarting the buspar twice per day (once per day 1st week).  No change in effexor or gabapentin for now.   No other changes - keep follow up as planned in August.   Here are a few options for counseling:  [place]:  [place] [phone]    If you have lab work done today you will be contacted with your lab results within the next 2 weeks.  If you have not heard from [place] then please contact us. The fastest way to get your results is to register for My Chart.   IF you received an x-ray today, you will receive an invoice from [facility]. Please contact [facility] at [phone] with questions or concerns regarding your invoice.   IF you received labwork today, you will receive an invoice from [place]. Please contact [facility] at [phone] with questions or concerns regarding your invoice.   Our billing staff will not be able to assist you with questions regarding bills from these companies.  You will be contacted with the lab results as soon as they are available. The fastest way to get your results is to activate your My Chart account. Instructions are located on the last page of this paperwork. If you have not heard from [place] regarding the results in 2 weeks, please contact this office.

## 2020-03-21 ENCOUNTER — Encounter: Payer: Self-pay | Admitting: Family Medicine

## 2020-03-21 LAB — BASIC METABOLIC PANEL
BUN/Creatinine Ratio: 23 (ref 9–23)
BUN: 14 mg/dL (ref 6–24)
CO2: 26 mmol/L (ref 20–29)
Calcium: 9.4 mg/dL (ref 8.7–10.2)
Chloride: 105 mmol/L (ref 96–106)
Creatinine, Ser: 0.62 mg/dL (ref 0.57–1.00)
GFR calc Af Amer: 119 mL/min/{1.73_m2} (ref >59)
GFR calc non Af Amer: 103 mL/min/{1.73_m2} (ref >59)
Glucose: 104 mg/dL — ABNORMAL HIGH (ref 65–99)
Potassium: 3.8 mmol/L (ref 3.5–5.2)
Sodium: 144 mmol/L (ref 134–144)

## 2020-04-13 ENCOUNTER — Ambulatory Visit (HOSPITAL_COMMUNITY)
Admission: EM | Admit: 2020-04-13 | Discharge: 2020-04-13 | Disposition: A | Discharge status: Home / Self Care | Payer: 59 | Attending: Urgent Care | Admitting: Urgent Care

## 2020-04-13 ENCOUNTER — Other Ambulatory Visit: Payer: Self-pay

## 2020-04-13 ENCOUNTER — Encounter (HOSPITAL_COMMUNITY): Payer: Self-pay

## 2020-04-13 DIAGNOSIS — M25611 Stiffness of right shoulder, not elsewhere classified: Secondary | ICD-10-CM

## 2020-04-13 DIAGNOSIS — M25511 Pain in right shoulder: Secondary | ICD-10-CM

## 2020-04-13 MED ORDER — METHYLPREDNISOLONE ACETATE 80 MG/ML IJ SUSP
INTRAMUSCULAR | Status: AC
  Filled 2020-04-13: qty 1

## 2020-04-13 MED ORDER — METHYLPREDNISOLONE ACETATE 80 MG/ML IJ SUSP
80.0000 mg | Freq: Once | INTRAMUSCULAR | Status: AC
  Administered 2020-04-13: 80 mg via INTRAMUSCULAR

## 2020-04-13 MED ORDER — TIZANIDINE HCL 4 MG PO TABS: 4.0000 mg | ORAL_TABLET | Freq: Three times a day (TID) | ORAL | 0 refills | Status: DC | PRN

## 2020-04-13 NOTE — ED Provider Notes (Signed)
[place]   MRN: [id number] DOB: July 16, 1966  Subjective:   Rachel Roth is a 54 y.o. female presenting for 4 day hx of acute onset recurrent right shoulder pain. Sx started after she was on a ride that spins around 360 in the air, the next morning had her shoulder pain. Has had stiffness, persistent pain, decreased ROM. Has been told that she may have arthritis. Does not have an orthopedist. Uses meloxicam regularly for her arthritis. No traumas, falls, swelling.   No current facility-administered medications for this encounter.  Current Outpatient Medications:  .  amLODipine (NORVASC) 5 MG tablet, Take 1 tablet (5 mg total) by mouth daily., Disp: 90 tablet, Rfl: 1 .  APPLE CIDER VINEGAR PO, Take by mouth., Disp: , Rfl:  .  busPIRone (BUSPAR) 5 MG tablet, Take 1 tablet (5 mg total) by mouth 2 (two) times daily as needed., Disp: 180 tablet, Rfl: 1 .  gabapentin (NEURONTIN) 300 MG capsule, TAKE 1 CAPSULE BY MOUTH EVERY DAY, Disp: 90 capsule, Rfl: 0 .  meloxicam (MOBIC) 7.5 MG tablet, Take 1 tablet (7.5 mg total) by mouth daily., Disp: 90 tablet, Rfl: 0 .  triamcinolone cream (KENALOG) 0.5 %, APPLY TO AFFECTED AREA TWICE A DAY, Disp: , Rfl:  .  venlafaxine XR (EFFEXOR-XR) 75 MG 24 hr capsule, Take 3 capsules (225 mg total) by mouth daily with breakfast., Disp: 270 capsule, Rfl: 1   Allergies  Allergen Reactions  . Codeine Nausea And Vomiting  . Meloxicam Hypertension    Past Medical History:  Diagnosis Date  . Allergy   . Arthritis      Past Surgical History:  Procedure Laterality Date  . ABDOMINAL HYSTERECTOMY    . CESAREAN SECTION      Family History  Problem Relation Age of Onset  . Diabetes Mother   . Cancer Father 64       Lung cancer  . Diabetes Sister   . Diabetes Brother   . Diabetes Sister   . Cancer Sister 31       breast cancer  . Cancer Maternal Aunt 50       breast cancer  . Cancer Maternal Grandmother        breast cancer    Social  History   Tobacco Use  . Smoking status: Current Every Day Smoker    Packs/day: 0.50    Types: Cigarettes  . Smokeless tobacco: Never Used  Substance Use Topics  . Alcohol use: No  . Drug use: No    ROS   Objective:   Vitals: BP 140/73 (BP Location: Left Arm)   Pulse 74   Temp 98 F (36.7 C) (Oral)   Resp 17   SpO2 100%   Physical Exam Constitutional:      General: She is not in acute distress.    Appearance: Normal appearance. She is well-developed. She is not ill-appearing, toxic-appearing or diaphoretic.  HENT:     Head: Normocephalic and atraumatic.     Nose: Nose normal.     Mouth/Throat:     Mouth: Mucous membranes are moist.     Pharynx: Oropharynx is clear.  Eyes:     General: No scleral icterus.       Right eye: No discharge.        Left eye: No discharge.     Extraocular Movements: Extraocular movements intact.     Conjunctiva/sclera: Conjunctivae normal.     Pupils: Pupils are equal, round, and reactive to light.  Cardiovascular:     Rate and Rhythm: Normal rate.  Pulmonary:     Effort: Pulmonary effort is normal.  Musculoskeletal:     Right shoulder: Tenderness (across anterior-lateral deltoid and AC joint) present. No swelling, deformity, effusion, laceration, bony tenderness or crepitus. Decreased range of motion. Decreased strength.  Skin:    General: Skin is warm and dry.  Neurological:     General: No focal deficit present.     Mental Status: She is alert and oriented to person, place, and time.  Psychiatric:        Mood and Affect: Mood normal.        Behavior: Behavior normal.        Thought Content: Thought content normal.        Judgment: Judgment normal.      Assessment and Plan :   PDMP not reviewed this encounter.  1. Acute pain of right shoulder   2. Decreased range of motion of right shoulder     IM Depomedrol in clinic. Scheduled APAP, use tizanidine. Follow up with ortho for suspected arthritis of left shoulder.  Deferred imaging due to lack of trauma, bruising, swelling warranting it. Counseled patient on potential for adverse effects with medications prescribed/recommended today, ER and return-to-clinic precautions discussed, patient verbalized understanding.    Jaynee Eagles, [place] 04/13/20 1805

## 2020-04-13 NOTE — ED Triage Notes (Signed)
Pt presents with right shoulder pain x 4 days. States she woke up with stiffness and pain in the right shoulder. Denies pain, numbness or tingling in right arm or right hand.

## 2020-04-13 NOTE — Discharge Instructions (Signed)
Please just use Tylenol at a dose of 500mg-650mg once every 6 hours as needed for your aches, pains, fevers. Do not use any nonsteroidal anti-inflammatories (NSAIDs) like ibuprofen, Motrin, naproxen, Aleve, etc. which are all available over-the-counter.   

## 2020-04-30 ENCOUNTER — Other Ambulatory Visit: Payer: Self-pay | Admitting: Family Medicine

## 2020-04-30 DIAGNOSIS — F32A Depression, unspecified: Secondary | ICD-10-CM

## 2020-04-30 DIAGNOSIS — F329 Major depressive disorder, single episode, unspecified: Secondary | ICD-10-CM

## 2020-04-30 DIAGNOSIS — F419 Anxiety disorder, unspecified: Secondary | ICD-10-CM

## 2020-04-30 DIAGNOSIS — F3341 Major depressive disorder, recurrent, in partial remission: Secondary | ICD-10-CM

## 2020-04-30 NOTE — Telephone Encounter (Signed)
Requested medication (s) are due for refill today: Yes  Requested medication (s) are on the active medication list: Yes  Last refill:  03/08/20  Future visit scheduled: Yes  Notes to clinic:  Diagnosis code needed, unable to refill     Requested Prescriptions  Pending Prescriptions Disp Refills   venlafaxine XR (EFFEXOR-XR) 75 MG 24 hr capsule [Pharmacy Med Name: VENLAFAXINE HCL ER 75 MG CAP] 90 capsule     Sig: Take 3 capsules (225 mg total) by mouth daily with breakfast.      Psychiatry: Antidepressants - SNRI - desvenlafaxine & venlafaxine Failed - 04/30/2020  3:13 PM      Failed - LDL in normal range and within 360 days    LDL Calculated  Date Value Ref Range Status  01/13/2019 115 (H) 0 - 99 mg/dL Final          Failed - Total Cholesterol in normal range and within 360 days    Cholesterol, Total  Date Value Ref Range Status  01/13/2019 196 100 - 199 mg/dL Final          Failed - Triglycerides in normal range and within 360 days    Triglycerides  Date Value Ref Range Status  01/13/2019 145 0 - 149 mg/dL Final          Failed - Last BP in normal range    BP Readings from Last 1 Encounters:  04/13/20 140/73          Passed - Completed PHQ-2 or PHQ-9 in the last 360 days.      Passed - Valid encounter within last 6 months    Recent Outpatient Visits           1 month ago Anxiety and depression   [facility] at [facility], Ranell Patrick, MD   1 year ago Breast pain, right   Primary Care at [facility], Arlie Solomons, MD   1 year ago Arthritis   Primary Care at [facility], Arlie Solomons, MD   1 year ago Arthritis   Primary Care at [facility], Arlie Solomons, MD   1 year ago Essential hypertension   Primary Care at [facility], Arlie Solomons, MD       Future Appointments             Tomorrow Wendie Agreste, MD Primary Care at [place], [facility]

## 2020-05-01 ENCOUNTER — Ambulatory Visit (INDEPENDENT_AMBULATORY_CARE_PROVIDER_SITE_OTHER): Payer: 59

## 2020-05-01 ENCOUNTER — Encounter: Payer: Self-pay | Admitting: Family Medicine

## 2020-05-01 ENCOUNTER — Ambulatory Visit: Payer: 59 | Admitting: Family Medicine

## 2020-05-01 ENCOUNTER — Other Ambulatory Visit: Payer: Self-pay

## 2020-05-01 VITALS — BP 125/78 | HR 86 | Temp 98.2°F | Ht 63.0 in | Wt 161.0 lb

## 2020-05-01 DIAGNOSIS — M25511 Pain in right shoulder: Secondary | ICD-10-CM

## 2020-05-01 DIAGNOSIS — M542 Cervicalgia: Secondary | ICD-10-CM

## 2020-05-01 DIAGNOSIS — G2581 Restless legs syndrome: Secondary | ICD-10-CM

## 2020-05-01 DIAGNOSIS — F419 Anxiety disorder, unspecified: Secondary | ICD-10-CM

## 2020-05-01 DIAGNOSIS — M1990 Unspecified osteoarthritis, unspecified site: Secondary | ICD-10-CM | POA: Diagnosis not present

## 2020-05-01 DIAGNOSIS — F3341 Major depressive disorder, recurrent, in partial remission: Secondary | ICD-10-CM

## 2020-05-01 DIAGNOSIS — F329 Major depressive disorder, single episode, unspecified: Secondary | ICD-10-CM

## 2020-05-01 DIAGNOSIS — F32A Depression, unspecified: Secondary | ICD-10-CM

## 2020-05-01 MED ORDER — MELOXICAM 7.5 MG PO TABS: 7.5000 mg | ORAL_TABLET | Freq: Every day | ORAL | 0 refills | Status: DC

## 2020-05-01 MED ORDER — GABAPENTIN 300 MG PO CAPS: ORAL_CAPSULE | ORAL | 0 refills | Status: DC

## 2020-05-01 NOTE — Telephone Encounter (Signed)
Discussed at office visit.

## 2020-05-01 NOTE — Progress Notes (Signed)
Subjective:  Patient ID: Rachel Roth, female    DOB: 1966/07/21  Age: 54 y.o. MRN: [id number]  CC:  Chief Complaint  Patient presents with  . Establish Care    Pt repoprts she feels fine except for some pain in her R shoulder. started 3-4 weeks ago. no known trama other than labor and an amusement park ride.Pt isn't fasting.    HPI Rachel Roth presents for   Presents to establish care, she was recently seen June 23, prior patient of Dr. Nolon Rod.  Anxiety: Started on BuSpar for anxiety at June 23 visit.  Continued Effexor, gabapentin.  Phone numbers provided for counseling. Feels better on buspar 5mg  BID.  Out of effexor yesterday and today - refill ordered.  Did not call counseling - doing ok without.  RLS treated with gabapentin. Working well.  Not fasting at last visit.  Continues to take amlodipine for blood pressure.  Right shoulder pain: Pain off and on for awhile - past few months. No injury.  Went on vacation in July - amusement park ride hard to move R arm next day. No pain day of ride. Seen at Urgent care. On 7/17. Given IM depomedrol. Tizanidine as option, but tylenol was helping. Has not tried tizanidine.  Still sore with certain mvmts, but overall better.  Has strength now.some soreness into neck on R side at time of more soreness, some now but better.  mobic 7.5 mg qd for prior neck and hip arthritis.  Sore with repeat use.   C spine XR in 01/2018: FINDINGS: No evidence of fracture, erosion, bone lesion, or prevertebral swelling. Mild-to-moderate disc narrowing and endplate ridging at N8-2 and C5-6. No definite facet disease. Spinal alignment is normal.  IMPRESSION: 1. No acute finding. 2. C4-5 and C5-6 disc degeneration.    History Patient Active Problem List   Diagnosis Date Noted  . Recurrent major depressive disorder, in partial remission ([place]) 04/12/2018  . Arthritis 10/25/2016  . RLS (restless legs syndrome) 03/19/2015  . Lower  back pain 07/09/2012  . Hip pain 07/09/2012  . Cervical radiculopathy 05/29/2012  . Anxiety 05/29/2012  . Insomnia 05/29/2012  . BMI 31.0-31.9,adult 05/29/2012  . Depression 12/17/2011   Past Medical History:  Diagnosis Date  . Allergy   . Arthritis    Past Surgical History:  Procedure Laterality Date  . ABDOMINAL HYSTERECTOMY    . CESAREAN SECTION     Allergies  Allergen Reactions  . Codeine Nausea And Vomiting  . Meloxicam Hypertension   Prior to Admission medications   Medication Sig Start Date End Date Taking? Authorizing Provider  amLODipine (NORVASC) 5 MG tablet Take 1 tablet (5 mg total) by mouth daily. 03/20/20  Yes Wendie Agreste, MD  APPLE CIDER VINEGAR PO Take by mouth.   Yes [provider]  busPIRone (BUSPAR) 5 MG tablet Take 1 tablet (5 mg total) by mouth 2 (two) times daily as needed. 03/20/20  Yes Wendie Agreste, MD  gabapentin (NEURONTIN) 300 MG capsule TAKE 1 CAPSULE BY MOUTH EVERY DAY 03/20/20  Yes Wendie Agreste, MD  meloxicam (MOBIC) 7.5 MG tablet Take 1 tablet (7.5 mg total) by mouth daily. 03/20/20  Yes Wendie Agreste, MD  triamcinolone cream (KENALOG) 0.5 % APPLY TO AFFECTED AREA TWICE A DAY 10/23/18  Yes [provider]  venlafaxine XR (EFFEXOR-XR) 75 MG 24 hr capsule Take 3 capsules (225 mg total) by mouth daily with breakfast. 03/20/20  Yes Wendie Agreste, MD  tiZANidine (ZANAFLEX) 4 MG tablet Take 1 tablet (4 mg total) by mouth every 8 (eight) hours as needed. Patient not taking: Reported on 05/01/2020 04/13/20   Jaynee Eagles, PA-C   Social History   Socioeconomic History  . Marital status: Married    Spouse name: Not on file  . Number of children: Not on file  . Years of education: Not on file  . Highest education level: Not on file  Occupational History  . Not on file  Tobacco Use  . Smoking status: Current Every Day Smoker    Packs/day: 0.50    Types: Cigarettes  . Smokeless tobacco: Never Used  Substance and  Sexual Activity  . Alcohol use: No  . Drug use: No  . Sexual activity: Yes    Birth control/protection: None  Other Topics Concern  . Not on file  Social History Narrative  . Not on file   Social Determinants of Health   Financial Resource Strain:   . Difficulty of Paying Living Expenses:   Food Insecurity:   . Worried About Charity fundraiser in the Last Year:   . Arboriculturist in the Last Year:   Transportation Needs:   . Film/video editor (Medical):   Marland Kitchen Lack of Transportation (Non-Medical):   Physical Activity:   . Days of Exercise per Week:   . Minutes of Exercise per Session:   Stress:   . Feeling of Stress :   Social Connections:   . Frequency of Communication with Friends and Family:   . Frequency of Social Gatherings with Friends and Family:   . Attends Religious Services:   . Active Member of Clubs or Organizations:   . Attends Archivist Meetings:   Marland Kitchen Marital Status:   Intimate Partner Violence:   . Fear of Current or Ex-Partner:   . Emotionally Abused:   Marland Kitchen Physically Abused:   . Sexually Abused:     Review of Systems   Objective:   Vitals:   05/01/20 1519  BP: 125/78  Pulse: 86  Temp: 98.2 F (36.8 C)  TempSrc: Temporal  SpO2: 94%  Weight: 161 lb (73 kg)  Height: 5\' 3"  (1.6 m)     Physical Exam Constitutional:      General: She is not in acute distress.    Appearance: She is well-developed.  HENT:     Head: Normocephalic and atraumatic.  Cardiovascular:     Rate and Rhythm: Normal rate.  Pulmonary:     Effort: Pulmonary effort is normal.  Musculoskeletal:     Comments: C-spine, slight decreased extension, minimal discomfort with right rotation, limited by approximately 20 to 30 degrees, no pain with left rotation.  No radiating pain down the arm, only soreness to the right paraspinals, trapezius.  No midline bony tenderness  Right shoulder full range of motion, slight discomfort at terminal internal rotation.  Slight  tenderness anterior shoulder as well as to [facility] without piano key sign.  Full RTC strength.  Discomfort with crossover, and Hawkins, negative Neer's. Equal grip strength, neurovascular intact distally.  Neurological:     Mental Status: She is alert and oriented to person, place, and time.    DG Cervical Spine Complete  Result Date: 05/01/2020 CLINICAL DATA:  Recurrent right shoulder pain EXAM: CERVICAL SPINE - COMPLETE 4+ VIEW COMPARISON:  None. FINDINGS: There is no evidence of cervical spine fracture or prevertebral soft tissue swelling. Alignment is normal. No other significant bone abnormalities are identified.  Degenerative disease with disc height loss at C4-5. No foraminal stenosis. IMPRESSION: Degenerative disease with disc height loss at C4-5. Electronically Signed   By: Kathreen Devoid   On: 05/01/2020 17:01   DG Shoulder Right  Result Date: 05/01/2020 CLINICAL DATA:  Right shoulder pain, right lateral neck pain EXAM: RIGHT SHOULDER - 2+ VIEW COMPARISON:  None. FINDINGS: There is no fracture or dislocation. There are mild degenerative changes of the acromioclavicular joint. IMPRESSION: No acute osseous injury of the right shoulder. Electronically Signed   By: Kathreen Devoid   On: 05/01/2020 16:55      Assessment & Plan:  ALETHA ALLEBACH is a 54 y.o. female . RLS (restless legs syndrome) - Plan: gabapentin (NEURONTIN) 300 MG capsule  -Continue gabapentin same dose.  Stable.  Anxiety and depression Recurrent major depressive disorder, in partial remission ([facility])  -Stable on Effexor, BuSpar recommended initial partial dose same day, then restart other to next morning.  Return to usual dose in 2 days.  Continue BuSpar same dose  Arthritis - Plan: meloxicam (MOBIC) 7.5 MG tablet Pain in joint of right shoulder - Plan: DG Cervical Spine Complete, DG Shoulder Right Neck pain on right side - Plan: meloxicam (MOBIC) 7.5 MG tablet, DG Cervical Spine Complete, DG Shoulder Right  -Shoulder x-ray  reassuring, potentially radiating pain from cervical spine with degenerative changes as above, disc height loss.  Strength intact.  Trial of meloxicam, gentle range of motion, symptomatic care with tizanidine at night if needed.  Consider Ortho follow-up.  RTC precautions.   Meds ordered this encounter  Medications  . gabapentin (NEURONTIN) 300 MG capsule    Sig: TAKE 1 CAPSULE BY MOUTH EVERY DAY    Dispense:  90 capsule    Refill:  0  . meloxicam (MOBIC) 7.5 MG tablet    Sig: Take 1 tablet (7.5 mg total) by mouth daily.    Dispense:  90 tablet    Refill:  0   Patient Instructions    Take 1 effexor tonight, 2 tomorrow morning, then back to 3 per day on Friday morning.  No change in buspar dose for now.  Shoulder pain could be in part due to your shoulder or possible neck symptoms as well.  Okay to continue meloxicam short-term for now, tizanidine if needed, can also start that at bedtime for spasm, but if you are not improving in the next 2 weeks, I would like to refer you to orthopedics.  Let me know.  If there are any concerns on your x-ray I will let you know.  No other medication changes at this time.   Shoulder Pain Many things can cause shoulder pain, including:  An injury to the shoulder.  Overuse of the shoulder.  Arthritis. The source of the pain can be:  Inflammation.  An injury to the shoulder joint.  An injury to a tendon, ligament, or bone. Follow these instructions at home: Pay attention to changes in your symptoms. Let your health care provider know about them. Follow these instructions to relieve your pain. If you have a sling:  Wear the sling as told by your health care provider. Remove it only as told by your health care provider.  Loosen the sling if your fingers tingle, become numb, or turn cold and blue.  Keep the sling clean.  If the sling is not waterproof: ? Do not let it get wet. Remove it to shower or bathe.  Move your arm as little as  possible,  but keep your hand moving to prevent swelling. Managing pain, stiffness, and swelling   If directed, put ice on the painful area: ? Put ice in a plastic bag. ? Place a towel between your skin and the bag. ? Leave the ice on for 20 minutes, 2-3 times per day. Stop applying ice if it does not help with the pain.  Squeeze a soft ball or a foam pad as much as possible. This helps to keep the shoulder from swelling. It also helps to strengthen the arm. General instructions  Take over-the-counter and prescription medicines only as told by your health care provider.  Keep all follow-up visits as told by your health care provider. This is important. Contact a health care provider if:  Your pain gets worse.  Your pain is not relieved with medicines.  New pain develops in your arm, hand, or fingers. Get help right away if:  Your arm, hand, or fingers: ? Tingle. ? Become numb. ? Become swollen. ? Become painful. ? Turn white or blue. Summary  Shoulder pain can be caused by an injury, overuse, or arthritis.  Pay attention to changes in your symptoms. Let your health care provider know about them.  This condition may be treated with a sling, ice, and pain medicines.  Contact your health care provider if the pain gets worse or new pain develops. Get help right away if your arm, hand, or fingers tingle or become numb, swollen, or painful.  Keep all follow-up visits as told by your health care provider. This is important. This information is not intended to replace advice given to you by your health care provider. Make sure you discuss any questions you have with your health care provider. Document Revised: 03/29/2018 Document Reviewed: 03/29/2018 Elsevier Patient Education  [employer].    If you have lab work done today you will be contacted with your lab results within the next 2 weeks.  If you have not heard from [place] then please contact us. The fastest way to get  your results is to register for My Chart.   IF you received an x-ray today, you will receive an invoice from [facility]. Please contact [facility] at [phone] with questions or concerns regarding your invoice.   IF you received labwork today, you will receive an invoice from [place]. Please contact [facility] at [phone] with questions or concerns regarding your invoice.   Our billing staff will not be able to assist you with questions regarding bills from these companies.  You will be contacted with the lab results as soon as they are available. The fastest way to get your results is to activate your My Chart account. Instructions are located on the last page of this paperwork. If you have not heard from [place] regarding the results in 2 weeks, please contact this office.         Signed, Merri Ray, MD [facility]

## 2020-05-01 NOTE — Patient Instructions (Addendum)
Take 1 effexor tonight, 2 tomorrow morning, then back to 3 per day on Friday morning.  No change in buspar dose for now.  Shoulder pain could be in part due to your shoulder or possible neck symptoms as well.  Okay to continue meloxicam short-term for now, tizanidine if needed, can also start that at bedtime for spasm, but if you are not improving in the next 2 weeks, I would like to refer you to orthopedics.  Let me know.  If there are any concerns on your x-ray I will let you know.  No other medication changes at this time.   Shoulder Pain Many things can cause shoulder pain, including:  An injury to the shoulder.  Overuse of the shoulder.  Arthritis. The source of the pain can be:  Inflammation.  An injury to the shoulder joint.  An injury to a tendon, ligament, or bone. Follow these instructions at home: Pay attention to changes in your symptoms. Let your health care provider know about them. Follow these instructions to relieve your pain. If you have a sling:  Wear the sling as told by your health care provider. Remove it only as told by your health care provider.  Loosen the sling if your fingers tingle, become numb, or turn cold and blue.  Keep the sling clean.  If the sling is not waterproof: ? Do not let it get wet. Remove it to shower or bathe.  Move your arm as little as possible, but keep your hand moving to prevent swelling. Managing pain, stiffness, and swelling   If directed, put ice on the painful area: ? Put ice in a plastic bag. ? Place a towel between your skin and the bag. ? Leave the ice on for 20 minutes, 2-3 times per day. Stop applying ice if it does not help with the pain.  Squeeze a soft ball or a foam pad as much as possible. This helps to keep the shoulder from swelling. It also helps to strengthen the arm. General instructions  Take over-the-counter and prescription medicines only as told by your health care provider.  Keep all  follow-up visits as told by your health care provider. This is important. Contact a health care provider if:  Your pain gets worse.  Your pain is not relieved with medicines.  New pain develops in your arm, hand, or fingers. Get help right away if:  Your arm, hand, or fingers: ? Tingle. ? Become numb. ? Become swollen. ? Become painful. ? Turn white or blue. Summary  Shoulder pain can be caused by an injury, overuse, or arthritis.  Pay attention to changes in your symptoms. Let your health care provider know about them.  This condition may be treated with a sling, ice, and pain medicines.  Contact your health care provider if the pain gets worse or new pain develops. Get help right away if your arm, hand, or fingers tingle or become numb, swollen, or painful.  Keep all follow-up visits as told by your health care provider. This is important. This information is not intended to replace advice given to you by your health care provider. Make sure you discuss any questions you have with your health care provider. Document Revised: 03/29/2018 Document Reviewed: 03/29/2018 Elsevier Patient Education  [employer].    If you have lab work done today you will be contacted with your lab results within the next 2 weeks.  If you have not heard from [place] then please contact us.  The fastest way to get your results is to register for My Chart.   IF you received an x-ray today, you will receive an invoice from [facility]. Please contact [facility] at [phone] with questions or concerns regarding your invoice.   IF you received labwork today, you will receive an invoice from [place]. Please contact [facility] at [phone] with questions or concerns regarding your invoice.   Our billing staff will not be able to assist you with questions regarding bills from these companies.  You will be contacted with the lab results as soon as they are available. The fastest  way to get your results is to activate your My Chart account. Instructions are located on the last page of this paperwork. If you have not heard from [place] regarding the results in 2 weeks, please contact this office.

## 2020-05-06 ENCOUNTER — Encounter: Payer: Self-pay | Admitting: Family Medicine

## 2020-07-10 ENCOUNTER — Ambulatory Visit: Payer: 59 | Admitting: Registered Nurse

## 2020-07-10 ENCOUNTER — Other Ambulatory Visit: Payer: Self-pay

## 2020-07-10 ENCOUNTER — Encounter: Payer: Self-pay | Admitting: Registered Nurse

## 2020-07-10 ENCOUNTER — Ambulatory Visit (INDEPENDENT_AMBULATORY_CARE_PROVIDER_SITE_OTHER): Payer: 59

## 2020-07-10 VITALS — BP 145/82 | HR 81 | Temp 98.2°F | Resp 18 | Ht 63.0 in | Wt 157.2 lb

## 2020-07-10 DIAGNOSIS — M4306 Spondylolysis, lumbar region: Secondary | ICD-10-CM | POA: Diagnosis not present

## 2020-07-10 DIAGNOSIS — M545 Low back pain, unspecified: Secondary | ICD-10-CM

## 2020-07-10 DIAGNOSIS — Z23 Encounter for immunization: Secondary | ICD-10-CM | POA: Diagnosis not present

## 2020-07-10 MED ORDER — METHOCARBAMOL 500 MG PO TABS: 500.0000 mg | ORAL_TABLET | Freq: Four times a day (QID) | ORAL | 0 refills | Status: DC

## 2020-07-10 MED ORDER — PREDNISONE 10 MG PO TABS: ORAL_TABLET | ORAL | 0 refills | Status: AC

## 2020-07-10 NOTE — Progress Notes (Signed)
Acute Office Visit  Subjective:    Patient ID: Rachel Roth, female    DOB: 1966/03/03, 54 y.o.   MRN: [id number]  Chief Complaint  Patient presents with  . Back Pain    patient states for the last 2 weeks she has been having some lower back pain. per patient the pain makes it hard to get up out of the bed or sit for a while. SHe has been taking Meloxicam and tylenol with little relief.    HPI Patient is in today for lower back pain Stiffness when arising after sitting or sleeping is worst of it Does note pain when lifting heavy objects - works at [employer], has to [employer] often, often short staffed. Trouble with staying in one location too long No acute injury No changes to bowel or bladder habits.  No radiation to lower extremities.  Past Medical History:  Diagnosis Date  . Allergy   . Arthritis     Past Surgical History:  Procedure Laterality Date  . ABDOMINAL HYSTERECTOMY    . CESAREAN SECTION      Family History  Problem Relation Age of Onset  . Diabetes Mother   . Cancer Father 22       Lung cancer  . Diabetes Sister   . Diabetes Brother   . Diabetes Sister   . Cancer Sister 37       breast cancer  . Cancer Maternal Aunt 50       breast cancer  . Cancer Maternal Grandmother        breast cancer    Social History   Socioeconomic History  . Marital status: Married    Spouse name: Not on file  . Number of children: Not on file  . Years of education: Not on file  . Highest education level: Not on file  Occupational History  . Not on file  Tobacco Use  . Smoking status: Current Every Day Smoker    Packs/day: 0.50    Types: Cigarettes  . Smokeless tobacco: Never Used  Substance and Sexual Activity  . Alcohol use: No  . Drug use: No  . Sexual activity: Yes    Birth control/protection: None  Other Topics Concern  . Not on file  Social History Narrative  . Not on file   Social Determinants of Health   Financial Resource Strain:    . Difficulty of Paying Living Expenses: Not on file  Food Insecurity:   . Worried About Charity fundraiser in the Last Year: Not on file  . Ran Out of Food in the Last Year: Not on file  Transportation Needs:   . Lack of Transportation (Medical): Not on file  . Lack of Transportation (Non-Medical): Not on file  Physical Activity:   . Days of Exercise per Week: Not on file  . Minutes of Exercise per Session: Not on file  Stress:   . Feeling of Stress : Not on file  Social Connections:   . Frequency of Communication with Friends and Family: Not on file  . Frequency of Social Gatherings with Friends and Family: Not on file  . Attends Religious Services: Not on file  . Active Member of Clubs or Organizations: Not on file  . Attends Archivist Meetings: Not on file  . Marital Status: Not on file  Intimate Partner Violence:   . Fear of Current or Ex-Partner: Not on file  . Emotionally Abused: Not on file  . Physically  Abused: Not on file  . Sexually Abused: Not on file    Outpatient Medications Prior to Visit  Medication Sig Dispense Refill  . amLODipine (NORVASC) 5 MG tablet Take 1 tablet (5 mg total) by mouth daily. 90 tablet 1  . APPLE CIDER VINEGAR PO Take by mouth.    . busPIRone (BUSPAR) 5 MG tablet Take 1 tablet (5 mg total) by mouth 2 (two) times daily as needed. 180 tablet 1  . gabapentin (NEURONTIN) 300 MG capsule TAKE 1 CAPSULE BY MOUTH EVERY DAY 90 capsule 0  . meloxicam (MOBIC) 7.5 MG tablet Take 1 tablet (7.5 mg total) by mouth daily. 90 tablet 0  . triamcinolone cream (KENALOG) 0.5 % APPLY TO AFFECTED AREA TWICE A DAY    . venlafaxine XR (EFFEXOR-XR) 75 MG 24 hr capsule TAKE 3 CAPSULES (225 MG TOTAL) BY MOUTH DAILY WITH BREAKFAST. 270 capsule 2  . tiZANidine (ZANAFLEX) 4 MG tablet Take 1 tablet (4 mg total) by mouth every 8 (eight) hours as needed. (Patient not taking: Reported on 05/01/2020) 30 tablet 0   No facility-administered medications prior to visit.      Allergies  Allergen Reactions  . Codeine Nausea And Vomiting  . Meloxicam Hypertension    Review of Systems Per hpi      Objective:    Physical Exam Vitals and nursing note reviewed.  Constitutional:      Appearance: Normal appearance.  Cardiovascular:     Rate and Rhythm: Normal rate and regular rhythm.  Musculoskeletal:        General: Tenderness present.     Cervical back: Normal.     Thoracic back: Normal.     Lumbar back: Spasms, tenderness and bony tenderness present. No swelling, edema, deformity, signs of trauma or lacerations. Decreased range of motion. Negative right straight leg raise test and negative left straight leg raise test. No scoliosis.       Back:     Comments: Feels somewhat of a bony prominence at L5-S1 area.  Skin:    General: Skin is warm and dry.     Capillary Refill: Capillary refill takes less than 2 seconds.  Neurological:     General: No focal deficit present.     Mental Status: She is alert and oriented to person, place, and time. Mental status is at baseline.  Psychiatric:        Mood and Affect: Mood normal.        Behavior: Behavior normal.        Thought Content: Thought content normal.        Judgment: Judgment normal.     BP (!) 145/82   Pulse 81   Temp 98.2 F (36.8 C) (Temporal)   Resp 18   Ht 5\' 3"  (1.6 m)   Wt 157 lb 3.2 oz (71.3 kg)   SpO2 94%   BMI 27.85 kg/m  Wt Readings from Last 3 Encounters:  07/10/20 157 lb 3.2 oz (71.3 kg)  05/01/20 161 lb (73 kg)  03/20/20 165 lb (74.8 kg)    There are no preventive care reminders to display for this patient.  There are no preventive care reminders to display for this patient.   Lab Results  Component Value Date   TSH 1.580 03/19/2015   Lab Results  Component Value Date   WBC 6.1 01/13/2019   HGB 13.8 01/13/2019   HCT 40.5 01/13/2019   MCV 82 01/13/2019   PLT 326 01/13/2019   Lab Results  Component Value Date   NA 144 03/20/2020   K 3.8 03/20/2020    CO2 26 03/20/2020   GLUCOSE 104 (H) 03/20/2020   BUN 14 03/20/2020   CREATININE 0.62 03/20/2020   BILITOT 0.2 01/13/2019   ALKPHOS 122 (H) 01/13/2019   AST 15 01/13/2019   ALT 11 01/13/2019   PROT 6.3 01/13/2019   ALBUMIN 4.1 01/13/2019   CALCIUM 9.4 03/20/2020   Lab Results  Component Value Date   CHOL 196 01/13/2019   Lab Results  Component Value Date   HDL 52 01/13/2019   Lab Results  Component Value Date   LDLCALC 115 (H) 01/13/2019   Lab Results  Component Value Date   TRIG 145 01/13/2019   Lab Results  Component Value Date   CHOLHDL 3.8 01/13/2019   Lab Results  Component Value Date   HGBA1C 5.7 (H) 01/13/2019       Assessment & Plan:   Problem List Items Addressed This Visit      Musculoskeletal and Integument   Pars defect of lumbar spine     Other   Lower back pain - Primary   Relevant Medications   predniSONE (DELTASONE) 10 MG tablet   methocarbamol (ROBAXIN) 500 MG tablet   Other Relevant Orders   DG Lumbar Spine Complete (Completed)    Other Visit Diagnoses    Flu vaccine need       Relevant Orders   Flu Vaccine QUAD 6+ mos PF IM (Fluarix Quad PF) (Completed)       Meds ordered this encounter  Medications  . predniSONE (DELTASONE) 10 MG tablet    Sig: Take 4 tablets (40 mg total) by mouth daily with breakfast for 1 day, THEN 3 tablets (30 mg total) daily with breakfast for 2 days, THEN 2 tablets (20 mg total) daily with breakfast for 2 days, THEN 1 tablet (10 mg total) daily with breakfast for 2 days.    Dispense:  16 tablet    Refill:  0    Order Specific Question:   Supervising Provider    Answer:   Carlota Raspberry, JEFFREY R [[id number]]  . methocarbamol (ROBAXIN) 500 MG tablet    Sig: Take 1 tablet (500 mg total) by mouth 4 (four) times daily.    Dispense:  60 tablet    Refill:  0    Order Specific Question:   Supervising Provider    Answer:   Carlota Raspberry, JEFFREY R [[id number]]   PLAN  Bony tenderness prompted my concern for DG Lumbar spine -  results show Pars deformity and anterolisthesis  Will refer to neuro surg  Quick prednisone taper and robaxin for pain relief  Suggest avoiding heavy lifting, extra breaks at work as needed  Patient encouraged to call clinic with any questions, comments, or concerns.   Maximiano Coss, NP

## 2020-07-10 NOTE — Patient Instructions (Signed)

## 2020-07-25 LAB — HM MAMMOGRAPHY

## 2020-07-31 ENCOUNTER — Other Ambulatory Visit: Payer: Self-pay

## 2020-07-31 ENCOUNTER — Encounter: Payer: Self-pay | Admitting: Family Medicine

## 2020-07-31 ENCOUNTER — Ambulatory Visit: Payer: 59 | Admitting: Family Medicine

## 2020-07-31 VITALS — BP 130/77 | HR 85 | Temp 98.2°F | Ht 63.0 in | Wt 158.2 lb

## 2020-07-31 DIAGNOSIS — M542 Cervicalgia: Secondary | ICD-10-CM

## 2020-07-31 DIAGNOSIS — F419 Anxiety disorder, unspecified: Secondary | ICD-10-CM

## 2020-07-31 DIAGNOSIS — I10 Essential (primary) hypertension: Secondary | ICD-10-CM

## 2020-07-31 DIAGNOSIS — G2581 Restless legs syndrome: Secondary | ICD-10-CM

## 2020-07-31 DIAGNOSIS — M25511 Pain in right shoulder: Secondary | ICD-10-CM | POA: Diagnosis not present

## 2020-07-31 DIAGNOSIS — M19011 Primary osteoarthritis, right shoulder: Secondary | ICD-10-CM | POA: Diagnosis not present

## 2020-07-31 DIAGNOSIS — Z13220 Encounter for screening for lipoid disorders: Secondary | ICD-10-CM

## 2020-07-31 DIAGNOSIS — F32A Depression, unspecified: Secondary | ICD-10-CM

## 2020-07-31 DIAGNOSIS — F3341 Major depressive disorder, recurrent, in partial remission: Secondary | ICD-10-CM

## 2020-07-31 DIAGNOSIS — M1990 Unspecified osteoarthritis, unspecified site: Secondary | ICD-10-CM

## 2020-07-31 MED ORDER — AMLODIPINE BESYLATE 5 MG PO TABS: 5.0000 mg | ORAL_TABLET | Freq: Every day | ORAL | 1 refills | Status: DC

## 2020-07-31 MED ORDER — MELOXICAM 7.5 MG PO TABS: 7.5000 mg | ORAL_TABLET | Freq: Every day | ORAL | 0 refills | Status: DC

## 2020-07-31 MED ORDER — BUSPIRONE HCL 5 MG PO TABS: 5.0000 mg | ORAL_TABLET | Freq: Two times a day (BID) | ORAL | 1 refills | Status: DC | PRN

## 2020-07-31 MED ORDER — GABAPENTIN 300 MG PO CAPS: ORAL_CAPSULE | ORAL | 1 refills | Status: DC

## 2020-07-31 NOTE — Patient Instructions (Addendum)
  Lab only visit in 1 month - fasting at that time.  No med changes today.    If you have lab work done today you will be contacted with your lab results within the next 2 weeks.  If you have not heard from [place] then please contact us. The fastest way to get your results is to register for My Chart.   IF you received an x-ray today, you will receive an invoice from [facility]. Please contact [facility] at [phone] with questions or concerns regarding your invoice.   IF you received labwork today, you will receive an invoice from [place]. Please contact [facility] at [phone] with questions or concerns regarding your invoice.   Our billing staff will not be able to assist you with questions regarding bills from these companies.  You will be contacted with the lab results as soon as they are available. The fastest way to get your results is to activate your My Chart account. Instructions are located on the last page of this paperwork. If you have not heard from [place] regarding the results in 2 weeks, please contact this office.

## 2020-07-31 NOTE — Progress Notes (Signed)
Subjective:  Patient ID: Rachel Roth, female    DOB: 04-17-1966  Age: 54 y.o. MRN: [id number]  CC:  Chief Complaint  Patient presents with  . Medical Management of Chronic Issues    3 m f/u   . Shoulder Pain    f/u    HPI Rachel Roth presents for   Anxiety: Started on BuSpar June 23.  Was continued on Effexor, gabapentin, and Flomax provided for counseling.  Improved at August visit on BuSpar 5 mg twice daily.  Felt better, did not follow-up with counseling.  Gabapentin was working for RLS. Anxiety is stable with effexor and buspar current doses - buspar has helped a lot.  Depression screen [facility] 2/9 07/31/2020 07/10/2020 05/01/2020 03/20/2020 03/06/2019  Decreased Interest 0 0 0 0 0  Down, Depressed, Hopeless 0 0 0 0 0  PHQ - 2 Score 0 0 0 0 0  Altered sleeping - - - - -  Tired, decreased energy - - - - -  Change in appetite - - - - -  Feeling bad or failure about yourself  - - - - -  Trouble concentrating - - - - -  Moving slowly or fidgety/restless - - - - -  Suicidal thoughts - - - - -  PHQ-9 Score - - - - -  Difficult doing work/chores - - - - -  Some recent data might be hidden     Right shoulder pain Discussed in August visit.  Noted after amusement park ride in July, sore in her right arm the next day.  C-spine x-ray in 2019 with some C4-5 and C5-6 disc degeneration. DDD with disc height loss at C4-5 on 05/01/20 XR. R shoulder - mild degen changes of AC. Continue on meloxicam, tizanidine if needed.  Option of orthopedic eval if persistent. Pain comes and goes with certain movement. Neck is doing ok, soreness in front at times with certain movements - few days a week - brief discomfort. Taking mobic qd, not needing mm relaxer.   Low back pain Evaluated October 13.  Pars defect with anterolisthesis noted on lumbar spine x-ray.  Prednisone taper and Robaxin prescribed and referred to neurosurgery, Dr. Annette Stable.  Saw Dr. Annette Stable - [facility] yesterday, appt for follow up  tomorrow.   Hypertension: Amlodipine 5 mg daily.  Renal function normal in June. Home readings: BP Readings from Last 3 Encounters:  07/31/20 130/77  07/10/20 (!) 145/82  05/01/20 125/78   Lab Results  Component Value Date   CREATININE 0.62 03/20/2020   Hyperlipidemia: Borderline LDL in 2020 - no meds  Not fasting today.  Lab Results  Component Value Date   CHOL 196 01/13/2019   HDL 52 01/13/2019   LDLCALC 115 (H) 01/13/2019   TRIG 145 01/13/2019   CHOLHDL 3.8 01/13/2019   Lab Results  Component Value Date   ALT 11 01/13/2019   AST 15 01/13/2019   ALKPHOS 122 (H) 01/13/2019   BILITOT 0.2 01/13/2019       History Patient Active Problem List   Diagnosis Date Noted  . 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  . Recurrent major depressive disorder, in partial remission ([place]) 04/12/2018  . Arthritis 10/25/2016  . RLS (restless legs syndrome) 03/19/2015  . Lower back pain 07/09/2012  . Hip pain 07/09/2012  . Cervical radiculopathy 05/29/2012  . Anxiety 05/29/2012  . Insomnia 05/29/2012  . BMI 31.0-31.9,adult 05/29/2012  . Depression 12/17/2011   Past Medical History:  Diagnosis Date  .  Allergy   . Arthritis    Past Surgical History:  Procedure Laterality Date  . ABDOMINAL HYSTERECTOMY    . CESAREAN SECTION     Allergies  Allergen Reactions  . Codeine Nausea And Vomiting   Prior to Admission medications   Medication Sig Start Date End Date Taking? Authorizing Provider  amLODipine (NORVASC) 5 MG tablet Take 1 tablet (5 mg total) by mouth daily. 03/20/20  Yes Wendie Agreste, MD  busPIRone (BUSPAR) 5 MG tablet Take 1 tablet (5 mg total) by mouth 2 (two) times daily as needed. 03/20/20  Yes Wendie Agreste, MD  gabapentin (NEURONTIN) 300 MG capsule TAKE 1 CAPSULE BY MOUTH EVERY DAY 05/01/20  Yes Wendie Agreste, MD  meloxicam (MOBIC) 7.5 MG tablet Take 1 tablet (7.5 mg total) by mouth daily. 05/01/20  Yes Wendie Agreste, MD  methocarbamol (ROBAXIN) 500 MG  tablet Take 1 tablet (500 mg total) by mouth 4 (four) times daily. 07/10/20  Yes Maximiano Coss, NP  venlafaxine XR (EFFEXOR-XR) 75 MG 24 hr capsule TAKE 3 CAPSULES (225 MG TOTAL) BY MOUTH DAILY WITH BREAKFAST. 05/01/20  Yes Wendie Agreste, MD  tiZANidine (ZANAFLEX) 4 MG tablet Take 1 tablet (4 mg total) by mouth every 8 (eight) hours as needed. Patient not taking: Reported on 05/01/2020 04/13/20   Jaynee Eagles, PA-C   Social History   Socioeconomic History  . Marital status: Married    Spouse name: Not on file  . Number of children: Not on file  . Years of education: Not on file  . Highest education level: Not on file  Occupational History  . Not on file  Tobacco Use  . Smoking status: Current Every Day Smoker    Packs/day: 0.50    Types: Cigarettes  . Smokeless tobacco: Never Used  Substance and Sexual Activity  . Alcohol use: No  . Drug use: No  . Sexual activity: Yes    Birth control/protection: None  Other Topics Concern  . Not on file  Social History Narrative  . Not on file   Social Determinants of Health   Financial Resource Strain:   . Difficulty of Paying Living Expenses: Not on file  Food Insecurity:   . Worried About Charity fundraiser in the Last Year: Not on file  . Ran Out of Food in the Last Year: Not on file  Transportation Needs:   . Lack of Transportation (Medical): Not on file  . Lack of Transportation (Non-Medical): Not on file  Physical Activity:   . Days of Exercise per Week: Not on file  . Minutes of Exercise per Session: Not on file  Stress:   . Feeling of Stress : Not on file  Social Connections:   . Frequency of Communication with Friends and Family: Not on file  . Frequency of Social Gatherings with Friends and Family: Not on file  . Attends Religious Services: Not on file  . Active Member of Clubs or Organizations: Not on file  . Attends Archivist Meetings: Not on file  . Marital Status: Not on file  Intimate Partner Violence:    . Fear of Current or Ex-Partner: Not on file  . Emotionally Abused: Not on file  . Physically Abused: Not on file  . Sexually Abused: Not on file    Review of Systems  Constitutional: Negative for fatigue and unexpected weight change.  Respiratory: Negative for chest tightness and shortness of breath.   Cardiovascular: Negative for chest  pain, palpitations and leg swelling.  Gastrointestinal: Negative for abdominal pain and blood in stool.  Neurological: Negative for dizziness, syncope, light-headedness and headaches.    As in HPI.    Objective:   Vitals:   07/31/20 0945  BP: 130/77  Pulse: 85  Temp: 98.2 F (36.8 C)  TempSrc: Temporal  SpO2: 97%  Weight: 158 lb 3.2 oz (71.8 kg)  Height: 5\' 3"  (1.6 m)     Physical Exam Vitals reviewed.  Constitutional:      Appearance: She is well-developed.  HENT:     Head: Normocephalic and atraumatic.  Eyes:     Conjunctiva/sclera: Conjunctivae normal.     Pupils: Pupils are equal, round, and reactive to light.  Neck:     Vascular: No carotid bruit.  Cardiovascular:     Rate and Rhythm: Normal rate and regular rhythm.     Heart sounds: Normal heart sounds.  Pulmonary:     Effort: Pulmonary effort is normal.     Breath sounds: Normal breath sounds.  Abdominal:     Palpations: Abdomen is soft. There is no pulsatile mass.     Tenderness: There is no abdominal tenderness.  Musculoskeletal:     Comments: Right shoulder Slight tenderness palpation over the [facility] joint.  No crepitus.  Full range of motion.  Full rotator cuff strength.  Negative empty can.  Negative Neer/Hawkins.  Positive crossover.  Skin:    General: Skin is warm and dry.  Neurological:     Mental Status: She is alert and oriented to person, place, and time.  Psychiatric:        Behavior: Behavior normal.     Assessment & Plan:  Rachel Roth is a 54 y.o. female . Anxiety and depression - Plan: busPIRone (BUSPAR) 5 MG tablet Recurrent major  depressive disorder, in partial remission ([place]) - Plan: busPIRone (BUSPAR) 5 MG tablet  -  Stable, tolerating current regimen. Medications refilled. 6 month recheck.   Arthritis of right acromioclavicular joint Pain in joint of right shoulder  -Suspected AC arthritis as primary cause, could have component of rotator cuff tendinosis as well.  Consider orthopedic eval if more persistent, meloxicam if needed for now.-Side effects have been discussed.  Essential hypertension - Plan: Comprehensive metabolic panel, amLODipine (NORVASC) 5 MG tablet  -Stable, continue same regimen  Screening for hyperlipidemia - Plan: Comprehensive metabolic panel, Lipid panel  RLS (restless legs syndrome) - Plan: gabapentin (NEURONTIN) 300 MG capsule  -Stable, continue gabapentin.  Arthritis - Plan: meloxicam (MOBIC) 7.5 MG tablet Neck pain on right side - Plan: meloxicam (MOBIC) 7.5 MG tablet  -As above, continue with meloxicam for now.  Follow-up with neurosurgery regarding back pain.  MRI performed yesterday.  31 minutes spent during visit, greater than 50% counseling and assimilation of information, chart review, and discussion of plan.    Meds ordered this encounter  Medications  . gabapentin (NEURONTIN) 300 MG capsule    Sig: TAKE 1 CAPSULE BY MOUTH EVERY DAY    Dispense:  90 capsule    Refill:  1  . busPIRone (BUSPAR) 5 MG tablet    Sig: Take 1 tablet (5 mg total) by mouth 2 (two) times daily as needed.    Dispense:  180 tablet    Refill:  1  . amLODipine (NORVASC) 5 MG tablet    Sig: Take 1 tablet (5 mg total) by mouth daily.    Dispense:  90 tablet    Refill:  1  .  meloxicam (MOBIC) 7.5 MG tablet    Sig: Take 1 tablet (7.5 mg total) by mouth daily.    Dispense:  90 tablet    Refill:  0   Patient Instructions    Lab only visit in 1 month - fasting at that time.  No med changes today.    If you have lab work done today you will be contacted with your lab results within the next 2  weeks.  If you have not heard from [place] then please contact us. The fastest way to get your results is to register for My Chart.   IF you received an x-ray today, you will receive an invoice from [facility]. Please contact [facility] at [phone] with questions or concerns regarding your invoice.   IF you received labwork today, you will receive an invoice from [place]. Please contact [facility] at [phone] with questions or concerns regarding your invoice.   Our billing staff will not be able to assist you with questions regarding bills from these companies.  You will be contacted with the lab results as soon as they are available. The fastest way to get your results is to activate your My Chart account. Instructions are located on the last page of this paperwork. If you have not heard from [place] regarding the results in 2 weeks, please contact this office.          Signed, Merri Ray, MD [facility]

## 2020-08-30 ENCOUNTER — Ambulatory Visit: Payer: 59

## 2020-11-06 ENCOUNTER — Other Ambulatory Visit: Payer: Self-pay | Admitting: Family Medicine

## 2020-11-06 DIAGNOSIS — M542 Cervicalgia: Secondary | ICD-10-CM

## 2020-11-06 DIAGNOSIS — M1990 Unspecified osteoarthritis, unspecified site: Secondary | ICD-10-CM

## 2020-11-06 NOTE — Telephone Encounter (Signed)
Requested Prescriptions  Pending Prescriptions Disp Refills  . meloxicam (MOBIC) 7.5 MG tablet [Pharmacy Med Name: MELOXICAM 7.5 MG TABLET] 90 tablet 0    Sig: TAKE 1 TABLET BY MOUTH EVERY DAY     Analgesics:  COX2 Inhibitors Failed - 11/06/2020  1:45 AM      Failed - HGB in normal range and within 360 days    Hemoglobin  Date Value Ref Range Status  01/13/2019 13.8 11.1 - 15.9 g/dL Final         Passed - Cr in normal range and within 360 days    Creat  Date Value Ref Range Status  03/19/2015 0.60 0.50 - 1.10 mg/dL Final   Creatinine, Ser  Date Value Ref Range Status  03/20/2020 0.62 0.57 - 1.00 mg/dL Final         Passed - Patient is not pregnant      Passed - Valid encounter within last 12 months    Recent Outpatient Visits          3 months ago Anxiety and depression   [facility], Ranell Patrick, MD   3 months ago Acute midline low back pain without sciatica   [facility], [place], NP   6 months ago RLS (restless legs syndrome)   [facility] [place], MD   7 months ago Anxiety and depression   [facility], Ranell Patrick, MD   1 year ago Breast pain, right   [facility] at [facility], Arlie Solomons, MD      Future Appointments            In 2 months Rachel Roth Ranell Patrick, MD [facility] at [place], [facility]

## 2020-12-19 ENCOUNTER — Other Ambulatory Visit: Payer: Self-pay | Admitting: Registered Nurse

## 2020-12-19 DIAGNOSIS — M545 Low back pain, unspecified: Secondary | ICD-10-CM

## 2020-12-19 NOTE — Telephone Encounter (Signed)
Requested medications are due for refill today yes  Requested medications are on the active medication list yes  Last refill 07/10/20  Last visit 07/2020  Future visit scheduled 01/2021  Notes to clinic Not Delegated.

## 2020-12-19 NOTE — Telephone Encounter (Signed)
Requested medications are due for refill today yes   Requested medications are on the active medication list yes   Last refill 07/10/20   Last visit 07/2020   Future visit scheduled 01/2021   Notes to clinic Not Delegated.

## 2021-01-11 ENCOUNTER — Other Ambulatory Visit: Payer: Self-pay | Admitting: Family Medicine

## 2021-01-11 DIAGNOSIS — F3341 Major depressive disorder, recurrent, in partial remission: Secondary | ICD-10-CM

## 2021-01-11 DIAGNOSIS — F32A Depression, unspecified: Secondary | ICD-10-CM

## 2021-01-11 DIAGNOSIS — F419 Anxiety disorder, unspecified: Secondary | ICD-10-CM

## 2021-01-11 NOTE — Telephone Encounter (Signed)
   Notes to clinic Not a provider we approve rx for.  

## 2021-01-14 ENCOUNTER — Other Ambulatory Visit: Payer: Self-pay | Admitting: Family Medicine

## 2021-01-14 DIAGNOSIS — M542 Cervicalgia: Secondary | ICD-10-CM

## 2021-01-14 DIAGNOSIS — M1990 Unspecified osteoarthritis, unspecified site: Secondary | ICD-10-CM

## 2021-01-29 ENCOUNTER — Ambulatory Visit: Payer: 59 | Admitting: Family Medicine

## 2021-01-29 ENCOUNTER — Other Ambulatory Visit: Payer: Self-pay

## 2021-01-29 ENCOUNTER — Encounter: Payer: Self-pay | Admitting: Family Medicine

## 2021-01-29 VITALS — BP 128/80 | HR 77 | Temp 98.1°F | Ht 63.0 in | Wt 163.5 lb

## 2021-01-29 DIAGNOSIS — F32A Depression, unspecified: Secondary | ICD-10-CM

## 2021-01-29 DIAGNOSIS — I10 Essential (primary) hypertension: Secondary | ICD-10-CM

## 2021-01-29 DIAGNOSIS — R5383 Other fatigue: Secondary | ICD-10-CM | POA: Diagnosis not present

## 2021-01-29 DIAGNOSIS — F3341 Major depressive disorder, recurrent, in partial remission: Secondary | ICD-10-CM

## 2021-01-29 DIAGNOSIS — E785 Hyperlipidemia, unspecified: Secondary | ICD-10-CM | POA: Diagnosis not present

## 2021-01-29 DIAGNOSIS — G2581 Restless legs syndrome: Secondary | ICD-10-CM | POA: Diagnosis not present

## 2021-01-29 DIAGNOSIS — F419 Anxiety disorder, unspecified: Secondary | ICD-10-CM

## 2021-01-29 LAB — TSH: TSH: 1.67 u[IU]/mL (ref 0.35–4.50)

## 2021-01-29 LAB — COMPREHENSIVE METABOLIC PANEL
ALT: 12 U/L (ref 0–35)
AST: 13 U/L (ref 0–37)
Albumin: 4.2 g/dL (ref 3.5–5.2)
Alkaline Phosphatase: 113 U/L (ref 39–117)
BUN: 13 mg/dL (ref 6–23)
CO2: 29 mEq/L (ref 19–32)
Calcium: 8.9 mg/dL (ref 8.4–10.5)
Chloride: 105 mEq/L (ref 96–112)
Creatinine, Ser: 0.58 mg/dL (ref 0.40–1.20)
GFR: 102.77 mL/min (ref >60.00)
Glucose, Bld: 85 mg/dL (ref 70–99)
Potassium: 3.8 mEq/L (ref 3.5–5.1)
Sodium: 141 mEq/L (ref 135–145)
Total Bilirubin: 0.5 mg/dL (ref 0.2–1.2)
Total Protein: 6.3 g/dL (ref 6.0–8.3)

## 2021-01-29 LAB — CBC
HCT: 42 % (ref 36.0–46.0)
Hemoglobin: 14.3 g/dL (ref 12.0–15.0)
MCHC: 34 g/dL (ref 30.0–36.0)
MCV: 84.5 fl (ref 78.0–100.0)
Platelets: 303 10*3/uL (ref 150.0–400.0)
RBC: 4.97 Mil/uL (ref 3.87–5.11)
RDW: 13.9 % (ref 11.5–15.5)
WBC: 7.1 10*3/uL (ref 4.0–10.5)

## 2021-01-29 LAB — FERRITIN: Ferritin: 29 ng/mL (ref 10.0–291.0)

## 2021-01-29 LAB — LIPID PANEL
Cholesterol: 199 mg/dL (ref 0–200)
HDL: 48 mg/dL (ref >39.00)
LDL Cholesterol: 120 mg/dL — ABNORMAL HIGH (ref 0–99)
NonHDL: 151.26
Total CHOL/HDL Ratio: 4
Triglycerides: 157 mg/dL — ABNORMAL HIGH (ref 0.0–149.0)
VLDL: 31.4 mg/dL (ref 0.0–40.0)

## 2021-01-29 MED ORDER — BUSPIRONE HCL 5 MG PO TABS: 5.0000 mg | ORAL_TABLET | Freq: Two times a day (BID) | ORAL | 1 refills | Status: DC | PRN

## 2021-01-29 MED ORDER — GABAPENTIN 300 MG PO CAPS: 300.0000 mg | ORAL_CAPSULE | Freq: Every day | ORAL | 1 refills | Status: DC

## 2021-01-29 MED ORDER — VENLAFAXINE HCL ER 75 MG PO CP24: 225.0000 mg | ORAL_CAPSULE | Freq: Every day | ORAL | 3 refills | Status: DC

## 2021-01-29 MED ORDER — AMLODIPINE BESYLATE 5 MG PO TABS: 5.0000 mg | ORAL_TABLET | Freq: Every day | ORAL | 1 refills | Status: DC

## 2021-01-29 NOTE — Patient Instructions (Addendum)
If ferritin lower than 75, it may be worth trying over the counter iron supplement once per day for restless leg syndrome. Continue gabapentin, but if needed can take additional gabapentin at night. Watch for side effects at higher dosing and let me know how often you need second dose.  Return to the clinic or go to the nearest emergency room if any of your symptoms worsen or new symptoms occur.  No other med changes today.

## 2021-01-29 NOTE — Progress Notes (Signed)
Subjective:  Patient ID: Rachel Roth, female    DOB: 1966-08-03  Age: 55 y.o. MRN: [id number]  CC:  Chief Complaint  Patient presents with  . Anxiety  . Depression    HPI Rachel Roth presents for   Depression with anxiety. See previous notes, last discussed in November.  Had been started on BuSpar in June of last year, continued on Effexor 225mg , gabapentin.  Counseling has been recommended.  Gabapentin for restless leg syndromes.  BuSpar was helpful at her November visit and continue on same regimen.  Still feels like Buspar is helping - 5mg  BID along with Effexor. Current dose working well. Some life stressors. Feels like handling ok. No therapy, declines at this time.  Would like to stay on same meds.  Has taken gabapentin for RLS. Notices anytime. constantly moving legs. Helps with taking meds at night, but sometimes not working. Sometimes still feels like still need to move legs.  Ferritin 26 in 2018. Tired at times. No melena or hematochezia. Trouble getting to sleep at times with leg mvmts.    Lab Results  Component Value Date   WBC 6.1 01/13/2019   HGB 13.8 01/13/2019   HCT 40.5 01/13/2019   MCV 82 01/13/2019   PLT 326 01/13/2019   Lab Results  Component Value Date   TSH 1.580 03/19/2015     GAD 7 : Generalized Anxiety Score 01/29/2021  Nervous, Anxious, on Edge 1  Control/stop worrying 3  Worry too much - different things 2  Trouble relaxing 0  Restless 3  Easily annoyed or irritable 1  Afraid - awful might happen 0  Total GAD 7 Score 10     Depression screen [facility] 2/9 01/29/2021 07/31/2020 07/10/2020 05/01/2020 03/20/2020  Decreased Interest 1 0 0 0 0  Down, Depressed, Hopeless 0 0 0 0 0  PHQ - 2 Score 1 0 0 0 0  Altered sleeping 3 - - - -  Tired, decreased energy 3 - - - -  Change in appetite 0 - - - -  Feeling bad or failure about yourself  0 - - - -  Trouble concentrating 0 - - - -  Moving slowly or fidgety/restless 0 - - - -  Suicidal  thoughts 0 - - - -  PHQ-9 Score 7 - - - -  Difficult doing work/chores Somewhat difficult - - - -  Some recent data might be hidden   Hypertension: Amlodipine 5 mg daily Home readings: BP Readings from Last 3 Encounters:  01/29/21 128/80  07/31/20 130/77  07/10/20 (!) 145/82   Lab Results  Component Value Date   CREATININE 0.62 03/20/2020   Hyperlipidemia: Borderline LDL previously, no meds.  Exercise: always active. No regular exercise regimen.  The 10-year ASCVD risk score Mikey Bussing DC Brooke Bonito., et al., 2013) is: 6.3%   Values used to calculate the score:     Age: 40 years     Sex: Female     Is Non-Hispanic African American: No     Diabetic: No     Tobacco smoker: Yes     Systolic Blood Pressure: 606 mmHg     Is BP treated: Yes     HDL Cholesterol: 52 mg/dL     Total Cholesterol: 196 mg/dL  Lab Results  Component Value Date   CHOL 196 01/13/2019   HDL 52 01/13/2019   LDLCALC 115 (H) 01/13/2019   TRIG 145 01/13/2019   CHOLHDL 3.8 01/13/2019   Lab Results  Component Value Date   ALT 11 01/13/2019   AST 15 01/13/2019   ALKPHOS 122 (H) 01/13/2019   BILITOT 0.2 01/13/2019       History Patient Active Problem List   Diagnosis Date Noted  . 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  . Recurrent major depressive disorder, in partial remission ([place]) 04/12/2018  . Arthritis 10/25/2016  . RLS (restless legs syndrome) 03/19/2015  . Lower back pain 07/09/2012  . Hip pain 07/09/2012  . Cervical radiculopathy 05/29/2012  . Anxiety 05/29/2012  . Insomnia 05/29/2012  . BMI 31.0-31.9,adult 05/29/2012  . Depression 12/17/2011   Past Medical History:  Diagnosis Date  . Allergy   . Arthritis    Past Surgical History:  Procedure Laterality Date  . ABDOMINAL HYSTERECTOMY    . CESAREAN SECTION     Allergies  Allergen Reactions  . Codeine Nausea And Vomiting   Prior to Admission medications   Medication Sig Start Date End Date Taking? Authorizing Provider  amLODipine  (NORVASC) 5 MG tablet Take 1 tablet (5 mg total) by mouth daily. 07/31/20  Yes Wendie Agreste, MD  busPIRone (BUSPAR) 5 MG tablet Take 1 tablet (5 mg total) by mouth 2 (two) times daily as needed. 07/31/20  Yes Wendie Agreste, MD  gabapentin (NEURONTIN) 300 MG capsule TAKE 1 CAPSULE BY MOUTH EVERY DAY 07/31/20  Yes Wendie Agreste, MD  meloxicam (MOBIC) 7.5 MG tablet TAKE 1 TABLET BY MOUTH EVERY DAY 11/06/20  Yes Wendie Agreste, MD  venlafaxine XR (EFFEXOR-XR) 75 MG 24 hr capsule TAKE 3 CAPSULES (225 MG TOTAL) BY MOUTH DAILY WITH BREAKFAST. 01/21/21  Yes Wendie Agreste, MD  methocarbamol (ROBAXIN) 500 MG tablet TAKE 1 TABLET BY MOUTH 4 TIMES DAILY. Patient not taking: Reported on 01/29/2021 12/20/20   Wendie Agreste, MD   Social History   Socioeconomic History  . Marital status: Married    Spouse name: Not on file  . Number of children: Not on file  . Years of education: Not on file  . Highest education level: Not on file  Occupational History  . Not on file  Tobacco Use  . Smoking status: Current Every Day Smoker    Packs/day: 0.50    Types: Cigarettes  . Smokeless tobacco: Never Used  Substance and Sexual Activity  . Alcohol use: No  . Drug use: No  . Sexual activity: Yes    Birth control/protection: None  Other Topics Concern  . Not on file  Social History Narrative  . Not on file   Social Determinants of Health   Financial Resource Strain: Not on file  Food Insecurity: Not on file  Transportation Needs: Not on file  Physical Activity: Not on file  Stress: Not on file  Social Connections: Not on file  Intimate Partner Violence: Not on file    Review of Systems  Constitutional: Positive for fatigue (episodic - as above. ). Negative for unexpected weight change.  Respiratory: Negative for chest tightness and shortness of breath.   Cardiovascular: Negative for chest pain, palpitations and leg swelling.  Gastrointestinal: Negative for abdominal pain and blood in  stool.  Neurological: Negative for dizziness, syncope, light-headedness and headaches.     Objective:   Vitals:   01/29/21 0937  BP: 128/80  Pulse: 77  Temp: 98.1 F (36.7 C)  SpO2: 97%  Weight: 163 lb 8 oz (74.2 kg)  Height: 5\' 3"  (1.6 m)     Physical Exam Vitals reviewed.  Constitutional:  Appearance: She is well-developed.  HENT:     Head: Normocephalic and atraumatic.  Eyes:     Conjunctiva/sclera: Conjunctivae normal.     Pupils: Pupils are equal, round, and reactive to light.  Neck:     Vascular: No carotid bruit.  Cardiovascular:     Rate and Rhythm: Normal rate and regular rhythm.     Heart sounds: Normal heart sounds.  Pulmonary:     Effort: Pulmonary effort is normal.     Breath sounds: Normal breath sounds.  Abdominal:     Palpations: Abdomen is soft. There is no pulsatile mass.     Tenderness: There is no abdominal tenderness.  Skin:    General: Skin is warm and dry.  Neurological:     Mental Status: She is alert and oriented to person, place, and time.  Psychiatric:        Behavior: Behavior normal.        Assessment & Plan:  Rachel Roth is a 55 y.o. female . RLS (restless legs syndrome) - Plan: Ferritin, gabapentin (NEURONTIN) 300 MG capsule  -Increased control, will check ferritin, CBC.  If ferritin less than 75, can try over-the-counter iron supplement once per day.  Option of second dose of gabapentin if needed at night with potential side effects of meds discussed.  Essential hypertension - Plan: amLODipine (NORVASC) 5 MG tablet  -Stable, continue amlodipine, check labs  Anxiety and depression - Plan: venlafaxine XR (EFFEXOR-XR) 75 MG 24 hr capsule, busPIRone (BUSPAR) 5 MG tablet  -Reports overall stable symptoms, continue BuSpar, Effexor same doses.  Tolerating combination. Recurrent major depressive disorder, in partial remission ([place]) - Plan: venlafaxine XR (EFFEXOR-XR) 75 MG 24 hr capsule, busPIRone (BUSPAR) 5 MG  tablet  Fatigue, unspecified type - Plan: CBC, TSH  -Likely related to decreased sleep with RLS.  Check CBC, TSH, RTC precautions.  Hyperlipidemia, unspecified hyperlipidemia type - Plan: Lipid panel, Comprehensive metabolic panel  -Updated labs ordered, previous levels not at ASCVD risk score to indicate need for statin.    Meds ordered this encounter  Medications  . gabapentin (NEURONTIN) 300 MG capsule    Sig: Take 1-2 capsules (300-600 mg total) by mouth at bedtime.    Dispense:  135 capsule    Refill:  1  . venlafaxine XR (EFFEXOR-XR) 75 MG 24 hr capsule    Sig: Take 3 capsules (225 mg total) by mouth daily with breakfast.    Dispense:  90 capsule    Refill:  3  . busPIRone (BUSPAR) 5 MG tablet    Sig: Take 1 tablet (5 mg total) by mouth 2 (two) times daily as needed.    Dispense:  180 tablet    Refill:  1  . amLODipine (NORVASC) 5 MG tablet    Sig: Take 1 tablet (5 mg total) by mouth daily.    Dispense:  90 tablet    Refill:  1   Patient Instructions  If ferritin lower than 75, it may be worth trying over the counter iron supplement once per day for restless leg syndrome. Continue gabapentin, but if needed can take additional gabapentin at night. Watch for side effects at higher dosing and let me know how often you need second dose.  Return to the clinic or go to the nearest emergency room if any of your symptoms worsen or new symptoms occur.  No other med changes today.       Signed, Merri Ray, MD [facility]

## 2021-04-07 ENCOUNTER — Other Ambulatory Visit: Payer: Self-pay | Admitting: Family Medicine

## 2021-04-07 DIAGNOSIS — M1990 Unspecified osteoarthritis, unspecified site: Secondary | ICD-10-CM

## 2021-04-07 DIAGNOSIS — M542 Cervicalgia: Secondary | ICD-10-CM

## 2021-04-07 NOTE — Telephone Encounter (Signed)
Last filled 11/06/20 #90 with no refills Last visit 01/29/21 Next visit none

## 2021-04-08 NOTE — Telephone Encounter (Signed)
Last discussed in November.  Please clarify use and reason.  Is she taking Mobic every day, and for what area?

## 2021-04-08 NOTE — Telephone Encounter (Signed)
Last filled 11/06/20 #90 with no refills Last visit 01/29/54 Next visit none

## 2021-04-09 NOTE — Telephone Encounter (Signed)
Last filled 11/06/20 #90 with no refills Last visit  01/29/21 Next visit none

## 2021-04-11 NOTE — Telephone Encounter (Signed)
Patient states she was taking it daily for hips and arthritis pain. She states she has not taken it in about 3 weeks now. Patient states she can take tylenol if needed.

## 2021-04-12 NOTE — Telephone Encounter (Signed)
Refilled if needed, message sent to try tylenol first and recheck to discuss further if daily use needed.

## 2021-06-08 ENCOUNTER — Other Ambulatory Visit: Payer: Self-pay | Admitting: Family Medicine

## 2021-06-08 DIAGNOSIS — M1990 Unspecified osteoarthritis, unspecified site: Secondary | ICD-10-CM

## 2021-06-08 DIAGNOSIS — M542 Cervicalgia: Secondary | ICD-10-CM

## 2021-06-25 ENCOUNTER — Other Ambulatory Visit: Payer: Self-pay | Admitting: Family Medicine

## 2021-06-25 DIAGNOSIS — G2581 Restless legs syndrome: Secondary | ICD-10-CM

## 2021-06-25 NOTE — Telephone Encounter (Signed)
Called patient and left message for patient to return call because she needs a follow up aappointment in order to see Doctor Carlota Raspberry for a refill - SA

## 2021-06-25 NOTE — Telephone Encounter (Signed)
Patient is due for a follow up, then can give curtesy refill

## 2021-07-02 ENCOUNTER — Other Ambulatory Visit: Payer: Self-pay

## 2021-07-02 ENCOUNTER — Ambulatory Visit: Payer: 59 | Admitting: Family Medicine

## 2021-07-02 VITALS — BP 152/80 | HR 73 | Temp 98.3°F | Resp 16 | Ht 63.0 in | Wt 162.6 lb

## 2021-07-02 DIAGNOSIS — F32A Depression, unspecified: Secondary | ICD-10-CM

## 2021-07-02 DIAGNOSIS — Z23 Encounter for immunization: Secondary | ICD-10-CM | POA: Diagnosis not present

## 2021-07-02 DIAGNOSIS — G2581 Restless legs syndrome: Secondary | ICD-10-CM

## 2021-07-02 DIAGNOSIS — E785 Hyperlipidemia, unspecified: Secondary | ICD-10-CM

## 2021-07-02 DIAGNOSIS — I10 Essential (primary) hypertension: Secondary | ICD-10-CM | POA: Diagnosis not present

## 2021-07-02 DIAGNOSIS — F3341 Major depressive disorder, recurrent, in partial remission: Secondary | ICD-10-CM

## 2021-07-02 DIAGNOSIS — F419 Anxiety disorder, unspecified: Secondary | ICD-10-CM

## 2021-07-02 LAB — COMPREHENSIVE METABOLIC PANEL
ALT: 9 U/L (ref 0–35)
AST: 11 U/L (ref 0–37)
Albumin: 4.1 g/dL (ref 3.5–5.2)
Alkaline Phosphatase: 103 U/L (ref 39–117)
BUN: 11 mg/dL (ref 6–23)
CO2: 27 mEq/L (ref 19–32)
Calcium: 9.1 mg/dL (ref 8.4–10.5)
Chloride: 104 mEq/L (ref 96–112)
Creatinine, Ser: 0.51 mg/dL (ref 0.40–1.20)
GFR: 105.7 mL/min (ref >60.00)
Glucose, Bld: 73 mg/dL (ref 70–99)
Potassium: 3.5 mEq/L (ref 3.5–5.1)
Sodium: 140 mEq/L (ref 135–145)
Total Bilirubin: 0.4 mg/dL (ref 0.2–1.2)
Total Protein: 6.3 g/dL (ref 6.0–8.3)

## 2021-07-02 LAB — LIPID PANEL
Cholesterol: 187 mg/dL (ref 0–200)
HDL: 51.4 mg/dL (ref >39.00)
LDL Cholesterol: 101 mg/dL — ABNORMAL HIGH (ref 0–99)
NonHDL: 135.63
Total CHOL/HDL Ratio: 4
Triglycerides: 171 mg/dL — ABNORMAL HIGH (ref 0.0–149.0)
VLDL: 34.2 mg/dL (ref 0.0–40.0)

## 2021-07-02 MED ORDER — BUSPIRONE HCL 5 MG PO TABS: 5.0000 mg | ORAL_TABLET | Freq: Two times a day (BID) | ORAL | 1 refills | Status: DC | PRN

## 2021-07-02 MED ORDER — GABAPENTIN 300 MG PO CAPS: 300.0000 mg | ORAL_CAPSULE | Freq: Every day | ORAL | 1 refills | Status: DC

## 2021-07-02 MED ORDER — AMLODIPINE BESYLATE 5 MG PO TABS
5.0000 mg | ORAL_TABLET | Freq: Every day | ORAL | 1 refills | Status: DC
Start: 2021-07-02 — End: 2021-12-15

## 2021-07-02 MED ORDER — VENLAFAXINE HCL ER 75 MG PO CP24: 225.0000 mg | ORAL_CAPSULE | Freq: Every day | ORAL | 3 refills | Status: DC

## 2021-07-02 NOTE — Patient Instructions (Addendum)
Try to avoid salt/sodium in foods to see if that helps swelling. Amlodipine can also cause swelling.  Follow up in next few weeks if swelling not improved with less sodium in diet.  Return to the clinic or go to the nearest emergency room if any of your symptoms worsen or new symptoms occur. Restart amlodipine same dose. Keep a record of your blood pressures outside of the office and bring them to the next office visit. Iron supplement once per day may help restless legs.  If cholesterol elevated, can try diet changes first with repeat labs in 3-6 months. Let me know if there are questions.    Managing Your Hypertension Hypertension, also called high blood pressure, is when the force of the blood pressing against the walls of the arteries is too strong. Arteries are blood vessels that carry blood from your heart throughout your body. Hypertension forces the heart to work harder to pump blood and may cause the arteries to become narrow or stiff. Understanding blood pressure readings Your personal target blood pressure may vary depending on your medical conditions, your age, and other factors. A blood pressure reading includes a higher number over a lower number. Ideally, your blood pressure should be below 120/80. You should know that: The first, or top, number is called the systolic pressure. It is a measure of the pressure in your arteries as your heart beats. The second, or bottom number, is called the diastolic pressure. It is a measure of the pressure in your arteries as the heart relaxes. Blood pressure is classified into four stages. Based on your blood pressure reading, your health care provider may use the following stages to determine what type of treatment you need, if any. Systolic pressure and diastolic pressure are measured in a unit called mmHg. Normal Systolic pressure: below 295. Diastolic pressure: below 80. Elevated Systolic pressure: 621-308. Diastolic pressure: below  80. Hypertension stage 1 Systolic pressure: 657-846. Diastolic pressure: 96-29. Hypertension stage 2 Systolic pressure: 528 or above. Diastolic pressure: 90 or above. How can this condition affect me? Managing your hypertension is an important responsibility. Over time, hypertension can damage the arteries and decrease blood flow to important parts of the body, including the brain, heart, and kidneys. Having untreated or uncontrolled hypertension can lead to: A heart attack. A stroke. A weakened blood vessel (aneurysm). Heart failure. Kidney damage. Eye damage. Metabolic syndrome. Memory and concentration problems. Vascular dementia. What actions can I take to manage this condition? Hypertension can be managed by making lifestyle changes and possibly by taking medicines. Your health care provider will help you make a plan to bring your blood pressure within a normal range. Nutrition  Eat a diet that is high in fiber and potassium, and low in salt (sodium), added sugar, and fat. An example eating plan is called the Dietary Approaches to Stop Hypertension (DASH) diet. To eat this way: Eat plenty of fresh fruits and vegetables. Try to fill one-half of your plate at each meal with fruits and vegetables. Eat whole grains, such as whole-wheat pasta, brown rice, or whole-grain bread. Fill about one-fourth of your plate with whole grains. Eat low-fat dairy products. Avoid fatty cuts of meat, processed or cured meats, and poultry with skin. Fill about one-fourth of your plate with lean proteins such as fish, chicken without skin, beans, eggs, and tofu. Avoid pre-made and processed foods. These tend to be higher in sodium, added sugar, and fat. Reduce your daily sodium intake. Most people with hypertension should  eat less than 1,500 mg of sodium a day. Lifestyle  Work with your health care provider to maintain a healthy body weight or to lose weight. Ask what an ideal weight is for you. Get at  least 30 minutes of exercise that causes your heart to beat faster (aerobic exercise) most days of the week. Activities may include walking, swimming, or biking. Include exercise to strengthen your muscles (resistance exercise), such as weight lifting, as part of your weekly exercise routine. Try to do these types of exercises for 30 minutes at least 3 days a week. Do not use any products that contain nicotine or tobacco, such as cigarettes, e-cigarettes, and chewing tobacco. If you need help quitting, ask your health care provider. Control any long-term (chronic) conditions you have, such as high cholesterol or diabetes. Identify your sources of stress and find ways to manage stress. This may include meditation, deep breathing, or making time for fun activities. Alcohol use Do not drink alcohol if: Your health care provider tells you not to drink. You are pregnant, may be pregnant, or are planning to become pregnant. If you drink alcohol: Limit how much you use to: 0-1 drink a day for women. 0-2 drinks a day for men. Be aware of how much alcohol is in your drink. In the [place] one drink equals one 12 oz bottle of beer (355 mL), one 5 oz glass of wine (148 mL), or one 1 oz glass of hard liquor (44 mL). Medicines Your health care provider may prescribe medicine if lifestyle changes are not enough to get your blood pressure under control and if: Your systolic blood pressure is 130 or higher. Your diastolic blood pressure is 80 or higher. Take medicines only as told by your health care provider. Follow the directions carefully. Blood pressure medicines must be taken as told by your health care provider. The medicine does not work as well when you skip doses. Skipping doses also puts you at risk for problems. Monitoring Before you monitor your blood pressure: Do not smoke, drink caffeinated beverages, or exercise within 30 minutes before taking a measurement. Use the bathroom and empty your bladder  (urinate). Sit quietly for at least 5 minutes before taking measurements. Monitor your blood pressure at home as told by your health care provider. To do this: Sit with your back straight and supported. Place your feet flat on the floor. Do not cross your legs. Support your arm on a flat surface, such as a table. Make sure your upper arm is at heart level. Each time you measure, take two or three readings one minute apart and record the results. You may also need to have your blood pressure checked regularly by your health care provider. General information Talk with your health care provider about your diet, exercise habits, and other lifestyle factors that may be contributing to hypertension. Review all the medicines you take with your health care provider because there may be side effects or interactions. Keep all visits as told by your health care provider. Your health care provider can help you create and adjust your plan for managing your high blood pressure. Where to find more information National Heart, Lung, and Blood Institute: https://wilson-eaton.com/ American Heart Association: www.heart.org Contact a health care provider if: You think you are having a reaction to medicines you have taken. You have repeated (recurrent) headaches. You feel dizzy. You have swelling in your ankles. You have trouble with your vision. Get help right away if: You develop a  severe headache or confusion. You have unusual weakness or numbness, or you feel faint. You have severe pain in your chest or abdomen. You vomit repeatedly. You have trouble breathing. These symptoms may represent a serious problem that is an emergency. Do not wait to see if the symptoms will go away. Get medical help right away. Call your local emergency services (911 in the [place] Do not drive yourself to the hospital. Summary Hypertension is when the force of blood pumping through your arteries is too strong. If this condition is not  controlled, it may put you at risk for serious complications. Your personal target blood pressure may vary depending on your medical conditions, your age, and other factors. For most people, a normal blood pressure is less than 120/80. Hypertension is managed by lifestyle changes, medicines, or both. Lifestyle changes to help manage hypertension include losing weight, eating a healthy, low-sodium diet, exercising more, stopping smoking, and limiting alcohol. This information is not intended to replace advice given to you by your health care provider. Make sure you discuss any questions you have with your health care provider. Document Revised: 10/20/2019 Document Reviewed: 08/15/2019 Elsevier Patient Education  2022 [employer].   Peripheral Edema Peripheral edema is swelling that is caused by a buildup of fluid. Peripheral edema most often affects the lower legs, ankles, and feet. It can also develop in the arms, hands, and face. The area of the body that has peripheral edema will look swollen. It may also feel heavy or warm. Your clothes may start to feel tight. Pressing on the area may make a temporary dent in your skin. You may not be able to move your swollen arm or leg as much as usual. There are many causes of peripheral edema. It can happen because of a complication of other conditions such as congestive heart failure, kidney disease, or a problem with your blood circulation. It also can be a side effect of certain medicines or because of an infection. It often happens to women during pregnancy. Sometimes, the cause is not known. Follow these instructions at home: Managing pain, stiffness, and swelling  Raise (elevate) your legs while you are sitting or lying down. Move around often to prevent stiffness and to lessen swelling. Do not sit or stand for long periods of time. Wear support stockings as told by your health care provider. Medicines Take over-the-counter and prescription  medicines only as told by your health care provider. Your health care provider may prescribe medicine to help your body get rid of excess water (diuretic). General instructions Pay attention to any changes in your symptoms. Follow instructions from your health care provider about limiting salt (sodium) in your diet. Sometimes, eating less salt may reduce swelling. Moisturize skin daily to help prevent skin from cracking and draining. Keep all follow-up visits as told by your health care provider. This is important. Contact a health care provider if you have: A fever. Edema that starts suddenly or is getting worse, especially if you are pregnant or have a medical condition. Swelling in only one leg. Increased swelling, redness, or pain in one or both of your legs. Drainage or sores at the area where you have edema. Get help right away if you: Develop shortness of breath, especially when you are lying down. Have pain in your chest or abdomen. Feel weak. Feel faint. Summary Peripheral edema is swelling that is caused by a buildup of fluid. Peripheral edema most often affects the lower legs, ankles, and feet.  Move around often to prevent stiffness and to lessen swelling. Do not sit or stand for long periods of time. Pay attention to any changes in your symptoms. Contact a health care provider if you have edema that starts suddenly or is getting worse, especially if you are pregnant or have a medical condition. Get help right away if you develop shortness of breath, especially when lying down. This information is not intended to replace advice given to you by your health care provider. Make sure you discuss any questions you have with your health care provider. Document Revised: 06/08/2018 Document Reviewed: 06/08/2018 Elsevier Patient Education  2022 [employer].

## 2021-07-02 NOTE — Progress Notes (Signed)
Subjective:  Patient ID: Rachel Roth, female    DOB: [birth year]  Age: 55 y.o. MRN: [id number]  CC:  Chief Complaint  Patient presents with   Hypertension    Pt reports ran out 3 days ago and has noted a headache since not having this, pt here to get refill.    Leg Pain    Pt has RLS and uses Gabapentin to help treat    Anxiety    Pt is in need of refill effexor pt notes close to being out     HPI Rachel Roth presents for   Hypertension: Has run out of medications recently, previously treated with Norvasc 5 mg daily - out for 3 days. Some headache off meds. Some lower leg swelling since summer. No CP/dyspnea. No problems now, alternates legs at times.  Does eat fast food, take out food, no added salt.  Better with elevation overnight.   BP Readings from Last 3 Encounters:  07/02/21 (!) 152/80  01/29/21 128/80  07/31/20 130/77   Lab Results  Component Value Date   CREATININE 0.51 07/02/2021    Hyperlipidemia: No current statin. Fast food.restaurant food/take out as above.  Wt Readings from Last 3 Encounters:  07/02/21 162 lb 9.6 oz (73.8 kg)  01/29/21 163 lb 8 oz (74.2 kg)  07/31/20 158 lb 3.2 oz (71.8 kg)    Lab Results  Component Value Date   CHOL 187 07/02/2021   HDL 51.40 07/02/2021   LDLCALC 101 (H) 07/02/2021   TRIG 171.0 (H) 07/02/2021   CHOLHDL 4 07/02/2021   Lab Results  Component Value Date   ALT 9 07/02/2021   AST 11 07/02/2021   ALKPHOS 103 07/02/2021   BILITOT 0.4 07/02/2021     Restless leg syndrome Treated with gabapentin.  Discussed some increased symptoms back in May.  Option of second dose of gabapentin if needed at night with potential side effects of those medications discussed.  Ferritin was 29 in May, option of over-the-counter iron supplement once per day discussed previously. Did not receive.  Still some occasional flare, but usually controlled with one gabapentin.   Anxiety/depression Treated with BuSpar, Effexor,  stable symptoms last visit. Doing good. Occasional hard days, but overall doing well.    GAD 7 : Generalized Anxiety Score 07/02/2021 01/29/2021  Nervous, Anxious, on Edge 1 1  Control/stop worrying 3 3  Worry too much - different things 3 2  Trouble relaxing 2 0  Restless 3 3  Easily annoyed or irritable 1 1  Afraid - awful might happen 0 0  Total GAD 7 Score 13 10    Depression screen [facility] 2/9 07/02/2021 01/29/2021 07/31/2020 07/10/2020 05/01/2020  Decreased Interest 2 1 0 0 0  Down, Depressed, Hopeless 0 0 0 0 0  PHQ - 2 Score 2 1 0 0 0  Altered sleeping 2 3 - - -  Tired, decreased energy 3 3 - - -  Change in appetite 0 0 - - -  Feeling bad or failure about yourself  0 0 - - -  Trouble concentrating 0 0 - - -  Moving slowly or fidgety/restless 0 0 - - -  Suicidal thoughts 0 0 - - -  PHQ-9 Score 7 7 - - -  Difficult doing work/chores - Somewhat difficult - - -  Some recent data might be hidden     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  Recurrent major depressive disorder, in partial remission ([place]) 04/12/2018   Arthritis 10/25/2016   RLS (restless legs syndrome) 03/19/2015   Lower back pain 07/09/2012   Hip pain 07/09/2012   Cervical radiculopathy 05/29/2012   Anxiety 05/29/2012   Insomnia 05/29/2012   BMI 31.0-31.9,adult 05/29/2012   Depression 12/17/2011   Past Medical History:  Diagnosis Date   Allergy    Arthritis    Past Surgical History:  Procedure Laterality Date   ABDOMINAL HYSTERECTOMY     CESAREAN SECTION     Allergies  Allergen Reactions   Codeine Nausea And Vomiting   Prior to Admission medications   Medication Sig Start Date End Date Taking? Authorizing Provider  amLODipine (NORVASC) 5 MG tablet Take 1 tablet (5 mg total) by mouth daily. 01/29/21  Yes Wendie Agreste, MD  busPIRone (BUSPAR) 5 MG tablet Take 1 tablet (5 mg total) by mouth 2 (two) times daily as needed. 01/29/21  Yes  Wendie Agreste, MD  gabapentin (NEURONTIN) 300 MG capsule Take 1-2 capsules (300-600 mg total) by mouth at bedtime. 01/29/21  Yes Wendie Agreste, MD  meloxicam (MOBIC) 7.5 MG tablet TAKE 1 TABLET BY MOUTH EVERY DAY 06/09/21  Yes Wendie Agreste, MD  venlafaxine XR (EFFEXOR-XR) 75 MG 24 hr capsule Take 3 capsules (225 mg total) by mouth daily with breakfast. 01/29/21  Yes Wendie Agreste, MD   Social History   Socioeconomic History   Marital status: Married    Spouse name: Not on file   Number of children: Not on file   Years of education: Not on file   Highest education level: Not on file  Occupational History   Not on file  Tobacco Use   Smoking status: Every Day    Packs/day: 0.50    Types: Cigarettes   Smokeless tobacco: Never  Substance and Sexual Activity   Alcohol use: No   Drug use: No   Sexual activity: Yes    Birth control/protection: None  Other Topics Concern   Not on file  Social History Narrative   Not on file   Social Determinants of Health   Financial Resource Strain: Not on file  Food Insecurity: Not on file  Transportation Needs: Not on file  Physical Activity: Not on file  Stress: Not on file  Social Connections: Not on file  Intimate Partner Violence: Not on file    Review of Systems  Constitutional:  Negative for fatigue and unexpected weight change.  Respiratory:  Negative for chest tightness and shortness of breath.   Cardiovascular:  Positive for leg swelling. Negative for chest pain and palpitations.  Gastrointestinal:  Negative for abdominal pain and blood in stool.  Neurological:  Negative for dizziness, syncope, light-headedness and headaches (off med only.).    Objective:   Vitals:   07/02/21 0914  BP: (!) 152/80  Pulse: 73  Resp: 16  Temp: 98.3 F (36.8 C)  TempSrc: Temporal  SpO2: 97%  Weight: 162 lb 9.6 oz (73.8 kg)  Height: 5\' 3"  (1.6 m)     Physical Exam Vitals reviewed.  Constitutional:      Appearance: Normal  appearance. She is well-developed.  HENT:     Head: Normocephalic and atraumatic.  Eyes:     Conjunctiva/sclera: Conjunctivae normal.     Pupils: Pupils are equal, round, and reactive to light.  Neck:     Vascular: No carotid bruit.  Cardiovascular:     Rate and Rhythm: Normal rate and regular rhythm.  Heart sounds: Normal heart sounds.  Pulmonary:     Effort: Pulmonary effort is normal.     Breath sounds: Normal breath sounds.  Abdominal:     Palpations: Abdomen is soft. There is no pulsatile mass.     Tenderness: There is no abdominal tenderness.  Musculoskeletal:     Right lower leg: No edema.     Left lower leg: No edema.  Skin:    General: Skin is warm and dry.  Neurological:     Mental Status: She is alert and oriented to person, place, and time.  Psychiatric:        Mood and Affect: Mood normal.        Behavior: Behavior normal.       Assessment & Plan:  Rachel Roth is a 55 y.o. female . Essential hypertension - Plan: amLODipine (NORVASC) 5 MG tablet, Comprehensive metabolic panel  -Restart amlodipine, monitor for worsening leg swelling, watch sodium in diet, RTC precautions if persistent BP elevations.  Anxiety and depression - Plan: busPIRone (BUSPAR) 5 MG tablet, venlafaxine XR (EFFEXOR-XR) 75 MG 24 hr capsule  -Stable, continue same regimen. Recurrent major depressive disorder, in partial remission ([place]) - Plan: busPIRone (BUSPAR) 5 MG tablet, venlafaxine XR (EFFEXOR-XR) 75 MG 24 hr capsule  -Stable, continue same regimen  RLS (restless legs syndrome) - Plan: gabapentin (NEURONTIN) 300 MG capsule  -Continue gabapentin same dose, add iron supplement as that may also be helpful.  RTC precautions.  Need for influenza vaccination - Plan: Flu Vaccine QUAD 6+ mos PF IM (Fluarix Quad PF)  Hyperlipidemia, unspecified hyperlipidemia type - Plan: Lipid panel  -Check labs, lifestyle modification, diet approach possible initially.  Meds ordered this  encounter  Medications   amLODipine (NORVASC) 5 MG tablet    Sig: Take 1 tablet (5 mg total) by mouth daily.    Dispense:  90 tablet    Refill:  1   busPIRone (BUSPAR) 5 MG tablet    Sig: Take 1 tablet (5 mg total) by mouth 2 (two) times daily as needed.    Dispense:  180 tablet    Refill:  1   gabapentin (NEURONTIN) 300 MG capsule    Sig: Take 1-2 capsules (300-600 mg total) by mouth at bedtime.    Dispense:  90 capsule    Refill:  1   venlafaxine XR (EFFEXOR-XR) 75 MG 24 hr capsule    Sig: Take 3 capsules (225 mg total) by mouth daily with breakfast.    Dispense:  90 capsule    Refill:  3   Patient Instructions  Try to avoid salt/sodium in foods to see if that helps swelling. Amlodipine can also cause swelling.  Follow up in next few weeks if swelling not improved with less sodium in diet.  Return to the clinic or go to the nearest emergency room if any of your symptoms worsen or new symptoms occur. Restart amlodipine same dose. Keep a record of your blood pressures outside of the office and bring them to the next office visit. Iron supplement once per day may help restless legs.  If cholesterol elevated, can try diet changes first with repeat labs in 3-6 months. Let me know if there are questions.    Managing Your Hypertension Hypertension, also called high blood pressure, is when the force of the blood pressing against the walls of the arteries is too strong. Arteries are blood vessels that carry blood from your heart throughout your body. Hypertension forces the heart to  work harder to pump blood and may cause the arteries to become narrow or stiff. Understanding blood pressure readings Your personal target blood pressure may vary depending on your medical conditions, your age, and other factors. A blood pressure reading includes a higher number over a lower number. Ideally, your blood pressure should be below 120/80. You should know that: The first, or top, number is called the  systolic pressure. It is a measure of the pressure in your arteries as your heart beats. The second, or bottom number, is called the diastolic pressure. It is a measure of the pressure in your arteries as the heart relaxes. Blood pressure is classified into four stages. Based on your blood pressure reading, your health care provider may use the following stages to determine what type of treatment you need, if any. Systolic pressure and diastolic pressure are measured in a unit called mmHg. Normal Systolic pressure: below 354. Diastolic pressure: below 80. Elevated Systolic pressure: 562-563. Diastolic pressure: below 80. Hypertension stage 1 Systolic pressure: 893-734. Diastolic pressure: 28-76. Hypertension stage 2 Systolic pressure: 811 or above. Diastolic pressure: 90 or above. How can this condition affect me? Managing your hypertension is an important responsibility. Over time, hypertension can damage the arteries and decrease blood flow to important parts of the body, including the brain, heart, and kidneys. Having untreated or uncontrolled hypertension can lead to: A heart attack. A stroke. A weakened blood vessel (aneurysm). Heart failure. Kidney damage. Eye damage. Metabolic syndrome. Memory and concentration problems. Vascular dementia. What actions can I take to manage this condition? Hypertension can be managed by making lifestyle changes and possibly by taking medicines. Your health care provider will help you make a plan to bring your blood pressure within a normal range. Nutrition  Eat a diet that is high in fiber and potassium, and low in salt (sodium), added sugar, and fat. An example eating plan is called the Dietary Approaches to Stop Hypertension (DASH) diet. To eat this way: Eat plenty of fresh fruits and vegetables. Try to fill one-half of your plate at each meal with fruits and vegetables. Eat whole grains, such as whole-wheat pasta, brown rice, or whole-grain  bread. Fill about one-fourth of your plate with whole grains. Eat low-fat dairy products. Avoid fatty cuts of meat, processed or cured meats, and poultry with skin. Fill about one-fourth of your plate with lean proteins such as fish, chicken without skin, beans, eggs, and tofu. Avoid pre-made and processed foods. These tend to be higher in sodium, added sugar, and fat. Reduce your daily sodium intake. Most people with hypertension should eat less than 1,500 mg of sodium a day. Lifestyle  Work with your health care provider to maintain a healthy body weight or to lose weight. Ask what an ideal weight is for you. Get at least 30 minutes of exercise that causes your heart to beat faster (aerobic exercise) most days of the week. Activities may include walking, swimming, or biking. Include exercise to strengthen your muscles (resistance exercise), such as weight lifting, as part of your weekly exercise routine. Try to do these types of exercises for 30 minutes at least 3 days a week. Do not use any products that contain nicotine or tobacco, such as cigarettes, e-cigarettes, and chewing tobacco. If you need help quitting, ask your health care provider. Control any long-term (chronic) conditions you have, such as high cholesterol or diabetes. Identify your sources of stress and find ways to manage stress. This may include meditation, deep breathing,  or making time for fun activities. Alcohol use Do not drink alcohol if: Your health care provider tells you not to drink. You are pregnant, may be pregnant, or are planning to become pregnant. If you drink alcohol: Limit how much you use to: 0-1 drink a day for women. 0-2 drinks a day for men. Be aware of how much alcohol is in your drink. In the [place] one drink equals one 12 oz bottle of beer (355 mL), one 5 oz glass of wine (148 mL), or one 1 oz glass of hard liquor (44 mL). Medicines Your health care provider may prescribe medicine if lifestyle  changes are not enough to get your blood pressure under control and if: Your systolic blood pressure is 130 or higher. Your diastolic blood pressure is 80 or higher. Take medicines only as told by your health care provider. Follow the directions carefully. Blood pressure medicines must be taken as told by your health care provider. The medicine does not work as well when you skip doses. Skipping doses also puts you at risk for problems. Monitoring Before you monitor your blood pressure: Do not smoke, drink caffeinated beverages, or exercise within 30 minutes before taking a measurement. Use the bathroom and empty your bladder (urinate). Sit quietly for at least 5 minutes before taking measurements. Monitor your blood pressure at home as told by your health care provider. To do this: Sit with your back straight and supported. Place your feet flat on the floor. Do not cross your legs. Support your arm on a flat surface, such as a table. Make sure your upper arm is at heart level. Each time you measure, take two or three readings one minute apart and record the results. You may also need to have your blood pressure checked regularly by your health care provider. General information Talk with your health care provider about your diet, exercise habits, and other lifestyle factors that may be contributing to hypertension. Review all the medicines you take with your health care provider because there may be side effects or interactions. Keep all visits as told by your health care provider. Your health care provider can help you create and adjust your plan for managing your high blood pressure. Where to find more information National Heart, Lung, and Blood Institute: https://wilson-eaton.com/ American Heart Association: www.heart.org Contact a health care provider if: You think you are having a reaction to medicines you have taken. You have repeated (recurrent) headaches. You feel dizzy. You have swelling  in your ankles. You have trouble with your vision. Get help right away if: You develop a severe headache or confusion. You have unusual weakness or numbness, or you feel faint. You have severe pain in your chest or abdomen. You vomit repeatedly. You have trouble breathing. These symptoms may represent a serious problem that is an emergency. Do not wait to see if the symptoms will go away. Get medical help right away. Call your local emergency services (911 in the [place] Do not drive yourself to the hospital. Summary Hypertension is when the force of blood pumping through your arteries is too strong. If this condition is not controlled, it may put you at risk for serious complications. Your personal target blood pressure may vary depending on your medical conditions, your age, and other factors. For most people, a normal blood pressure is less than 120/80. Hypertension is managed by lifestyle changes, medicines, or both. Lifestyle changes to help manage hypertension include losing weight, eating a healthy, low-sodium diet, exercising  more, stopping smoking, and limiting alcohol. This information is not intended to replace advice given to you by your health care provider. Make sure you discuss any questions you have with your health care provider. Document Revised: 10/20/2019 Document Reviewed: 08/15/2019 Elsevier Patient Education  2022 [place].   Peripheral Edema Peripheral edema is swelling that is caused by a buildup of fluid. Peripheral edema most often affects the lower legs, ankles, and feet. It can also develop in the arms, hands, and face. The area of the body that has peripheral edema will look swollen. It may also feel heavy or warm. Your clothes may start to feel tight. Pressing on the area may make a temporary dent in your skin. You may not be able to move your swollen arm or leg as much as usual. There are many causes of peripheral edema. It can happen because of a complication  of other conditions such as congestive heart failure, kidney disease, or a problem with your blood circulation. It also can be a side effect of certain medicines or because of an infection. It often happens to women during pregnancy. Sometimes, the cause is not known. Follow these instructions at home: Managing pain, stiffness, and swelling  Raise (elevate) your legs while you are sitting or lying down. Move around often to prevent stiffness and to lessen swelling. Do not sit or stand for long periods of time. Wear support stockings as told by your health care provider. Medicines Take over-the-counter and prescription medicines only as told by your health care provider. Your health care provider may prescribe medicine to help your body get rid of excess water (diuretic). General instructions Pay attention to any changes in your symptoms. Follow instructions from your health care provider about limiting salt (sodium) in your diet. Sometimes, eating less salt may reduce swelling. Moisturize skin daily to help prevent skin from cracking and draining. Keep all follow-up visits as told by your health care provider. This is important. Contact a health care provider if you have: A fever. Edema that starts suddenly or is getting worse, especially if you are pregnant or have a medical condition. Swelling in only one leg. Increased swelling, redness, or pain in one or both of your legs. Drainage or sores at the area where you have edema. Get help right away if you: Develop shortness of breath, especially when you are lying down. Have pain in your chest or abdomen. Feel weak. Feel faint. Summary Peripheral edema is swelling that is caused by a buildup of fluid. Peripheral edema most often affects the lower legs, ankles, and feet. Move around often to prevent stiffness and to lessen swelling. Do not sit or stand for long periods of time. Pay attention to any changes in your symptoms. Contact a  health care provider if you have edema that starts suddenly or is getting worse, especially if you are pregnant or have a medical condition. Get help right away if you develop shortness of breath, especially when lying down. This information is not intended to replace advice given to you by your health care provider. Make sure you discuss any questions you have with your health care provider. Document Revised: 06/08/2018 Document Reviewed: 06/08/2018 Elsevier Patient Education  2022 [place],   Merri Ray, MD [place] 07/03/21 10:19 PM

## 2021-07-24 ENCOUNTER — Other Ambulatory Visit: Payer: Self-pay | Admitting: Family Medicine

## 2021-07-24 DIAGNOSIS — F3341 Major depressive disorder, recurrent, in partial remission: Secondary | ICD-10-CM

## 2021-07-24 DIAGNOSIS — F419 Anxiety disorder, unspecified: Secondary | ICD-10-CM

## 2021-07-24 DIAGNOSIS — F32A Depression, unspecified: Secondary | ICD-10-CM

## 2021-08-03 ENCOUNTER — Other Ambulatory Visit: Payer: Self-pay | Admitting: Family Medicine

## 2021-08-03 DIAGNOSIS — M542 Cervicalgia: Secondary | ICD-10-CM

## 2021-08-03 DIAGNOSIS — M1990 Unspecified osteoarthritis, unspecified site: Secondary | ICD-10-CM

## 2021-10-03 ENCOUNTER — Other Ambulatory Visit: Payer: Self-pay | Admitting: Family Medicine

## 2021-10-03 DIAGNOSIS — M542 Cervicalgia: Secondary | ICD-10-CM

## 2021-10-03 DIAGNOSIS — M1990 Unspecified osteoarthritis, unspecified site: Secondary | ICD-10-CM

## 2021-10-03 NOTE — Telephone Encounter (Signed)
Patient is requesting a refill of the following medications: Requested Prescriptions   Pending Prescriptions Disp Refills   meloxicam (MOBIC) 7.5 MG tablet [Pharmacy Med Name: MELOXICAM 7.5 MG TABLET] 30 tablet 1    Sig: TAKE 1 TABLET BY MOUTH EVERY DAY    Date of patient request: 10/03/21 Last office visit: 07/02/21 Date of last refill: 08/04/2021 Last refill amount: 30x 1R

## 2021-11-25 ENCOUNTER — Other Ambulatory Visit: Payer: Self-pay | Admitting: Family Medicine

## 2021-11-25 DIAGNOSIS — M1990 Unspecified osteoarthritis, unspecified site: Secondary | ICD-10-CM

## 2021-11-25 DIAGNOSIS — M542 Cervicalgia: Secondary | ICD-10-CM

## 2021-12-14 ENCOUNTER — Other Ambulatory Visit: Payer: Self-pay | Admitting: Family Medicine

## 2021-12-14 DIAGNOSIS — I10 Essential (primary) hypertension: Secondary | ICD-10-CM

## 2021-12-31 ENCOUNTER — Ambulatory Visit: Payer: 59 | Admitting: Family Medicine

## 2022-01-21 ENCOUNTER — Other Ambulatory Visit: Payer: Self-pay | Admitting: Family Medicine

## 2022-01-21 DIAGNOSIS — F3341 Major depressive disorder, recurrent, in partial remission: Secondary | ICD-10-CM

## 2022-01-21 DIAGNOSIS — F32A Depression, unspecified: Secondary | ICD-10-CM

## 2022-01-21 DIAGNOSIS — F419 Anxiety disorder, unspecified: Secondary | ICD-10-CM

## 2022-01-24 ENCOUNTER — Other Ambulatory Visit: Payer: Self-pay | Admitting: Family Medicine

## 2022-01-24 DIAGNOSIS — M542 Cervicalgia: Secondary | ICD-10-CM

## 2022-01-24 DIAGNOSIS — M1990 Unspecified osteoarthritis, unspecified site: Secondary | ICD-10-CM

## 2022-01-26 NOTE — Telephone Encounter (Signed)
Patient is requesting a refill of the following medications: ?Requested Prescriptions  ? ?Pending Prescriptions Disp Refills  ? meloxicam (MOBIC) 7.5 MG tablet [Pharmacy Med Name: MELOXICAM 7.5 MG TABLET] 30 tablet 1  ?  Sig: TAKE 1 TABLET BY MOUTH EVERY DAY  ? ? ?Date of patient request: 01/26/22 ?Last office visit: 07/02/21 ?Date of last refill: 11/25/21 ?Last refill amount: 30 ? ? ?

## 2022-01-31 ENCOUNTER — Other Ambulatory Visit: Payer: Self-pay | Admitting: Family Medicine

## 2022-01-31 DIAGNOSIS — F3341 Major depressive disorder, recurrent, in partial remission: Secondary | ICD-10-CM

## 2022-01-31 DIAGNOSIS — F32A Depression, unspecified: Secondary | ICD-10-CM

## 2022-02-23 ENCOUNTER — Other Ambulatory Visit: Payer: Self-pay | Admitting: Family Medicine

## 2022-02-23 DIAGNOSIS — M1990 Unspecified osteoarthritis, unspecified site: Secondary | ICD-10-CM

## 2022-02-23 DIAGNOSIS — M542 Cervicalgia: Secondary | ICD-10-CM

## 2022-02-25 NOTE — Telephone Encounter (Signed)
Patient is requesting a refill of the following medications: Requested Prescriptions   Pending Prescriptions Disp Refills   meloxicam (MOBIC) 7.5 MG tablet [Pharmacy Med Name: MELOXICAM 7.5 MG TABLET] 30 tablet 0    Sig: TAKE 1 TABLET BY MOUTH EVERY DAY    Date of patient request: 02/25/22 Last office visit: 07/02/21 Date of last refill: 01/27/22 Last refill amount: 30

## 2022-02-25 NOTE — Telephone Encounter (Signed)
Medication last filled 01/27/22 with notation to pharmacy that office visit needed for further refills.  Last appointment scheduled with me April 5 was no-show, and I do not see another one scheduled at this time.  Previous appointment in October 2022.  Please schedule appointment and then we can look at potentially refilling medication temporarily until that scheduled appointment.

## 2022-02-26 NOTE — Telephone Encounter (Signed)
Sent a mychart message asking pt to call to schedule an appt

## 2022-04-18 ENCOUNTER — Other Ambulatory Visit: Payer: Self-pay | Admitting: Family Medicine

## 2022-04-18 DIAGNOSIS — F419 Anxiety disorder, unspecified: Secondary | ICD-10-CM

## 2022-04-18 DIAGNOSIS — F3341 Major depressive disorder, recurrent, in partial remission: Secondary | ICD-10-CM

## 2022-06-15 ENCOUNTER — Other Ambulatory Visit: Payer: Self-pay | Admitting: Family Medicine

## 2022-06-15 DIAGNOSIS — I10 Essential (primary) hypertension: Secondary | ICD-10-CM

## 2022-06-18 ENCOUNTER — Encounter: Payer: Self-pay | Admitting: Family Medicine

## 2022-06-18 ENCOUNTER — Ambulatory Visit (INDEPENDENT_AMBULATORY_CARE_PROVIDER_SITE_OTHER): Payer: 59 | Admitting: Family Medicine

## 2022-06-18 VITALS — BP 138/78 | HR 76 | Temp 98.4°F | Ht 63.0 in | Wt 167.2 lb

## 2022-06-18 DIAGNOSIS — M542 Cervicalgia: Secondary | ICD-10-CM | POA: Diagnosis not present

## 2022-06-18 DIAGNOSIS — M62838 Other muscle spasm: Secondary | ICD-10-CM | POA: Diagnosis not present

## 2022-06-18 DIAGNOSIS — Z23 Encounter for immunization: Secondary | ICD-10-CM

## 2022-06-18 MED ORDER — CYCLOBENZAPRINE HCL 5 MG PO TABS: 5.0000 mg | ORAL_TABLET | Freq: Three times a day (TID) | ORAL | 1 refills | Status: DC | PRN

## 2022-06-18 NOTE — Progress Notes (Signed)
Subjective:  Patient ID: Rachel Roth, female    DOB: 05/13/1966  Age: 56 y.o. MRN: [id number]  CC:  Chief Complaint  Patient presents with   Neck Pain    Pt states neck pain is getting worse, pt states her neck has been hurting since June, pt states her neck is constantly popping, pt has tried tylenol, hot rag, heating pad with no relief     HPI [name] presents for    Neck pain: Chronic neck pain years, more noisy with popping, clicking since June. More pain/ache at times. No recent injury or change in activity.  R side, moves to top of R shoulder, does not radiate down arm. No arm weakness, normal grip strength - not dropping objects. Hurts to turn right or left. Right side is worse recently, and difficulty turning. No new HA, fever or photophobia. Hx of cervical radiculopathy by problem list . No prior neck injection, surgery or PT, no ortho eval for neck.  R shoulder pain in 2021, seen at urgent care after amusement park ride. Improved at follow up with me. Prior on mobic that helped.   Tx: hot shower, warm compresses. Tylenol.  On gabapentin 1 at bedtime for RLS. No change in neck pain with this med.   XR c spine 05/01/20: FINDINGS: There is no evidence of cervical spine fracture or prevertebral soft tissue swelling. Alignment is normal. No other significant bone abnormalities are identified.   Degenerative disease with disc height loss at C4-5. No foraminal stenosis.   IMPRESSION: Degenerative disease with disc height loss at C4-5.    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   Recurrent major depressive disorder, in partial remission ([place]) 04/12/2018   Arthritis 10/25/2016   RLS (restless legs syndrome) 03/19/2015   Lower back pain 07/09/2012   Hip pain 07/09/2012   Cervical radiculopathy 05/29/2012   Anxiety 05/29/2012   Insomnia 05/29/2012   BMI 31.0-31.9,adult 05/29/2012    Depression 12/17/2011   Past Medical History:  Diagnosis Date   Allergy    Arthritis    Past Surgical History:  Procedure Laterality Date   ABDOMINAL HYSTERECTOMY     CESAREAN SECTION     Allergies  Allergen Reactions   Codeine Nausea And Vomiting   Prior to Admission medications   Medication Sig Start Date End Date Taking? Authorizing Provider  amLODipine (NORVASC) 5 MG tablet TAKE 1 TABLET (5 MG TOTAL) BY MOUTH DAILY. 06/15/22  Yes Wendie Agreste, MD  busPIRone (BUSPAR) 5 MG tablet TAKE 1 TABLET BY MOUTH TWICE A DAY AS NEEDED 02/03/22  Yes Wendie Agreste, MD  gabapentin (NEURONTIN) 300 MG capsule Take 1-2 capsules (300-600 mg total) by mouth at bedtime. 07/02/21  Yes Wendie Agreste, MD  venlafaxine XR (EFFEXOR-XR) 75 MG 24 hr capsule TAKE 3 CAPSULES (225 MG TOTAL) BY MOUTH DAILY WITH BREAKFAST. 04/20/22  Yes Wendie Agreste, MD  meloxicam (MOBIC) 7.5 MG tablet TAKE 1 TABLET BY MOUTH EVERY DAY Patient not taking: Reported on 06/18/2022 01/27/22   Wendie Agreste, MD   Social History   Socioeconomic History   Marital status: Married    Spouse name: Not on file   Number of children: Not on file   Years of education: Not on file   Highest education level: Not on file  Occupational History   Not on file  Tobacco Use   Smoking status: Every Day  Packs/day: 0.50    Types: Cigarettes   Smokeless tobacco: Never  Substance and Sexual Activity   Alcohol use: No   Drug use: No   Sexual activity: Yes    Birth control/protection: None  Other Topics Concern   Not on file  Social History Narrative   Not on file   Social Determinants of Health   Financial Resource Strain: Not on file  Food Insecurity: Not on file  Transportation Needs: Not on file  Physical Activity: Not on file  Stress: Not on file  Social Connections: Not on file  Intimate Partner Violence: Not on file    Review of Systems Per HPI  Objective:   Vitals:   06/18/22 1132  BP: 138/78  Pulse:  76  Temp: 98.4 F (36.9 C)  SpO2: 97%  Weight: 167 lb 3.2 oz (75.8 kg)  Height: '5\' 3"'$  (1.6 m)     Physical Exam Constitutional:      General: She is not in acute distress.    Appearance: Normal appearance. She is well-developed.  HENT:     Head: Normocephalic and atraumatic.  Cardiovascular:     Rate and Rhythm: Normal rate.  Pulmonary:     Effort: Pulmonary effort is normal.  Musculoskeletal:     Comments: C-spine, no midline bony tenderness.  Spasm noted on right paraspinals into the right trapezius.  Similar reported area of discomfort.  Range of motion was limited with primarily decreased right rotation, right lateral flexion and extension.  Intact range of motion of shoulders.  Upper extremity strength equal including grip strength bilaterally.  Neurological:     Mental Status: She is alert and oriented to person, place, and time.  Psychiatric:        Mood and Affect: Mood normal.        Assessment & Plan:  Rachel Roth is a 56 y.o. female . Need for influenza vaccination - Plan: Flu Vaccine QUAD 6+ mos PF IM (Fluarix Quad PF)  Neck pain on right side - Plan: cyclobenzaprine (FLEXERIL) 5 MG tablet, DG Cervical Spine Complete  Neck muscle spasm - Plan: cyclobenzaprine (FLEXERIL) 5 MG tablet, DG Cervical Spine Complete Possible underlying degenerative disc disease with secondary spasm of paraspinal musculature.  No known injury.  Check imaging, trial of muscle relaxant with potential side effects discussed including potential other side effects with gabapentin.  Recommended one or the other initially.  Heat, gentle range of motion and RTC precautions given.  Depending on imaging results and improvement into next week, consider Ortho eval or PT.  Meds ordered this encounter  Medications   cyclobenzaprine (FLEXERIL) 5 MG tablet    Sig: Take 1 tablet (5 mg total) by mouth 3 (three) times daily as needed for muscle spasms (start qhs prn due to sedation).     Dispense:  15 tablet    Refill:  1   Patient Instructions   Continue heat, gentle stretches.  Please have x-ray at the [facility] location below.  Muscle relaxant Flexeril up to 3 times per day but start that at bedtime as it can cause sedation and dizziness.  I do not recommend combining that with gabapentin initially, but if you do combine that medication be very careful as they both can cause similar side effects.  Give me an update next week on how you are doing, and would consider orthopedic follow-up to decide next step including possible physical therapy if you are not improving.  Hope you are better soon.  [place] Elam  Walk in 8:30-4:30 during weekdays, no appointment needed [place].  [place], [place] [address]      Signed,   Merri Ray, MD [place], [facility] 06/18/22 4:29 PM

## 2022-06-18 NOTE — Patient Instructions (Signed)
Continue heat, gentle stretches.  Please have x-ray at the [facility] location below.  Muscle relaxant Flexeril up to 3 times per day but start that at bedtime as it can cause sedation and dizziness.  I do not recommend combining that with gabapentin initially, but if you do combine that medication be very careful as they both can cause similar side effects.  Give me an update next week on how you are doing, and would consider orthopedic follow-up to decide next step including possible physical therapy if you are not improving.  Hope you are better soon.  [place] Elam  Walk in 8:30-4:30 during weekdays, no appointment needed [place].  [place], [place] [address]

## 2022-06-19 ENCOUNTER — Ambulatory Visit (INDEPENDENT_AMBULATORY_CARE_PROVIDER_SITE_OTHER)
Admission: RE | Admit: 2022-06-19 | Discharge: 2022-06-19 | Disposition: A | Discharge status: Home / Self Care | Payer: 59 | Source: Ambulatory Visit | Attending: Family Medicine | Admitting: Family Medicine

## 2022-06-19 DIAGNOSIS — M542 Cervicalgia: Secondary | ICD-10-CM | POA: Diagnosis not present

## 2022-06-19 DIAGNOSIS — M62838 Other muscle spasm: Secondary | ICD-10-CM

## 2022-06-28 ENCOUNTER — Other Ambulatory Visit: Payer: Self-pay | Admitting: Family Medicine

## 2022-06-28 DIAGNOSIS — G2581 Restless legs syndrome: Secondary | ICD-10-CM

## 2022-06-29 NOTE — Telephone Encounter (Signed)
Gabapentin 300 mg LOV: 06/18/22 Last Refill:07/02/21 Upcoming appt: none

## 2022-06-30 NOTE — Telephone Encounter (Signed)
Medication has been discussed recently, gabapentin refilled.

## 2022-08-05 ENCOUNTER — Other Ambulatory Visit: Payer: Self-pay | Admitting: Family Medicine

## 2022-08-05 DIAGNOSIS — F3341 Major depressive disorder, recurrent, in partial remission: Secondary | ICD-10-CM

## 2022-08-05 DIAGNOSIS — F32A Depression, unspecified: Secondary | ICD-10-CM

## 2022-08-06 LAB — HM MAMMOGRAPHY

## 2022-08-07 ENCOUNTER — Encounter: Payer: Self-pay | Admitting: Family Medicine

## 2022-08-29 ENCOUNTER — Other Ambulatory Visit: Payer: Self-pay | Admitting: Family Medicine

## 2022-08-29 DIAGNOSIS — F32A Depression, unspecified: Secondary | ICD-10-CM

## 2022-08-29 DIAGNOSIS — F3341 Major depressive disorder, recurrent, in partial remission: Secondary | ICD-10-CM

## 2022-09-09 ENCOUNTER — Inpatient Hospital Stay (HOSPITAL_COMMUNITY)
Admission: EM | Admit: 2022-09-09 | Discharge: 2022-09-11 | DRG: 280 | Disposition: A | Discharge status: Home / Self Care | Payer: 59 | Attending: Cardiovascular Disease | Admitting: Cardiovascular Disease

## 2022-09-09 ENCOUNTER — Encounter (HOSPITAL_COMMUNITY): Payer: Self-pay

## 2022-09-09 ENCOUNTER — Other Ambulatory Visit: Payer: Self-pay

## 2022-09-09 ENCOUNTER — Emergency Department (HOSPITAL_COMMUNITY): Payer: 59

## 2022-09-09 ENCOUNTER — Inpatient Hospital Stay (HOSPITAL_COMMUNITY)
Admission: EM | Disposition: A | Discharge status: Home / Self Care | Payer: Self-pay | Source: Home / Self Care | Attending: Cardiovascular Disease

## 2022-09-09 DIAGNOSIS — T463X5A Adverse effect of coronary vasodilators, initial encounter: Secondary | ICD-10-CM | POA: Diagnosis not present

## 2022-09-09 DIAGNOSIS — I2119 ST elevation (STEMI) myocardial infarction involving other coronary artery of inferior wall: Secondary | ICD-10-CM | POA: Diagnosis present

## 2022-09-09 DIAGNOSIS — G249 Dystonia, unspecified: Secondary | ICD-10-CM | POA: Diagnosis present

## 2022-09-09 DIAGNOSIS — G2581 Restless legs syndrome: Secondary | ICD-10-CM | POA: Diagnosis present

## 2022-09-09 DIAGNOSIS — I2542 Coronary artery dissection: Secondary | ICD-10-CM

## 2022-09-09 DIAGNOSIS — Z885 Allergy status to narcotic agent status: Secondary | ICD-10-CM

## 2022-09-09 DIAGNOSIS — I249 Acute ischemic heart disease, unspecified: Principal | ICD-10-CM

## 2022-09-09 DIAGNOSIS — R519 Headache, unspecified: Secondary | ICD-10-CM | POA: Diagnosis not present

## 2022-09-09 DIAGNOSIS — F32A Depression, unspecified: Secondary | ICD-10-CM | POA: Diagnosis present

## 2022-09-09 DIAGNOSIS — I214 Non-ST elevation (NSTEMI) myocardial infarction: Secondary | ICD-10-CM | POA: Diagnosis not present

## 2022-09-09 DIAGNOSIS — Z833 Family history of diabetes mellitus: Secondary | ICD-10-CM

## 2022-09-09 DIAGNOSIS — F17210 Nicotine dependence, cigarettes, uncomplicated: Secondary | ICD-10-CM | POA: Diagnosis present

## 2022-09-09 DIAGNOSIS — I2109 ST elevation (STEMI) myocardial infarction involving other coronary artery of anterior wall: Secondary | ICD-10-CM | POA: Diagnosis not present

## 2022-09-09 DIAGNOSIS — I10 Essential (primary) hypertension: Secondary | ICD-10-CM | POA: Diagnosis present

## 2022-09-09 DIAGNOSIS — F419 Anxiety disorder, unspecified: Secondary | ICD-10-CM | POA: Diagnosis present

## 2022-09-09 HISTORY — PX: LEFT HEART CATH AND CORONARY ANGIOGRAPHY: CATH118249

## 2022-09-09 LAB — BRAIN NATRIURETIC PEPTIDE: B Natriuretic Peptide: 23.1 pg/mL (ref 0.0–100.0)

## 2022-09-09 LAB — CBC
HCT: 42 % (ref 36.0–46.0)
Hemoglobin: 14 g/dL (ref 12.0–15.0)
MCH: 28.4 pg (ref 26.0–34.0)
MCHC: 33.3 g/dL (ref 30.0–36.0)
MCV: 85.2 fL (ref 80.0–100.0)
Platelets: 313 10*3/uL (ref 150–400)
RBC: 4.93 MIL/uL (ref 3.87–5.11)
RDW: 13.1 % (ref 11.5–15.5)
WBC: 10.2 10*3/uL (ref 4.0–10.5)
nRBC: 0 % (ref 0.0–0.2)

## 2022-09-09 LAB — BASIC METABOLIC PANEL
Anion gap: 10 (ref 5–15)
BUN: 8 mg/dL (ref 6–20)
CO2: 24 mmol/L (ref 22–32)
Calcium: 9.2 mg/dL (ref 8.9–10.3)
Chloride: 106 mmol/L (ref 98–111)
Creatinine, Ser: 0.66 mg/dL (ref 0.44–1.00)
GFR, Estimated: >60 mL/min (ref >60)
Glucose, Bld: 96 mg/dL (ref 70–99)
Potassium: 3.9 mmol/L (ref 3.5–5.1)
Sodium: 140 mmol/L (ref 135–145)

## 2022-09-09 LAB — I-STAT BETA HCG BLOOD, ED (MC, WL, AP ONLY): I-stat hCG, quantitative: 9.1 m[IU]/mL — ABNORMAL HIGH (ref <5)

## 2022-09-09 LAB — POCT ACTIVATED CLOTTING TIME: Activated Clotting Time: 287 seconds

## 2022-09-09 LAB — TROPONIN I (HIGH SENSITIVITY)
Troponin I (High Sensitivity): 4639 ng/L (ref <18)
Troponin I (High Sensitivity): >24000 ng/L (ref <18)

## 2022-09-09 LAB — HCG, QUANTITATIVE, PREGNANCY: hCG, Beta Chain, Quant, S: 7 m[IU]/mL — ABNORMAL HIGH (ref <5)

## 2022-09-09 SURGERY — LEFT HEART CATH AND CORONARY ANGIOGRAPHY
Anesthesia: LOCAL

## 2022-09-09 MED ORDER — HEPARIN SODIUM (PORCINE) 1000 UNIT/ML IJ SOLN
INTRAMUSCULAR | Status: DC | PRN
  Administered 2022-09-09: 7000 [IU] via INTRAVENOUS

## 2022-09-09 MED ORDER — ACETAMINOPHEN 325 MG PO TABS
650.0000 mg | ORAL_TABLET | ORAL | Status: DC | PRN
  Administered 2022-09-10 – 2022-09-11 (×2): 650 mg via ORAL
  Filled 2022-09-09 (×2): qty 2

## 2022-09-09 MED ORDER — LIDOCAINE HCL (PF) 1 % IJ SOLN
INTRAMUSCULAR | Status: DC | PRN
  Administered 2022-09-09: 2 mL

## 2022-09-09 MED ORDER — ONDANSETRON HCL 4 MG/2ML IJ SOLN: 4.0000 mg | Freq: Four times a day (QID) | INTRAMUSCULAR | Status: DC | PRN

## 2022-09-09 MED ORDER — SODIUM CHLORIDE 0.9 % IV SOLN: INTRAVENOUS | Status: DC

## 2022-09-09 MED ORDER — IOHEXOL 350 MG/ML SOLN
INTRAVENOUS | Status: DC | PRN
  Administered 2022-09-09: 50 mL

## 2022-09-09 MED ORDER — HEPARIN SODIUM (PORCINE) 1000 UNIT/ML IJ SOLN
INTRAMUSCULAR | Status: AC
  Filled 2022-09-09: qty 10

## 2022-09-09 MED ORDER — DIAZEPAM 5 MG PO TABS: 5.0000 mg | ORAL_TABLET | ORAL | Status: DC | PRN

## 2022-09-09 MED ORDER — ACETAMINOPHEN 325 MG PO TABS
650.0000 mg | ORAL_TABLET | ORAL | Status: DC | PRN
  Administered 2022-09-10: 650 mg via ORAL
  Filled 2022-09-09: qty 2

## 2022-09-09 MED ORDER — ATORVASTATIN CALCIUM 80 MG PO TABS
80.0000 mg | ORAL_TABLET | Freq: Every day | ORAL | Status: DC
  Administered 2022-09-10 – 2022-09-11 (×2): 80 mg via ORAL
  Filled 2022-09-09 (×2): qty 1

## 2022-09-09 MED ORDER — SODIUM CHLORIDE 0.9% FLUSH: 3.0000 mL | INTRAVENOUS | Status: DC | PRN

## 2022-09-09 MED ORDER — ASPIRIN 81 MG PO TBEC: 81.0000 mg | DELAYED_RELEASE_TABLET | Freq: Every day | ORAL | Status: DC

## 2022-09-09 MED ORDER — NITROGLYCERIN IN D5W 200-5 MCG/ML-% IV SOLN
0.0000 ug/min | INTRAVENOUS | Status: DC
  Administered 2022-09-09: 5 ug/min via INTRAVENOUS
  Filled 2022-09-09: qty 250

## 2022-09-09 MED ORDER — NITROGLYCERIN 1 MG/10 ML FOR IR/CATH LAB
INTRA_ARTERIAL | Status: AC
  Filled 2022-09-09: qty 10

## 2022-09-09 MED ORDER — GABAPENTIN 300 MG PO CAPS
300.0000 mg | ORAL_CAPSULE | Freq: Every day | ORAL | Status: DC
  Administered 2022-09-09 – 2022-09-10 (×2): 300 mg via ORAL
  Filled 2022-09-09 (×2): qty 1

## 2022-09-09 MED ORDER — VERAPAMIL HCL 2.5 MG/ML IV SOLN
INTRAVENOUS | Status: DC | PRN
  Administered 2022-09-09: 10 mL via INTRA_ARTERIAL

## 2022-09-09 MED ORDER — NITROGLYCERIN 0.4 MG SL SUBL
0.4000 mg | SUBLINGUAL_TABLET | SUBLINGUAL | Status: DC | PRN
  Administered 2022-09-09: 0.4 mg via SUBLINGUAL
  Filled 2022-09-09: qty 1

## 2022-09-09 MED ORDER — ISOSORBIDE MONONITRATE ER 30 MG PO TB24
30.0000 mg | ORAL_TABLET | Freq: Every day | ORAL | Status: DC
  Administered 2022-09-10 – 2022-09-11 (×2): 30 mg via ORAL
  Filled 2022-09-09 (×2): qty 1

## 2022-09-09 MED ORDER — ASPIRIN 81 MG PO CHEW: 324.0000 mg | CHEWABLE_TABLET | Freq: Once | ORAL | Status: DC

## 2022-09-09 MED ORDER — METOPROLOL TARTRATE 12.5 MG HALF TABLET
12.5000 mg | ORAL_TABLET | Freq: Two times a day (BID) | ORAL | Status: DC
  Administered 2022-09-09 – 2022-09-11 (×4): 12.5 mg via ORAL
  Filled 2022-09-09 (×4): qty 1

## 2022-09-09 MED ORDER — HEPARIN (PORCINE) 25000 UT/250ML-% IV SOLN
850.0000 [IU]/h | INTRAVENOUS | Status: DC
  Administered 2022-09-09: 850 [IU]/h via INTRAVENOUS
  Filled 2022-09-09: qty 250

## 2022-09-09 MED ORDER — ASPIRIN 325 MG PO TABS
ORAL_TABLET | ORAL | Status: DC | PRN
  Administered 2022-09-09: 325 mg via ORAL

## 2022-09-09 MED ORDER — FENTANYL CITRATE (PF) 100 MCG/2ML IJ SOLN
INTRAMUSCULAR | Status: DC | PRN
  Administered 2022-09-09: 25 ug via INTRAVENOUS

## 2022-09-09 MED ORDER — HEPARIN BOLUS VIA INFUSION
4000.0000 [IU] | Freq: Once | INTRAVENOUS | Status: AC
  Administered 2022-09-09: 4000 [IU] via INTRAVENOUS
  Filled 2022-09-09: qty 4000

## 2022-09-09 MED ORDER — SODIUM CHLORIDE 0.9 % IV SOLN: 250.0000 mL | INTRAVENOUS | Status: DC | PRN

## 2022-09-09 MED ORDER — ALPRAZOLAM 0.25 MG PO TABS: 0.2500 mg | ORAL_TABLET | Freq: Two times a day (BID) | ORAL | Status: DC | PRN

## 2022-09-09 MED ORDER — FENTANYL CITRATE (PF) 100 MCG/2ML IJ SOLN
INTRAMUSCULAR | Status: AC
  Filled 2022-09-09: qty 2

## 2022-09-09 MED ORDER — LIDOCAINE HCL (PF) 1 % IJ SOLN
INTRAMUSCULAR | Status: AC
  Filled 2022-09-09: qty 30

## 2022-09-09 MED ORDER — HEPARIN (PORCINE) IN NACL 1000-0.9 UT/500ML-% IV SOLN
INTRAVENOUS | Status: DC | PRN
  Administered 2022-09-09 (×2): 500 mL

## 2022-09-09 MED ORDER — ASPIRIN 300 MG RE SUPP: 300.0000 mg | RECTAL | Status: DC

## 2022-09-09 MED ORDER — AMLODIPINE BESYLATE 5 MG PO TABS
5.0000 mg | ORAL_TABLET | Freq: Every day | ORAL | Status: DC
  Administered 2022-09-09 – 2022-09-11 (×3): 5 mg via ORAL
  Filled 2022-09-09 (×3): qty 1

## 2022-09-09 MED ORDER — ASPIRIN 81 MG PO CHEW: 324.0000 mg | CHEWABLE_TABLET | ORAL | Status: DC

## 2022-09-09 MED ORDER — ASPIRIN 325 MG PO TABS
ORAL_TABLET | ORAL | Status: AC
  Filled 2022-09-09: qty 1

## 2022-09-09 MED ORDER — HEPARIN (PORCINE) IN NACL 1000-0.9 UT/500ML-% IV SOLN
INTRAVENOUS | Status: AC
  Filled 2022-09-09: qty 1000

## 2022-09-09 MED ORDER — SODIUM CHLORIDE 0.9% FLUSH
3.0000 mL | Freq: Two times a day (BID) | INTRAVENOUS | Status: DC
  Administered 2022-09-10 – 2022-09-11 (×3): 3 mL via INTRAVENOUS

## 2022-09-09 MED ORDER — VERAPAMIL HCL 2.5 MG/ML IV SOLN
INTRAVENOUS | Status: AC
  Filled 2022-09-09: qty 2

## 2022-09-09 MED ORDER — SODIUM CHLORIDE 0.9 % WEIGHT BASED INFUSION: 1.0000 mL/kg/h | INTRAVENOUS | Status: AC

## 2022-09-09 MED ORDER — LOSARTAN POTASSIUM 25 MG PO TABS
25.0000 mg | ORAL_TABLET | Freq: Every day | ORAL | Status: DC
  Administered 2022-09-10 – 2022-09-11 (×2): 25 mg via ORAL
  Filled 2022-09-09 (×2): qty 1

## 2022-09-09 MED ORDER — MIDAZOLAM HCL 2 MG/2ML IJ SOLN
INTRAMUSCULAR | Status: DC | PRN
  Administered 2022-09-09: 2 mg via INTRAVENOUS

## 2022-09-09 MED ORDER — MIDAZOLAM HCL 2 MG/2ML IJ SOLN
INTRAMUSCULAR | Status: AC
  Filled 2022-09-09: qty 2

## 2022-09-09 SURGICAL SUPPLY — 14 items
CATH INFINITI 5FR AL1 (CATHETERS) IMPLANT
CATH OPTITORQUE TIG 4.0 5F (CATHETERS) IMPLANT
DEVICE RAD COMP TR BAND LRG (VASCULAR PRODUCTS) IMPLANT
ELECT DEFIB PAD ADLT CADENCE (PAD) IMPLANT
GLIDESHEATH SLEND A-KIT 6F 22G (SHEATH) IMPLANT
GUIDEWIRE ANGLED .035X150CM (WIRE) IMPLANT
GUIDEWIRE INQWIRE 1.5J.035X260 (WIRE) IMPLANT
INQWIRE 1.5J .035X260CM (WIRE) ×1
KIT ENCORE 26 ADVANTAGE (KITS) IMPLANT
KIT HEART LEFT (KITS) ×1 IMPLANT
KIT HEMO VALVE WATCHDOG (MISCELLANEOUS) IMPLANT
PACK CARDIAC CATHETERIZATION (CUSTOM PROCEDURE TRAY) ×1 IMPLANT
TRANSDUCER W/STOPCOCK (MISCELLANEOUS) ×1 IMPLANT
TUBING CIL FLEX 10 FLL-RA (TUBING) ×1 IMPLANT

## 2022-09-09 NOTE — H&P (Signed)
Referring Physician: Regan Lemming, MD  Rachel Roth is an 56 y.o. female.                       Chief Complaint: Chest pain  HPI: 56 years old white female with PMH of HTN, Anxiety and depression , tobacco use disorder, arthritis, and restless leg syndrome had chest pain, burning type this AM after her breakfast. She waited in ED waiting room for an hour and went home. This evening chest pain and shortnes of breath returned, patient went to fire department and was sent to ER after 3 SL NTG and 325 mg. Aspirin use.  Chest pain is now felt as something like a flutter in the chest without monitor showing rhythm change. EKG this AM showed subtle inferior wall MI. She had stopped her BP medication amlodipine for 3 months. She denies any history of GI or GU bleed or stroke. Chest x-ray is unremarkable. Troponin I are climbing from 4639 ng to 24,000 + ng.   Past Medical History:  Diagnosis Date   Allergy    Arthritis       Past Surgical History:  Procedure Laterality Date   ABDOMINAL HYSTERECTOMY     CESAREAN SECTION      Family History  Problem Relation Age of Onset   Diabetes Mother    Cancer Father 83       Lung cancer   Diabetes Sister    Diabetes Brother    Diabetes Sister    Cancer Sister 26       breast cancer   Cancer Maternal Aunt 56       breast cancer   Cancer Maternal Grandmother        breast cancer   Social History:  reports that she has been smoking cigarettes. She has been smoking an average of .5 packs per day. She has never used smokeless tobacco. She reports that she does not drink alcohol and does not use drugs.  Allergies:  Allergies  Allergen Reactions   Codeine Nausea And Vomiting    (Not in a hospital admission)   Results for orders placed or performed during the hospital encounter of 09/09/22 (from the past 48 hour(s))  Basic metabolic panel     Status: None   Collection Time: 09/09/22  5:05 PM  Result Value Ref Range   Sodium 140 135 -  145 mmol/L   Potassium 3.9 3.5 - 5.1 mmol/L   Chloride 106 98 - 111 mmol/L   CO2 24 22 - 32 mmol/L   Glucose, Bld 96 70 - 99 mg/dL    Comment: Glucose reference range applies only to samples taken after fasting for at least 8 hours.   BUN 8 6 - 20 mg/dL   Creatinine, Ser 0.66 0.44 - 1.00 mg/dL   Calcium 9.2 8.9 - 10.3 mg/dL   GFR, Estimated >60 >60 mL/min    Comment: (NOTE) Calculated using the CKD-EPI Creatinine Equation (2021)    Anion gap 10 5 - 15    Comment: Performed at [facility] [address] [place], [place] [address]  Troponin I (High Sensitivity)     Status: Abnormal   Collection Time: 09/09/22  5:05 PM  Result Value Ref Range   Troponin I (High Sensitivity) 4,639 (HH) <18 ng/L    Comment: CRITICAL RESULT CALLED TO, READ BACK BY AND VERIFIED WITH P FURROW RN 09/09/2022 1857 B NUNNERY (NOTE) Elevated high sensitivity troponin I (hsTnI) values  and significant  changes across serial measurements may suggest ACS but many other  chronic and acute conditions are known to elevate hsTnI results.  Refer to the "Links" section for chest pain algorithms and additional  guidance. Performed at [facility], [place] [address], [place] [address]   CBC     Status: None   Collection Time: 09/09/22  5:05 PM  Result Value Ref Range   WBC 10.2 4.0 - 10.5 K/uL   RBC 4.93 3.87 - 5.11 MIL/uL   Hemoglobin 14.0 12.0 - 15.0 g/dL   HCT 42.0 36.0 - 46.0 %   MCV 85.2 80.0 - 100.0 fL   MCH 28.4 26.0 - 34.0 pg   MCHC 33.3 30.0 - 36.0 g/dL   RDW 13.1 11.5 - 15.5 %   Platelets 313 150 - 400 K/uL   nRBC 0.0 0.0 - 0.2 %    Comment: Performed at [facility], [place] [address], [place], [place] [address]  hCG, quantitative, pregnancy     Status: Abnormal   Collection Time: 09/09/22  5:05 PM  Result Value Ref Range   hCG, Beta Chain, Quant, S 7 (H) <5 mIU/mL    Comment:          GEST. AGE      CONC.  (mIU/mL)   <=1 WEEK        5 - 50     2 WEEKS       50 - 500     3  WEEKS       100 - 10,000     4 WEEKS     1,000 - 30,000     5 WEEKS     3,500 - 115,000   6-8 WEEKS     12,000 - 270,000    12 WEEKS     15,000 - 220,000        FEMALE AND NON-PREGNANT FEMALE:     LESS THAN 5 mIU/mL Performed at [facility], [place] [address], [place], [place] [address]   Brain natriuretic peptide (order if patient c/o SOB ONLY)     Status: None   Collection Time: 09/09/22  5:23 PM  Result Value Ref Range   B Natriuretic Peptide 23.1 0.0 - 100.0 pg/mL    Comment: Performed at [facility] [address] [place], [place] [address]  I-Stat beta hCG blood, ED     Status: Abnormal   Collection Time: 09/09/22  5:33 PM  Result Value Ref Range   I-stat hCG, quantitative 9.1 (H) <5 mIU/mL   Comment 3            Comment:   GEST. AGE      CONC.  (mIU/mL)   <=1 WEEK        5 - 50     2 WEEKS       50 - 500     3 WEEKS       100 - 10,000     4 WEEKS     1,000 - 30,000        FEMALE AND NON-PREGNANT FEMALE:     LESS THAN 5 mIU/mL   Troponin I (High Sensitivity)     Status: Abnormal   Collection Time: 09/09/22  7:11 PM  Result Value Ref Range   Troponin I (High Sensitivity) >24,000 (HH) <18 ng/L    Comment: CRITICAL RESULT CALLED TO, READ BACK BY AND VERIFIED WITH Jossie Ng RN  09/09/2022 2010 BNUNNERY (NOTE) Elevated high sensitivity troponin I (hsTnI) values and significant  changes across serial measurements may suggest ACS but many other  chronic and acute conditions are known to elevate hsTnI results.  Refer to the "Links" section for chest pain algorithms and additional  guidance. Performed at [facility], [place] [address] [place],  [address]    DG Chest Portable 1 View  Result Date: 09/09/2022 CLINICAL DATA:  Chest pain. EXAM: PORTABLE CHEST 1 VIEW COMPARISON:  November 18, 2016. FINDINGS: The heart size and mediastinal contours are within normal limits. Both lungs are clear. The visualized skeletal structures are unremarkable. IMPRESSION:  No active disease. Electronically Signed   By: Marijo Conception M.D.   On: 09/09/2022 17:43    Review Of Systems Constitutional: No fever, chills, weight loss or gain. Eyes: No vision change, wears glasses. No discharge or pain. Ears: No hearing loss, No tinnitus. Respiratory: No asthma, COPD, pneumonias. Positive shortness of breath. No hemoptysis. Cardiovascular: Positive chest pain, palpitation, no leg edema. Gastrointestinal: No nausea, vomiting, diarrhea, constipation. No GI bleed. No hepatitis. Genitourinary: No dysuria, hematuria, kidney stone. No incontinance. Neurological: No headache, stroke, seizures.  Psychiatry: No psych facility admission for anxiety, depression, suicide. No detox. Skin: No rash. Musculoskeletal: No joint pain, fibromyalgia. No neck pain, back pain.  Lymphadenopathy: No lymphadenopathy. Hematology: No anemia or easy bruising.   Blood pressure (!) 157/89, pulse 83, temperature 98.8 F (37.1 C), temperature source Oral, resp. rate 14, height '5\' 4"'$  (1.626 m), weight 75.8 kg, SpO2 96 %. Body mass index is 28.67 kg/m. General appearance: alert, cooperative, appears stated age and no distress Head: Normocephalic, atraumatic. Eyes: Hazel eyes, pink conjunctiva, corneas clear. PERRL, EOM's intact. Neck: No adenopathy, no carotid bruit, no JVD, supple, symmetrical, trachea midline and thyroid not enlarged. Resp: Clear to auscultation bilaterally. Cardio: Regular rate and rhythm, S1, S2 normal, II/VI systolic murmur, no click, rub or gallop GI: Soft, non-tender; bowel sounds normal; no organomegaly. Extremities: No edema, cyanosis or clubbing. 2 + radial pulses. Skin: Warm and dry.  Neurologic: Alert and oriented X 3, normal strength. Normal coordination and gait.  Assessment/Plan Acute inferior wall MI HTN Tobacco use disorder Arthritis Restless leg syndrome Anxiety and depresion  Plan: Cardiac catheterization. Patient understood procedure and  risks. Dr. Adrian Prows notified, he is in agreement to do intervention tonight and not waiting till tomorrow.  Time spent: Review of old records, Lab, x-rays, EKG, other cardiac tests, examination, discussion with patient/Husband/Nurse/Doctor over 70 minutes.  Birdie Riddle, MD  09/09/2022, 8:43 PM

## 2022-09-09 NOTE — H&P (View-Only) (Signed)
Charts reviewed, history reviewed, past medical history of hypertension, tobacco use disorder, Who presented earlier this morning with chest pain but waited in the emergency room and walked out as she was not seen, came back via EMS with chest pain and EKG suggestive of inferior STEMI.  I reviewed the EKG, she had no respiratory changes, compared that to the old EKG, I recommended that she go to the emergency room intracardiac catheterization lab to be further evaluated.  I was told that the systolic blood pressure was 200 mmHg.  This evening heart troponins have been increasing, she still having chest pain that she had earlier this morning, although much improved.  Her family members at bedside.  Physical Exam Neck:     Vascular: No carotid bruit or JVD.  Cardiovascular:     Rate and Rhythm: Normal rate and regular rhythm.     Pulses: Intact distal pulses.     Heart sounds: Normal heart sounds. No murmur heard.    No gallop.  Pulmonary:     Effort: Pulmonary effort is normal.     Breath sounds: Normal breath sounds.  Abdominal:     General: Bowel sounds are normal.     Palpations: Abdomen is soft.  Musculoskeletal:     Right lower leg: No edema.     Left lower leg: No edema.    EKG 09/09/2022 at 20: 19 hours normal sinus rhythm, normal axis, poor R wave progression, low-voltage complexes.  Pulmonary disease pattern.  Cannot exclude inferior infarct old.  Compared to EKG at 1711 hrs., QS complex and aVF is slightly more prominent but otherwise no change.  Compared to 1902 hrs., no change from 1711 hrs.  Cardiac Panel (last 3 results) Recent Labs    09/09/22 1705 09/09/22 1911  TROPONINIHS 4,639* >24,000*    1.  NSTEMI 2.  Primary hypertension 3.  Tobacco use disorder  Recommendation: I met the family, met the patient, personally examined the patient, explained that she clearly has NSTEMI and she is still having chest discomfort, with increasing serum troponins I would recommend  that we proceed with cardiac catheterization.  Schedule for cardiac catheterization, and possible angioplasty. We discussed regarding risks, benefits, alternatives to this including stress testing, CTA and continued medical therapy. Patient wants to proceed. Understands <1-2% risk of death, stroke, MI, urgent CABG, bleeding, infection, renal failure but not limited to these.    Adrian Prows, MD, [facility] 09/09/2022, 9:11 PM Office: [phone] Fax: [phone] Pager: [phone]

## 2022-09-09 NOTE — Progress Notes (Signed)
Charts reviewed, history reviewed, past medical history of hypertension, tobacco use disorder, Who presented earlier this morning with chest pain but waited in the emergency room and walked out as she was not seen, came back via EMS with chest pain and EKG suggestive of inferior STEMI.  I reviewed the EKG, she had no respiratory changes, compared that to the old EKG, I recommended that she go to the emergency room intracardiac catheterization lab to be further evaluated.  I was told that the systolic blood pressure was 200 mmHg.  This evening heart troponins have been increasing, she still having chest pain that she had earlier this morning, although much improved.  Her family members at bedside.  Physical Exam Neck:     Vascular: No carotid bruit or JVD.  Cardiovascular:     Rate and Rhythm: Normal rate and regular rhythm.     Pulses: Intact distal pulses.     Heart sounds: Normal heart sounds. No murmur heard.    No gallop.  Pulmonary:     Effort: Pulmonary effort is normal.     Breath sounds: Normal breath sounds.  Abdominal:     General: Bowel sounds are normal.     Palpations: Abdomen is soft.  Musculoskeletal:     Right lower leg: No edema.     Left lower leg: No edema.    EKG 09/09/2022 at 20: 19 hours normal sinus rhythm, normal axis, poor R wave progression, low-voltage complexes.  Pulmonary disease pattern.  Cannot exclude inferior infarct old.  Compared to EKG at 1711 hrs., QS complex and aVF is slightly more prominent but otherwise no change.  Compared to 1902 hrs., no change from 1711 hrs.  Cardiac Panel (last 3 results) Recent Labs    09/09/22 1705 09/09/22 1911  TROPONINIHS 4,639* >24,000*    1.  NSTEMI 2.  Primary hypertension 3.  Tobacco use disorder  Recommendation: I met the family, met the patient, personally examined the patient, explained that she clearly has NSTEMI and she is still having chest discomfort, with increasing serum troponins I would recommend  that we proceed with cardiac catheterization.  Schedule for cardiac catheterization, and possible angioplasty. We discussed regarding risks, benefits, alternatives to this including stress testing, CTA and continued medical therapy. Patient wants to proceed. Understands <1-2% risk of death, stroke, MI, urgent CABG, bleeding, infection, renal failure but not limited to these.    Adrian Prows, MD, [facility] 09/09/2022, 9:11 PM Office: [phone] Fax: [phone] Pager: [phone]

## 2022-09-09 NOTE — Progress Notes (Signed)
ANTICOAGULATION CONSULT NOTE - Initial Consult  Pharmacy Consult for Heparin infusion Indication: chest pain/ACS  Allergies  Allergen Reactions   Codeine Nausea And Vomiting    Patient Measurements: Height: '5\' 4"'$  (162.6 cm) Weight: 75.8 kg (167 lb) IBW/kg (Calculated) : 54.7 Heparin Dosing Weight: 70.6 kg  Vital Signs: Temp: 98.8 F (37.1 C) (12/13 1713) Temp Source: Oral (12/13 1713) BP: 167/91 (12/13 1900) Pulse Rate: 90 (12/13 1900)  Labs: Recent Labs    09/09/22 1705  HGB 14.0  HCT 42.0  PLT 313  CREATININE 0.66  TROPONINIHS 4,639*    Estimated Creatinine Clearance: 79.1 mL/min (by C-G formula based on SCr of 0.66 mg/dL).   Medical History: Past Medical History:  Diagnosis Date   Allergy    Arthritis     Medications:  (Not in a hospital admission)   Assessment: 56 yo F BIB EMS to ED c/o chest pain/pressure since 09:30am. Pain radiates to arm. Pt received ASA '324mg'$  x1 and 3 tablets of SL NTG from EMS en route to ED. Pt was not taking any anticoagulation prior to admission. Pharmacy consulted to dose and manage heparin infusion per STEMI protocol.   Hgb 14, Plt 313 Troponin 4639  Goal of Therapy:  Heparin level 0.3-0.7 units/ml Monitor platelets by anticoagulation protocol: Yes   Plan:  Give Heparin 4000 units IV bolus x1, then Start heparin infusion at 850 units/hr Check heparin level in 6 hours Monitor daily CBC, heparin level, and for s/sx of bleeding  Luisa Hart, PharmD, BCPS Clinical Pharmacist 09/09/2022 7:39 PM   Please refer to [facility] for pharmacy phone number

## 2022-09-09 NOTE — ED Triage Notes (Signed)
Patient arrived to the ED via EMS with c/o of chest pain/pressure. Patient states this started around 9:30 after eating breakfast. Pt states it felt like heart burn and was sweating. Pt reports pain radiating to arm. Patient states she came here at the waiting room then left after an hour. Patient states after going home she was having chest pain to which she went to the fire station to get her BP checked. Fire department called EMS. Patient was given 324 mg of aspirin and 3 nitro from EMS to which helps relieve the pressure. Patient is A&O x4.

## 2022-09-09 NOTE — ED Provider Notes (Addendum)
[facility] EMERGENCY DEPARTMENT Provider Note   CSN: [id number] Arrival date & time: 09/09/22  1702     History  Chief Complaint  Patient presents with   Chest Pain    Rachel Roth is a 56 y.o. female.   Chest Pain    56 year old female with past medical history significant for anxiety, depression, arthritis who presented initially to the emergency department this morning burning left-sided chest pain that radiated to her left shoulder.  She waited in the waiting room the hour and subsequently left without being seen and went home.  She endorsed continued chest discomfort and mild shortness of breath and went to a fire department where EMS was called.  She was administered 325 mg of aspirin and 3 sublingual nitroglycerin with significant improvement in her pain.  She now endorses a somewhat fluttering and tightness in her left chest with no radiation.  She denies any ripping tearing chest pain radiating to her back.  EKG this morning had showed an inferior wall MI, code STEMI had been called with EMS en route but had been canceled by cardiology.  On arrival, the patient endorsed 2 out of 10 chest discomfort, significantly improved status post nitroglycerin.  Home Medications Prior to Admission medications   Medication Sig Start Date End Date Taking? Authorizing Provider  acetaminophen (TYLENOL) 325 MG tablet Take 325 mg by mouth every 6 (six) hours as needed for mild pain, fever or headache.   Yes [provider]  busPIRone (BUSPAR) 5 MG tablet TAKE 1 TABLET BY MOUTH TWICE A DAY AS NEEDED Patient taking differently: Take 5 mg by mouth 2 (two) times daily. 08/31/22  Yes Wendie Agreste, MD  gabapentin (NEURONTIN) 300 MG capsule TAKE 1-2 CAPSULES (300-600 MG TOTAL) BY MOUTH AT BEDTIME. Patient taking differently: Take 300 mg by mouth at bedtime. 06/30/22  Yes Wendie Agreste, MD  venlafaxine XR (EFFEXOR-XR) 75 MG 24 hr capsule TAKE 3 CAPSULES (225 MG  TOTAL) BY MOUTH DAILY WITH BREAKFAST. 04/20/22  Yes Wendie Agreste, MD  amLODipine (NORVASC) 5 MG tablet TAKE 1 TABLET (5 MG TOTAL) BY MOUTH DAILY. Patient not taking: Reported on 09/09/2022 06/15/22   Wendie Agreste, MD  cyclobenzaprine (FLEXERIL) 5 MG tablet Take 1 tablet (5 mg total) by mouth 3 (three) times daily as needed for muscle spasms (start qhs prn due to sedation). Patient not taking: Reported on 09/09/2022 06/18/22   Wendie Agreste, MD  meloxicam (MOBIC) 7.5 MG tablet TAKE 1 TABLET BY MOUTH EVERY DAY Patient not taking: Reported on 06/18/2022 01/27/22   Wendie Agreste, MD      Allergies    Codeine    Review of Systems   Review of Systems  Cardiovascular:  Positive for chest pain.  All other systems reviewed and are negative.   Physical Exam Updated Vital Signs BP (!) 154/80   Pulse 78   Temp 98.8 F (37.1 C) (Oral)   Resp 11   Ht '5\' 4"'$  (1.626 m)   Wt 75.8 kg   SpO2 100%   BMI 28.67 kg/m  Physical Exam Vitals and nursing note reviewed.  Constitutional:      General: She is not in acute distress.    Appearance: She is well-developed. She is not ill-appearing or diaphoretic.  HENT:     Head: Normocephalic and atraumatic.  Eyes:     Conjunctiva/sclera: Conjunctivae normal.  Cardiovascular:     Rate and Rhythm: Normal rate and regular rhythm.  Pulses: Normal pulses.     Heart sounds: No murmur heard. Pulmonary:     Effort: Pulmonary effort is normal. No respiratory distress.     Breath sounds: Normal breath sounds.  Abdominal:     Palpations: Abdomen is soft.     Tenderness: There is no abdominal tenderness.  Musculoskeletal:        General: No swelling.     Cervical back: Neck supple.     Right lower leg: No edema.     Left lower leg: No edema.  Skin:    General: Skin is warm and dry.     Capillary Refill: Capillary refill takes less than 2 seconds.  Neurological:     Mental Status: She is alert.  Psychiatric:        Mood and Affect: Mood  normal.     ED Results / Procedures / Treatments   Labs (all labs ordered are listed, but only abnormal results are displayed) Labs Reviewed  HCG, QUANTITATIVE, PREGNANCY - Abnormal; Notable for the following components:      Result Value   hCG, Beta Chain, Quant, S 7 (*)    All other components within normal limits  I-STAT BETA HCG BLOOD, ED ([facility], [facility], [facility] ONLY) - Abnormal; Notable for the following components:   I-stat hCG, quantitative 9.1 (*)    All other components within normal limits  TROPONIN I (HIGH SENSITIVITY) - Abnormal; Notable for the following components:   Troponin I (High Sensitivity) 4,639 (*)    All other components within normal limits  TROPONIN I (HIGH SENSITIVITY) - Abnormal; Notable for the following components:   Troponin I (High Sensitivity) >24,000 (*)    All other components within normal limits  BASIC METABOLIC PANEL  CBC  BRAIN NATRIURETIC PEPTIDE  HEPARIN LEVEL (UNFRACTIONATED)  CBC  HIV ANTIBODY (ROUTINE TESTING W REFLEX)  LIPOPROTEIN A (LPA)  BASIC METABOLIC PANEL  LIPID PANEL  CBG MONITORING, ED    EKG None  Radiology DG Chest Portable 1 View  Result Date: 09/09/2022 CLINICAL DATA:  Chest pain. EXAM: PORTABLE CHEST 1 VIEW COMPARISON:  November 18, 2016. FINDINGS: The heart size and mediastinal contours are within normal limits. Both lungs are clear. The visualized skeletal structures are unremarkable. IMPRESSION: No active disease. Electronically Signed   By: Marijo Conception M.D.   On: 09/09/2022 17:43    Procedures .Critical Care  Performed by: Regan Lemming, MD Authorized by: Regan Lemming, MD   Critical care provider statement:    Critical care time (minutes):  80   Critical care was time spent personally by me on the following activities:  Development of treatment plan with patient or surrogate, discussions with consultants, evaluation of patient's response to treatment, examination of patient, ordering and review of laboratory  studies, ordering and review of radiographic studies, ordering and performing treatments and interventions, pulse oximetry, re-evaluation of patient's condition and review of old charts   Care discussed with: admitting provider       Medications Ordered in ED Medications  nitroGLYCERIN (NITROSTAT) SL tablet 0.4 mg ( Sublingual MAR Hold 09/09/22 2106)  nitroGLYCERIN 50 mg in dextrose 5 % 250 mL (0.2 mg/mL) infusion (5 mcg/min Intravenous New Bag/Given 09/09/22 2018)  heparin ADULT infusion 100 units/mL (25000 units/252m) (850 Units/hr Intravenous New Bag/Given 09/09/22 2016)  aspirin chewable tablet 324 mg ( Oral MAR Hold 09/09/22 2106)    Or  aspirin suppository 300 mg ( Rectal MAR Hold 09/09/22 2106)  aspirin EC tablet 81 mg (  Oral Automatically Held 09/18/22 1000)  acetaminophen (TYLENOL) tablet 650 mg ( Oral MAR Hold 09/09/22 2106)  ondansetron (ZOFRAN) injection 4 mg ( Intravenous MAR Hold 09/09/22 2106)  0.9 %  sodium chloride infusion (has no administration in time range)  metoprolol tartrate (LOPRESSOR) tablet 12.5 mg ( Oral Automatically Held 09/17/22 2200)  atorvastatin (LIPITOR) tablet 80 mg ( Oral Automatically Held 09/17/22 1000)  ALPRAZolam (XANAX) tablet 0.25 mg ( Oral MAR Hold 09/09/22 2106)  sodium chloride flush (NS) 0.9 % injection 3 mL ( Intravenous Automatically Held 09/17/22 2200)  heparin bolus via infusion 4,000 Units (4,000 Units Intravenous Bolus from Bag 09/09/22 2016)    ED Course/ Medical Decision Making/ A&P Clinical Course as of 09/09/22 2113  Wed Sep 09, 2022  1900 Troponin I (High Sensitivity)(!!): 1,497 [JL]  2023 Troponin I (High Sensitivity)(!!): >24,000 [JL]    Clinical Course User Index [JL] Regan Lemming, MD                           Medical Decision Making Amount and/or Complexity of Data Reviewed Labs: ordered. Decision-making details documented in ED Course. Radiology: ordered.  Risk Prescription drug management. Decision regarding  hospitalization.     56 year old female with past medical history significant for anxiety, depression, arthritis who presented initially to the emergency department this morning burning left-sided chest pain that radiated to her left shoulder.  She waited in the waiting room the hour and subsequently left without being seen and went home.  She endorsed continued chest discomfort and mild shortness of breath and went to a fire department where EMS was called.  She was administered 325 mg of aspirin and 3 sublingual nitroglycerin with significant improvement in her pain.  She now endorses a somewhat fluttering and tightness in her left chest with no radiation.  She denies any ripping tearing chest pain radiating to her back.  EKG this morning had showed an inferior wall MI, code STEMI had been called with EMS en route but had been canceled by cardiology.  On arrival, the patient endorsed 2 out of 10 chest discomfort, significantly improved status post nitroglycerin.  On arrival, the patient was vitally stable, hypertensive to 160/84, saturating well on room air.  Physical exam significant for patient in no distress, not diaphoretic, in no extremis.  She endorses 2 out of 10 chest discomfort that is left-sided.  She endorses typical symptoms concerning for ACS.The patient's pain is not ripping or tearing radiating to the back, she has symmetric and intact distal pulses.  Low concern for aortic dissection or PE.  My primary concern is for ACS.  Notified by the cardiology master at time of arrival that the patient's code STEMI had been canceled by cardiology.  Initial EKG revealed similar EKG changes in the inferior leads compared to previous EMS EKG so therefore code STEMI not activated.    The patient's initial troponin resulted elevated at 4639.  Her chest pain remains at 2 out of 10, administered an additional sublingual nitroglycerin and the patient was then started on a nitro gtt. with concern for ACS and  started on heparin.  On repeat assessment, the patient's pain had resolved.  I had consulted cardiology emergently due to concern for ACS.  Repeat troponin greater than 24,000.  Code STEMI reactivated by cardiology on-call with concern for acute inferior wall MI and the patient was transported emergently to the Cath Lab in critical but stable condition.   Final  Clinical Impression(s) / ED Diagnoses Final diagnoses:  ACS (acute coronary syndrome) ([place])  Acute MI, inferior wall [facility])    Rx / DC Orders ED Discharge Orders     None         Regan Lemming, MD 09/09/22 2113    Regan Lemming, MD 09/09/22 2114

## 2022-09-09 NOTE — Interval H&P Note (Signed)
History and Physical Interval Note:  09/09/2022 9:14 PM  Rachel Roth  has presented today for surgery, with the diagnosis of STEMI.  The various methods of treatment have been discussed with the patient and family. After consideration of risks, benefits and other options for treatment, the patient has consented to  Procedure(s): LEFT HEART CATH AND CORONARY ANGIOGRAPHY (N/A) and possible coronary angioplasty as a surgical intervention.  The patient's history has been reviewed, patient examined, no change in status, stable for surgery.  I have reviewed the patient's chart and labs.  Questions were answered to the patient's satisfaction.     Adrian Prows

## 2022-09-10 ENCOUNTER — Encounter (HOSPITAL_COMMUNITY): Payer: Self-pay | Admitting: Cardiology

## 2022-09-10 ENCOUNTER — Inpatient Hospital Stay (HOSPITAL_COMMUNITY): Payer: 59

## 2022-09-10 LAB — HIV ANTIBODY (ROUTINE TESTING W REFLEX): HIV Screen 4th Generation wRfx: NONREACTIVE

## 2022-09-10 LAB — BASIC METABOLIC PANEL
Anion gap: 8 (ref 5–15)
BUN: 8 mg/dL (ref 6–20)
CO2: 24 mmol/L (ref 22–32)
Calcium: 8.6 mg/dL — ABNORMAL LOW (ref 8.9–10.3)
Chloride: 108 mmol/L (ref 98–111)
Creatinine, Ser: 0.61 mg/dL (ref 0.44–1.00)
GFR, Estimated: >60 mL/min (ref >60)
Glucose, Bld: 100 mg/dL — ABNORMAL HIGH (ref 70–99)
Potassium: 3.5 mmol/L (ref 3.5–5.1)
Sodium: 140 mmol/L (ref 135–145)

## 2022-09-10 LAB — ECHOCARDIOGRAM COMPLETE
Area-P 1/2: 3.07 cm2
Calc EF: 62.6 %
Height: 64 in
S' Lateral: 2.8 cm
Single Plane A2C EF: 62 %
Single Plane A4C EF: 63.2 %
Weight: 2617.3 oz

## 2022-09-10 LAB — CBC
HCT: 38.5 % (ref 36.0–46.0)
Hemoglobin: 13.1 g/dL (ref 12.0–15.0)
MCH: 28.9 pg (ref 26.0–34.0)
MCHC: 34 g/dL (ref 30.0–36.0)
MCV: 85 fL (ref 80.0–100.0)
Platelets: 325 10*3/uL (ref 150–400)
RBC: 4.53 MIL/uL (ref 3.87–5.11)
RDW: 13.1 % (ref 11.5–15.5)
WBC: 10.4 10*3/uL (ref 4.0–10.5)
nRBC: 0 % (ref 0.0–0.2)

## 2022-09-10 LAB — LIPID PANEL
Cholesterol: 190 mg/dL (ref 0–200)
HDL: 35 mg/dL — ABNORMAL LOW (ref >40)
LDL Cholesterol: 112 mg/dL — ABNORMAL HIGH (ref 0–99)
Total CHOL/HDL Ratio: 5.4 RATIO
Triglycerides: 213 mg/dL — ABNORMAL HIGH (ref <150)
VLDL: 43 mg/dL — ABNORMAL HIGH (ref 0–40)

## 2022-09-10 LAB — MRSA NEXT GEN BY PCR, NASAL: MRSA by PCR Next Gen: NOT DETECTED

## 2022-09-10 MED ORDER — ORAL CARE MOUTH RINSE: 15.0000 mL | OROMUCOSAL | Status: DC | PRN

## 2022-09-10 MED ORDER — VENLAFAXINE HCL ER 75 MG PO CP24
225.0000 mg | ORAL_CAPSULE | Freq: Every day | ORAL | Status: DC
  Administered 2022-09-10 – 2022-09-11 (×2): 225 mg via ORAL
  Filled 2022-09-10: qty 3
  Filled 2022-09-10 (×2): qty 1

## 2022-09-10 MED ORDER — BUSPIRONE HCL 5 MG PO TABS
5.0000 mg | ORAL_TABLET | Freq: Two times a day (BID) | ORAL | Status: DC
  Administered 2022-09-10 – 2022-09-11 (×3): 5 mg via ORAL
  Filled 2022-09-10 (×3): qty 1

## 2022-09-10 MED ORDER — PERFLUTREN LIPID MICROSPHERE
1.0000 mL | INTRAVENOUS | Status: AC | PRN
  Administered 2022-09-10: 4 mL via INTRAVENOUS

## 2022-09-10 MED ORDER — CHLORHEXIDINE GLUCONATE CLOTH 2 % EX PADS
6.0000 | MEDICATED_PAD | Freq: Every day | CUTANEOUS | Status: DC
  Administered 2022-09-10 – 2022-09-11 (×2): 6 via TOPICAL

## 2022-09-10 MED FILL — Nitroglycerin IV Soln 100 MCG/ML in D5W: INTRA_ARTERIAL | Qty: 10 | Status: AC

## 2022-09-10 NOTE — Plan of Care (Signed)
  Problem: Education: Goal: Understanding of cardiac disease, CV risk reduction, and recovery process will improve Outcome: Progressing Goal: Individualized Educational Video(s) Outcome: Progressing   Problem: Activity: Goal: Ability to tolerate increased activity will improve Outcome: Progressing   Problem: Education: Goal: Understanding of CV disease, CV risk reduction, and recovery process will improve Outcome: Progressing

## 2022-09-10 NOTE — Progress Notes (Signed)
CARDIAC REHAB PHASE I   PRE:  Rate/Rhythm: 75 NSR  BP:  Sitting: 103/56      SaO2: 93 RA  MODE:  Ambulation: 125 ft   AD:  None  POST:  Rate/Rhythm: 90 NSR  BP:  Sitting: 114/53      SaO2: 98 RA  Pt amb with handheld assistance, pt denies CP and SOB during amb and was returned to chair w/o complaint. Amb completed with short laps (25-7f).  Post-ambulation RPE 12 (scale 6-20) Pt amb limited d/t shuffling feet, limping,and slight medial swaying while walking. Pt denies pain in leg/feet.   Pt was educated on SCAD MI, wt restrictions, no baths/daily wash-ups, s/s of infection, ex guidelines (progressive walking), s/s to stop exercising, NTG use and calling 911, heart healthy diet, risk factors (smoking, High LDLs), and CRPII. Pt received MI book and materials on exercise, diet, and smoking cessation) [facility]   JChristen Bame 2:04 PM 09/10/2022    Service time is from 1305 to 1405.

## 2022-09-10 NOTE — Progress Notes (Signed)
  Echocardiogram 2D Echocardiogram has been performed.  Darlina Sicilian M 09/10/2022, 9:45 AM

## 2022-09-10 NOTE — Progress Notes (Signed)
Ref: Wendie Agreste, MD   Subjective:  Awake. C/O headache from Isosorbide use. Off heparin for possible spontaneous coronary artery dissection. Echocardiogram shows Apical dyskinesia and mid to apical anterior wall hypokinesia EF 50-55 %.  Objective:  Vital Signs in the last 24 hours: Temp:  [98.3 F (36.8 C)-98.8 F (37.1 C)] 98.6 F (37 C) (12/14 1157) Pulse Rate:  [56-108] 62 (12/14 0800) Cardiac Rhythm: Normal sinus rhythm (12/14 0800) Resp:  [11-21] 20 (12/14 0800) BP: (125-176)/(75-92) 125/78 (12/14 0600) SpO2:  [93 %-100 %] 94 % (12/14 0800) Weight:  [74 kg-75.8 kg] 74.2 kg (12/14 0443)  Physical Exam: BP Readings from Last 1 Encounters:  09/10/22 125/78     Wt Readings from Last 1 Encounters:  09/10/22 74.2 kg    Weight change:  Body mass index is 28.08 kg/m. HEENT: [place]/AT, Eyes-Hazel, Conjunctiva-Pink, Sclera-Non-icteric Neck: No JVD, No bruit, Trachea midline. Lungs:  Clear, Bilateral. Cardiac:  Regular rhythm, normal S1 and S2, no S3. II/VI systolic murmur. Abdomen:  Soft, non-tender. BS present. Extremities:  No edema present. No cyanosis. No clubbing. Right radial cath site is stable. CNS: AxOx3, Cranial nerves grossly intact, moves all 4 extremities.  Skin: Warm and dry.   Intake/Output from previous day: 12/13 0701 - 12/14 0700 In: 240 [P.O.:240] Out: 200 [Urine:200]    Lab Results: BMET    Component Value Date/Time   NA 140 09/10/2022 0513   NA 140 09/09/2022 1705   NA 140 07/02/2021 0952   NA 144 03/20/2020 1424   NA 143 01/13/2019 1018   NA 143 06/29/2018 1210   K 3.5 09/10/2022 0513   K 3.9 09/09/2022 1705   K 3.5 07/02/2021 0952   CL 108 09/10/2022 0513   CL 106 09/09/2022 1705   CL 104 07/02/2021 0952   CO2 24 09/10/2022 0513   CO2 24 09/09/2022 1705   CO2 27 07/02/2021 0952   GLUCOSE 100 (H) 09/10/2022 0513   GLUCOSE 96 09/09/2022 1705   GLUCOSE 73 07/02/2021 0952   BUN 8 09/10/2022 0513   BUN 8 09/09/2022 1705   BUN 11  07/02/2021 0952   BUN 14 03/20/2020 1424   BUN 11 01/13/2019 1018   BUN 11 06/29/2018 1210   CREATININE 0.61 09/10/2022 0513   CREATININE 0.66 09/09/2022 1705   CREATININE 0.51 07/02/2021 0952   CREATININE 0.60 03/19/2015 1616   CREATININE 0.46 (L) 11/01/2013 1601   CREATININE 0.71 05/28/2012 1656   CALCIUM 8.6 (L) 09/10/2022 0513   CALCIUM 9.2 09/09/2022 1705   CALCIUM 9.1 07/02/2021 0952   GFRNONAA >60 09/10/2022 0513   GFRNONAA >60 09/09/2022 1705   GFRNONAA 103 03/20/2020 1424   GFRNONAA 97 01/13/2019 1018   GFRNONAA 107 06/29/2018 1210   GFRAA 119 03/20/2020 1424   GFRAA 111 01/13/2019 1018   GFRAA 123 06/29/2018 1210   CBC    Component Value Date/Time   WBC 10.4 09/10/2022 0513   RBC 4.53 09/10/2022 0513   HGB 13.1 09/10/2022 0513   HGB 13.8 01/13/2019 1018   HCT 38.5 09/10/2022 0513   HCT 40.5 01/13/2019 1018   PLT 325 09/10/2022 0513   PLT 326 01/13/2019 1018   MCV 85.0 09/10/2022 0513   MCV 82 01/13/2019 1018   MCH 28.9 09/10/2022 0513   MCHC 34.0 09/10/2022 0513   RDW 13.1 09/10/2022 0513   RDW 13.6 01/13/2019 1018   LYMPHSABS 2.5 03/19/2015 1616   MONOABS 0.6 03/19/2015 1616   EOSABS 0.4 03/19/2015 1616  BASOSABS 0.0 03/19/2015 1616   HEPATIC Function Panel No results for input(s): "PROT", "ALBUMIN", "AST", "ALT", "ALKPHOS", "BILIDIR", "IBILI" in the last 8760 hours. HEMOGLOBIN A1C No results found for: "MPG" CARDIAC ENZYMES No results found for: "CKTOTAL", "CKMB", "CKMBINDEX", "TROPONINI" BNP No results for input(s): "PROBNP" in the last 8760 hours. TSH No results for input(s): "TSH" in the last 8760 hours. CHOLESTEROL Recent Labs    09/10/22 0513  CHOL 190    Scheduled Meds:  amLODipine  5 mg Oral Daily   atorvastatin  80 mg Oral Daily   busPIRone  5 mg Oral BID   Chlorhexidine Gluconate Cloth  6 each Topical Daily   gabapentin  300 mg Oral QHS   isosorbide mononitrate  30 mg Oral Daily   losartan  25 mg Oral Daily   metoprolol  tartrate  12.5 mg Oral BID   sodium chloride flush  3 mL Intravenous Q12H   sodium chloride flush  3 mL Intravenous Q12H   venlafaxine XR  225 mg Oral Q breakfast   Continuous Infusions:  sodium chloride     sodium chloride     PRN Meds:.sodium chloride, sodium chloride, acetaminophen, acetaminophen, ALPRAZolam, diazepam, nitroGLYCERIN, ondansetron (ZOFRAN) IV, mouth rinse, sodium chloride flush, sodium chloride flush  Assessment/Plan: Acute anterior wall MI HTN Tobacco use disorder Arthritis Restless leg syndrome Anxiety and depression  Plan: Resume home medications Use Tylenol as needed. Transfer to cardiac telemetry.   LOS: 1 day   Time spent including chart review, lab review, examination, discussion with patient/Nusre/Family : 30 min   Dixie Dials  MD  09/10/2022, 1:37 PM

## 2022-09-11 LAB — BASIC METABOLIC PANEL
Anion gap: 11 (ref 5–15)
BUN: 13 mg/dL (ref 6–20)
CO2: 22 mmol/L (ref 22–32)
Calcium: 8.8 mg/dL — ABNORMAL LOW (ref 8.9–10.3)
Chloride: 105 mmol/L (ref 98–111)
Creatinine, Ser: 0.69 mg/dL (ref 0.44–1.00)
GFR, Estimated: >60 mL/min (ref >60)
Glucose, Bld: 90 mg/dL (ref 70–99)
Potassium: 4 mmol/L (ref 3.5–5.1)
Sodium: 138 mmol/L (ref 135–145)

## 2022-09-11 LAB — CBC
HCT: 38.7 % (ref 36.0–46.0)
Hemoglobin: 13.2 g/dL (ref 12.0–15.0)
MCH: 29.3 pg (ref 26.0–34.0)
MCHC: 34.1 g/dL (ref 30.0–36.0)
MCV: 85.8 fL (ref 80.0–100.0)
Platelets: 303 10*3/uL (ref 150–400)
RBC: 4.51 MIL/uL (ref 3.87–5.11)
RDW: 12.9 % (ref 11.5–15.5)
WBC: 8.9 10*3/uL (ref 4.0–10.5)
nRBC: 0 % (ref 0.0–0.2)

## 2022-09-11 LAB — TROPONIN I (HIGH SENSITIVITY): Troponin I (High Sensitivity): 3909 ng/L (ref <18)

## 2022-09-11 LAB — LIPOPROTEIN A (LPA): Lipoprotein (a): 79 nmol/L — ABNORMAL HIGH (ref <75.0)

## 2022-09-11 MED ORDER — SACUBITRIL-VALSARTAN 24-26 MG PO TABS
1.0000 | ORAL_TABLET | Freq: Two times a day (BID) | ORAL | Status: DC
  Administered 2022-09-11: 1 via ORAL
  Filled 2022-09-11: qty 1

## 2022-09-11 MED ORDER — METOPROLOL TARTRATE 25 MG PO TABS: 12.5000 mg | ORAL_TABLET | Freq: Two times a day (BID) | ORAL | 3 refills | Status: DC

## 2022-09-11 MED ORDER — NITROGLYCERIN 0.4 MG SL SUBL: 0.4000 mg | SUBLINGUAL_TABLET | SUBLINGUAL | 1 refills | Status: AC | PRN

## 2022-09-11 MED ORDER — ISOSORBIDE MONONITRATE ER 30 MG PO TB24: 30.0000 mg | ORAL_TABLET | Freq: Every day | ORAL | 3 refills | Status: DC

## 2022-09-11 MED ORDER — ATORVASTATIN CALCIUM 80 MG PO TABS: 80.0000 mg | ORAL_TABLET | Freq: Every day | ORAL | 3 refills | Status: DC

## 2022-09-11 MED ORDER — SACUBITRIL-VALSARTAN 24-26 MG PO TABS: 1.0000 | ORAL_TABLET | Freq: Two times a day (BID) | ORAL | 3 refills | Status: DC

## 2022-09-11 NOTE — Plan of Care (Signed)
  Problem: Education: Goal: Understanding of cardiac disease, CV risk reduction, and recovery process will improve Outcome: Adequate for Discharge Goal: Individualized Educational Video(s) Outcome: Adequate for Discharge   Problem: Activity: Goal: Ability to tolerate increased activity will improve Outcome: Adequate for Discharge   Problem: Cardiac: Goal: Ability to achieve and maintain adequate cardiovascular perfusion will improve Outcome: Adequate for Discharge   Problem: Health Behavior/Discharge Planning: Goal: Ability to safely manage health-related needs after discharge will improve Outcome: Adequate for Discharge   Problem: Education: Goal: Understanding of CV disease, CV risk reduction, and recovery process will improve Outcome: Adequate for Discharge Goal: Individualized Educational Video(s) Outcome: Adequate for Discharge   Problem: Activity: Goal: Ability to return to baseline activity level will improve Outcome: Adequate for Discharge   Problem: Cardiovascular: Goal: Ability to achieve and maintain adequate cardiovascular perfusion will improve Outcome: Adequate for Discharge Goal: Vascular access site(s) Level 0-1 will be maintained Outcome: Adequate for Discharge   Problem: Health Behavior/Discharge Planning: Goal: Ability to safely manage health-related needs after discharge will improve Outcome: Adequate for Discharge   Problem: Education: Goal: Knowledge of General Education information will improve Description: Including pain rating scale, medication(s)/side effects and non-pharmacologic comfort measures Outcome: Adequate for Discharge   Problem: Health Behavior/Discharge Planning: Goal: Ability to manage health-related needs will improve Outcome: Adequate for Discharge   Problem: Clinical Measurements: Goal: Ability to maintain clinical measurements within normal limits will improve Outcome: Adequate for Discharge Goal: Will remain free from  infection Outcome: Adequate for Discharge Goal: Diagnostic test results will improve Outcome: Adequate for Discharge Goal: Respiratory complications will improve Outcome: Adequate for Discharge Goal: Cardiovascular complication will be avoided Outcome: Adequate for Discharge   Problem: Activity: Goal: Risk for activity intolerance will decrease Outcome: Adequate for Discharge   Problem: Nutrition: Goal: Adequate nutrition will be maintained Outcome: Adequate for Discharge   Problem: Coping: Goal: Level of anxiety will decrease Outcome: Adequate for Discharge   Problem: Elimination: Goal: Will not experience complications related to bowel motility Outcome: Adequate for Discharge Goal: Will not experience complications related to urinary retention Outcome: Adequate for Discharge   Problem: Pain Managment: Goal: General experience of comfort will improve Outcome: Adequate for Discharge   Problem: Safety: Goal: Ability to remain free from injury will improve Outcome: Adequate for Discharge   Problem: Skin Integrity: Goal: Risk for impaired skin integrity will decrease Outcome: Adequate for Discharge

## 2022-09-11 NOTE — TOC Progression Note (Signed)
Transition of Care [facility]) - Progression Note    Patient Details  Name: Rachel Roth MRN: [id number] Date of Birth: 20-Jan-1966  Transition of Care [facility]) CM/SW Contact  Zenon Mayo, RN Phone Number: 09/11/2022, 8:54 AM  Clinical Narrative:    From home with spouse, c/p chest pain, Cards following, for cath.  TOC following.         Expected Discharge Plan and Services                                                 Social Determinants of Health (SDOH) Interventions    Readmission Risk Interventions     No data to display

## 2022-09-11 NOTE — Progress Notes (Signed)
CARDIAC REHAB PHASE I   PRE:  Rate/Rhythm: 37 SR   MODE:  Ambulation: 240 ft   POST:  Rate/Rhythm: 85 SR     Pt ambulated in hall independently with standby assist only. Tolerated well with no CP or SOB. Returned to bed with call bell and bedside table in reach. Reviewed education provided yesterday. All questions and concerns addressed. Will continue to follow.      [id number] Vanessa Barbara, RN BSN 09/11/2022 11:29 AM

## 2022-09-11 NOTE — Discharge Summary (Signed)
Physician Discharge Summary  Patient ID: Rachel Roth MRN: [id number] DOB/AGE: 55/30/1967 55 y.o.  Admit date: 09/09/2022 Discharge date: 09/11/2022  Admission Diagnoses: Acute inferior wall MI HTN Tobacco use disorder Arthritis Restless leg syndrome Anxiety and depression  Discharge Diagnoses:  Principal Problem:   Acute MI, anterior wall ([place]) Active Problems:   Spontaneous dissection of coronary artery   Hypertension   Tobacco use disorder   Arthritis   Restless leg syndrome   Anxiety and depression  Discharged Condition: fair  Hospital Course: 56 years old white female with PMH of HTN, Anxiety and depression, tobacco use disorder, arthritis and restless leg syndrome had burning type chest painafter breakfast in AM. Post 1 hour wait she decided to go home only to return around 5 PM with more chest pain. Her troponin I levels were elevated at 4639 ng and 24000 + ng. Earlier she had minimal EKG changes of what appeared to be inferior wall MI but post cardiac cath EKG showed anterior wall MI. Her coronaries showed normal RCA and LCX and diffuse narrowing of distal half of the LAD with suggestion of dissection hence no intervention was done. IV heparin was discontinued. She was monitored for additional 48 hours. Her right radial cath site was stable.  She discharged home on metoprolol, Isosorbide, amlodipine, Atorvastatin and Entresto for apical and mid and distal anterior wall hypo to dyskinesia.Her LV will be re-evaluated in 2-3 months. Until then she will refrain form lifting, pulling and pushing over 5 pounds. She may ambulate as tolerated. She will see primary care in 1 week and see me in 1 month.  Consults: cardiology/Dr. Adrian Prows  Significant Diagnostic Studies: labs: Normal CBC and near normal BMET. Lipid panel showed LDL of 112 mg., HDL of 35 mg. and triglyceride of 213 mg. Lipoprotein A was elevated at 79 nmol/L. Troponin I was 4639 ng, then over 24000 and today  down to 3909 ng.  EKG showed subtle inferior wall changes of possible MI, then showed anterior wall MI post catheterization.  Cardiac cath showed diffuse narrowing of distal half of LAD with possible spontaneous dissection and normal RCA and LCx coronary arteries.  Echocardiogram showed Apical dyskinesia and severe mid and distal anterior wall hypokinesia with LVEF of 50-55 %.  Treatments: Amlodipine, Atorvastatin, Isosorbide, Metoprolol and Entresto.  Discharge Exam: Blood pressure 112/65, pulse 60, temperature 98.1 F (36.7 C), temperature source Oral, resp. rate 16, height _0  (1.626 m), weight 75 kg, SpO2 91 %. General appearance: alert, cooperative and appears stated age. Head: Normocephalic, atraumatic. Eyes: Hazel eyes, pink conjunctiva, corneas clear.   Neck: No adenopathy, no carotid bruit, no JVD, supple, symmetrical, trachea midline and thyroid not enlarged. Resp: Clear to auscultation bilaterally. Cardio: Regular rate and rhythm, S1, S2 normal, II/VI systolic murmur, no click, rub or gallop. GI: Soft, non-tender; bowel sounds normal; no organomegaly. Extremities: No edema, cyanosis or clubbing. Right radial cath site is stable. Skin: Warm and dry.  Neurologic: Alert and oriented X 3, normal strength and tone. Normal coordination and gait.  Disposition: Discharge disposition: 01-Home or Self Care       Discharge Instructions     Amb Referral to Cardiac Rehabilitation   Complete by: As directed    Diagnosis: NSTEMI   After initial evaluation and assessments completed: Virtual Based Care may be provided alone or in conjunction with Phase 2 Cardiac Rehab based on patient barriers.: Yes   Intensive Cardiac Rehabilitation (ICR) [facility] location only OR Traditional Cardiac Rehabilitation (TCR) *  If criteria for ICR are not met will enroll in TCR [facility] only): Yes      Allergies as of 09/11/2022       Reactions   Codeine Nausea And Vomiting        Medication List      STOP taking these medications    cyclobenzaprine 5 MG tablet Commonly known as: FLEXERIL       TAKE these medications    acetaminophen 325 MG tablet Commonly known as: TYLENOL Take 325 mg by mouth every 6 (six) hours as needed for mild pain, fever or headache.   amLODipine 5 MG tablet Commonly known as: NORVASC TAKE 1 TABLET (5 MG TOTAL) BY MOUTH DAILY.   atorvastatin 80 MG tablet Commonly known as: LIPITOR Take 1 tablet (80 mg total) by mouth daily. Start taking on: September 12, 2022   busPIRone 5 MG tablet Commonly known as: BUSPAR TAKE 1 TABLET BY MOUTH TWICE A DAY AS NEEDED What changed: when to take this   gabapentin 300 MG capsule Commonly known as: NEURONTIN TAKE 1-2 CAPSULES (300-600 MG TOTAL) BY MOUTH AT BEDTIME. What changed: how much to take   isosorbide mononitrate 30 MG 24 hr tablet Commonly known as: IMDUR Take 1 tablet (30 mg total) by mouth daily. Start taking on: September 12, 2022   metoprolol tartrate 25 MG tablet Commonly known as: LOPRESSOR Take 0.5 tablets (12.5 mg total) by mouth 2 (two) times daily.   nitroGLYCERIN 0.4 MG SL tablet Commonly known as: NITROSTAT Place 1 tablet (0.4 mg total) under the tongue every 5 (five) minutes as needed for chest pain.   sacubitril-valsartan 24-26 MG Commonly known as: ENTRESTO Take 1 tablet by mouth 2 (two) times daily.   venlafaxine XR 75 MG 24 hr capsule Commonly known as: EFFEXOR-XR TAKE 3 CAPSULES (225 MG TOTAL) BY MOUTH DAILY WITH BREAKFAST.        Follow-up Information     Wendie Agreste, MD. Go on 09/23/2022.   Specialties: Family Medicine, Sports Medicine Why: _0 :40am Contact information: [address] [place] [address] [phone]                 Time spent: Review of old chart, current chart, lab, x-ray, cardiac tests and discussion with patient over 60 minutes.  Signed: Birdie Riddle 09/11/2022, 2:59 PM

## 2022-09-11 NOTE — TOC Transition Note (Addendum)
Transition of Care [facility]) - CM/SW Discharge Note   Patient Details  Name: Rachel Roth MRN: [id number] Date of Birth: 11-Mar-1966  Transition [facility]) CM/SW Contact:  Zenon Mayo, RN Phone Number: 09/11/2022, 4:07 PM   Clinical Narrative:    Patient is for dc today, patient left before NCM could see her.  She is on entresto, will call pharmacy to give them the coupon information for the 30 day free and will mail the 10 copay to her .  SWAT RN took patient out before NCM saw patient. NCM called the [facility] pharmacy to give them the information off the 30 day free coupon.  She put the information in and it went thru but they will not have it til Monday , pharmacist said to let her know to go to the one on [address].          Patient Goals and CMS Choice        Discharge Placement                       Discharge Plan and Services                                     Social Determinants of Health (SDOH) Interventions     Readmission Risk Interventions     No data to display

## 2022-09-18 ENCOUNTER — Telehealth (HOSPITAL_COMMUNITY): Payer: Self-pay

## 2022-09-18 NOTE — Telephone Encounter (Signed)
Attempted to call patient in regards to Cardiac Rehab - unable to leave VM, VM box full.

## 2022-09-18 NOTE — Telephone Encounter (Signed)
Called Dr. Doylene Canard office to see when if pt has/had her f/u appt, the office was closed.

## 2022-09-23 ENCOUNTER — Telehealth: Payer: Self-pay

## 2022-09-23 ENCOUNTER — Ambulatory Visit (INDEPENDENT_AMBULATORY_CARE_PROVIDER_SITE_OTHER): Payer: 59 | Admitting: Family Medicine

## 2022-09-23 ENCOUNTER — Other Ambulatory Visit (HOSPITAL_COMMUNITY): Payer: Self-pay

## 2022-09-23 ENCOUNTER — Encounter: Payer: Self-pay | Admitting: Family Medicine

## 2022-09-23 VITALS — BP 138/76 | HR 58 | Temp 98.4°F | Ht 64.0 in | Wt 167.0 lb

## 2022-09-23 DIAGNOSIS — I249 Acute ischemic heart disease, unspecified: Secondary | ICD-10-CM

## 2022-09-23 DIAGNOSIS — F419 Anxiety disorder, unspecified: Secondary | ICD-10-CM | POA: Diagnosis not present

## 2022-09-23 DIAGNOSIS — H538 Other visual disturbances: Secondary | ICD-10-CM

## 2022-09-23 DIAGNOSIS — I2542 Coronary artery dissection: Secondary | ICD-10-CM | POA: Diagnosis not present

## 2022-09-23 DIAGNOSIS — R2681 Unsteadiness on feet: Secondary | ICD-10-CM | POA: Diagnosis not present

## 2022-09-23 DIAGNOSIS — F32A Depression, unspecified: Secondary | ICD-10-CM

## 2022-09-23 LAB — CBC
HCT: 42.5 % (ref 36.0–46.0)
Hemoglobin: 14.3 g/dL (ref 12.0–15.0)
MCHC: 33.6 g/dL (ref 30.0–36.0)
MCV: 84.2 fl (ref 78.0–100.0)
Platelets: 318 10*3/uL (ref 150.0–400.0)
RBC: 5.05 Mil/uL (ref 3.87–5.11)
RDW: 13.4 % (ref 11.5–15.5)
WBC: 8.4 10*3/uL (ref 4.0–10.5)

## 2022-09-23 NOTE — Telephone Encounter (Signed)
Pharmacy Patient Advocate Encounter  Prior Authorization for Delene Loll has been approved.    PA# [id number] Effective dates: 09/23/2022 through 09/24/2023

## 2022-09-23 NOTE — Telephone Encounter (Signed)
Pharmacy Patient Advocate Encounter   Received notification from [employer] that prior authorization for Entresto 24-'26MG'$  tablets is required/requested.   PA submitted on 09/23/22 to (ins) [employer] via CoverMyMeds Key  [id number] Status is pending

## 2022-09-23 NOTE — Telephone Encounter (Signed)
Informed pt that the Rx Rachel Roth has been approved

## 2022-09-23 NOTE — Patient Instructions (Addendum)
Keep up the good work with quitting smoking.  I will defer work and restrictions to cardiology. Initial restrictions of 5 pounds lifting, pulling, pushing.  I will order CT of head and refer you to eye specialist for the blurry vision and unsteadiness.  Return to the clinic or go to the nearest emergency room if any of your symptoms worsen or new symptoms occur. Hang in there.    Managing the Challenge of Quitting Smoking Quitting smoking is a physical and mental challenge. You may have cravings, withdrawal symptoms, and temptation to smoke. Before quitting, work with your health care provider to make a plan that can help you manage quitting. Making a plan before you quit may keep you from smoking when you have the urge to smoke while trying to quit. How to manage lifestyle changes Managing stress Stress can make you want to smoke, and wanting to smoke may cause stress. It is important to find ways to manage your stress. You could try some of the following: Practice relaxation techniques. Breathe slowly and deeply, in through your nose and out through your mouth. Listen to music. Soak in a bath or take a shower. Imagine a peaceful place or vacation. Get some support. Talk with family or friends about your stress. Join a support group. Talk with a counselor or therapist. Get some physical activity. Go for a walk, run, or bike ride. Play a favorite sport. Practice yoga.  Medicines Talk with your health care provider about medicines that might help you deal with cravings and make quitting easier for you. Relationships Social situations can be difficult when you are quitting smoking. To manage this, you can: Avoid parties and other social situations where people might be smoking. Avoid alcohol. Leave right away if you have the urge to smoke. Explain to your family and friends that you are quitting smoking. Ask for support and let them know you might be a bit grumpy. Plan activities  where smoking is not an option. General instructions Be aware that many people gain weight after they quit smoking. However, not everyone does. To keep from gaining weight, have a plan in place before you quit, and stick to the plan after you quit. Your plan should include: Eating healthy snacks. When you have a craving, it may help to: Eat popcorn, or try carrots, celery, or other cut vegetables. Chew sugar-free gum. Changing how you eat. Eat small portion sizes at meals. Eat 4-6 small meals throughout the day instead of 1-2 large meals a day. Be mindful when you eat. You should avoid watching television or doing other things that might distract you as you eat. Exercising regularly. Make time to exercise each day. If you do not have time for a long workout, do short bouts of exercise for 5-10 minutes several times a day. Do some form of strengthening exercise, such as weight lifting. Do some exercise that gets your heart beating and causes you to breathe deeply, such as walking fast, running, swimming, or biking. This is very important. Drinking plenty of water or other low-calorie or no-calorie drinks. Drink enough fluid to keep your urine pale yellow.  How to recognize withdrawal symptoms Your body and mind may experience discomfort as you try to get used to not having nicotine in your system. These effects are called withdrawal symptoms. They may include: Feeling hungrier than normal. Having trouble concentrating. Feeling irritable or restless. Having trouble sleeping. Feeling depressed. Craving a cigarette. These symptoms may surprise you, but they are  normal to have when quitting smoking. To manage withdrawal symptoms: Avoid places, people, and activities that trigger your cravings. Remember why you want to quit. Get plenty of sleep. Avoid coffee and other drinks that contain caffeine. These may worsen some of your symptoms. How to manage cravings Come up with a plan for how to  deal with your cravings. The plan should include the following: A definition of the specific situation you want to deal with. An activity or action you will take to replace smoking. A clear idea for how this action will help. The name of someone who could help you with this. Cravings usually last for 5-10 minutes. Consider taking the following actions to help you with your plan to deal with cravings: Keep your mouth busy. Chew sugar-free gum. Suck on hard candies or a straw. Brush your teeth. Keep your hands and body busy. Change to a different activity right away. Squeeze or play with a ball. Do an activity or a hobby, such as making bead jewelry, practicing needlepoint, or working with wood. Mix up your normal routine. Take a short exercise break. Go for a quick walk, or run up and down stairs. Focus on doing something kind or helpful for someone else. Call a friend or family member to talk during a craving. Join a support group. Contact a quitline. Where to find support To get help or find a support group: Call the [facility]'s Smoking Quitline: 1-800-QUIT-NOW 249-680-2836) Text QUIT to SmokefreeTXT: 989211 Where to find more information Visit these websites to find more information on quitting smoking: U.S. Department of Health and Human Services: www.smokefree.gov American Lung Association: www.freedomfromsmoking.org Centers for Disease Control and Prevention (CDC): http://www.wolf.info/ American Heart Association: www.heart.org Contact a health care provider if: You want to change your plan for quitting. The medicines you are taking are not helping. Your eating feels out of control or you cannot sleep. You feel depressed or become very anxious. Summary Quitting smoking is a physical and mental challenge. You will face cravings, withdrawal symptoms, and temptation to smoke again. Preparation can help you as you go through these challenges. Try different techniques to  manage stress, handle social situations, and prevent weight gain. You can deal with cravings by keeping your mouth busy (such as by chewing gum), keeping your hands and body busy, calling family or friends, or contacting a quitline for people who want to quit smoking. You can deal with withdrawal symptoms by avoiding places where people smoke, getting plenty of rest, and avoiding drinks that contain caffeine. This information is not intended to replace advice given to you by your health care provider. Make sure you discuss any questions you have with your health care provider. Document Revised: 09/05/2021 Document Reviewed: 09/05/2021 Elsevier Patient Education  [place].

## 2022-09-23 NOTE — Progress Notes (Signed)
Subjective:  Patient ID: Rachel Roth, female    DOB: [birth year]  Age: 56 y.o. MRN: [id number]  CC:  Chief Complaint  Patient presents with   Hospitalization Follow-up    Pt had a heart attack on Dec 13   Depression    PHQ9 - 6    HPI Rachel Roth presents for   Hospital follow-up Admitted to [facility] December 13 through December 15  Acute coronary syndrome, acute MI,  Initially presented to ER with chest pain after breakfast.  December 13, returned later in the day with persistent pain, troponin levels 4639, 24,000. Cardiac cath indicated anterior wall MI.  Initial EKG indicated inferior wall MI.  Normal RCA, LCx and diffuse narrowing of distal half of LAD with suggestion of dissection so no intervention.  IV heparin was discontinued.  Monitored for additional 48 hours, cath site is stable.  Discharged on metoprolol, isosorbide, amlodipine, atorvastatin, Entresto for apical and mid and distal anterior wall hypo to dyskinesia.  Plan for recheck LV in 2 to 3 months.  Restrictions on lifting pulling and pushing over 5 pounds. Walking ok.  Doing alright. Some fatigue. No chest pains, slight soreness at times - reporducible on muscle/chest wall. Not like prior to eval in ER. No dyspnea, no fever. Had one episode of diarrhea then sweats few nights ago - resolved. Feeling ok now.  Does not have cardiology follow up. Planning on cardiac rehab.  Quit smoking - none since hospital   Trouble focusing in left eye during hospitalization. She noticed after heart cath. Few headaches at times - thought to be due to nitrate. Vision has improved, slight blurring att times.  Optho - requests referral. Unsteady at times since leaving hospital. No focal weakness, no slurred speech.  Home BP 144/84.   Depression: currently treated with Effexor XR 75 mg daily, BuSpar 5 mg twice daily as needed.  Restless leg syndrome treated with gabapentin. Feels like meds overall doing well. Would like to  do more but limited d/t recent illness. No SI/HI.  No alcohol.       09/23/2022    9:46 AM 06/18/2022   11:28 AM 07/02/2021    9:16 AM 01/29/2021    9:42 AM 07/31/2020    9:46 AM  Depression screen PHQ 2/9  Decreased Interest 2 0 2 1 0  Down, Depressed, Hopeless 1 0 0 0 0  PHQ - 2 Score 3 0 2 1 0  Altered sleeping 1 0 2 3   Tired, decreased energy 2 0 3 3   Change in appetite 0 0 0 0   Feeling bad or failure about yourself  0 0 0 0   Trouble concentrating 0 0 0 0   Moving slowly or fidgety/restless 0 0 0 0   Suicidal thoughts 0 0 0 0   PHQ-9 Score 6 0 7 7   Difficult doing work/chores    Somewhat difficult      History Patient Active Problem List   Diagnosis Date Noted   Acute MI, inferior wall ([place]) 09/09/2022   ACS (acute coronary syndrome) ([place]) 09/09/2022   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   Recurrent major depressive disorder, in partial remission ([place]) 04/12/2018   Arthritis 10/25/2016   RLS (restless legs syndrome) 03/19/2015   Lower back pain 07/09/2012   Hip pain 07/09/2012   Cervical radiculopathy 05/29/2012   Anxiety 05/29/2012   Insomnia 05/29/2012   BMI  31.0-31.9,adult 05/29/2012   Depression 12/17/2011   Past Medical History:  Diagnosis Date   Allergy    Arthritis    Past Surgical History:  Procedure Laterality Date   ABDOMINAL HYSTERECTOMY     CESAREAN SECTION     LEFT HEART CATH AND CORONARY ANGIOGRAPHY N/A 09/09/2022   Procedure: LEFT HEART CATH AND CORONARY ANGIOGRAPHY;  Surgeon: Adrian Prows, MD;  Location: [facility];  Service: Cardiovascular;  Laterality: N/A;   Allergies  Allergen Reactions   Codeine Nausea And Vomiting   Prior to Admission medications   Medication Sig Start Date End Date Taking? Authorizing Provider  acetaminophen (TYLENOL) 325 MG tablet Take 325 mg by mouth every 6 (six) hours as needed for mild pain, fever or headache.   Yes [provider]   amLODipine (NORVASC) 5 MG tablet TAKE 1 TABLET (5 MG TOTAL) BY MOUTH DAILY. 06/15/22  Yes Wendie Agreste, MD  atorvastatin (LIPITOR) 80 MG tablet Take 1 tablet (80 mg total) by mouth daily. 09/12/22  Yes Dixie Dials, MD  busPIRone (BUSPAR) 5 MG tablet TAKE 1 TABLET BY MOUTH TWICE A DAY AS NEEDED Patient taking differently: Take 5 mg by mouth 2 (two) times daily. 08/31/22  Yes Wendie Agreste, MD  gabapentin (NEURONTIN) 300 MG capsule TAKE 1-2 CAPSULES (300-600 MG TOTAL) BY MOUTH AT BEDTIME. Patient taking differently: Take 300 mg by mouth at bedtime. 06/30/22  Yes Wendie Agreste, MD  isosorbide mononitrate (IMDUR) 30 MG 24 hr tablet Take 1 tablet (30 mg total) by mouth daily. 09/12/22  Yes Dixie Dials, MD  metoprolol tartrate (LOPRESSOR) 25 MG tablet Take 0.5 tablets (12.5 mg total) by mouth 2 (two) times daily. 09/11/22  Yes Dixie Dials, MD  nitroGLYCERIN (NITROSTAT) 0.4 MG SL tablet Place 1 tablet (0.4 mg total) under the tongue every 5 (five) minutes as needed for chest pain. 09/11/22  Yes Dixie Dials, MD  sacubitril-valsartan (ENTRESTO) 24-26 MG Take 1 tablet by mouth 2 (two) times daily. 09/11/22  Yes Dixie Dials, MD  venlafaxine XR (EFFEXOR-XR) 75 MG 24 hr capsule TAKE 3 CAPSULES (225 MG TOTAL) BY MOUTH DAILY WITH BREAKFAST. 04/20/22  Yes Wendie Agreste, MD   Social History   Socioeconomic History   Marital status: Married    Spouse name: Not on file   Number of children: Not on file   Years of education: Not on file   Highest education level: Not on file  Occupational History   Not on file  Tobacco Use   Smoking status: Every Day    Packs/day: 0.50    Types: Cigarettes   Smokeless tobacco: Never  Substance and Sexual Activity   Alcohol use: No   Drug use: No   Sexual activity: Yes    Birth control/protection: None  Other Topics Concern   Not on file  Social History Narrative   Not on file   Social Determinants of Health   Financial Resource Strain: Not  on file  Food Insecurity: Not on file  Transportation Needs: Not on file  Physical Activity: Not on file  Stress: Not on file  Social Connections: Not on file  Intimate Partner Violence: Not on file    Review of Systems Per HPI  Objective:   Vitals:   09/23/22 0947  BP: 138/76  Pulse: (!) 58  Temp: 98.4 F (36.9 C)  SpO2: 97%  Weight: 167 lb (75.8 kg)  Height: '5\' 4"'$  (1.626 m)     Physical Exam Constitutional:  Appearance: She is well-developed.  HENT:     Head: Normocephalic and atraumatic.     Right Ear: External ear normal.     Left Ear: External ear normal.  Eyes:     Extraocular Movements: Extraocular movements intact.     Right eye: No nystagmus.     Left eye: No nystagmus.     Conjunctiva/sclera: Conjunctivae normal.     Pupils: Pupils are equal, round, and reactive to light.  Neck:     Thyroid: No thyromegaly.  Cardiovascular:     Rate and Rhythm: Normal rate and regular rhythm.     Heart sounds: Normal heart sounds. No murmur heard. Pulmonary:     Effort: Pulmonary effort is normal. No respiratory distress.     Breath sounds: Normal breath sounds. No wheezing.  Abdominal:     General: Bowel sounds are normal.     Palpations: Abdomen is soft.     Tenderness: There is no abdominal tenderness.  Musculoskeletal:        General: No tenderness. Normal range of motion.     Cervical back: Normal range of motion and neck supple.  Lymphadenopathy:     Cervical: No cervical adenopathy.  Skin:    General: Skin is warm and dry.     Findings: No rash.  Neurological:     General: No focal deficit present.     Mental Status: She is alert and oriented to person, place, and time.     GCS: GCS eye subscore is 4. GCS verbal subscore is 5. GCS motor subscore is 6.     Cranial Nerves: No cranial nerve deficit, dysarthria or facial asymmetry.     Sensory: No sensory deficit.     Motor: No weakness, tremor, abnormal muscle tone or pronator drift.      Coordination: Coordination is intact. Romberg sign negative. Coordination normal. Finger-Nose-Finger Test normal.     Gait: Gait is intact.  Psychiatric:        Behavior: Behavior normal.        Thought Content: Thought content normal.     Right wrist/forearm with faint faded ecchymosis, no erythema, no wounds visualized.  40 minutes spent during visit, including chart review, hospital record review, specialist notes, follow-up plan and discussion of additional concerns including visual symptoms in office today.  Also includes counseling and assimilation of information, exam, discussion of plan, and chart completion.   Assessment & Plan:  Rachel Roth is a 56 y.o. female . ACS (acute coronary syndrome) ([place]) - Plan: Ambulatory referral to Cardiology Spontaneous dissection of coronary artery - Plan: Ambulatory referral to Cardiology  -Stable since hospitalization.  Minimal chest wall discomfort reproducible pectoralis/chest wall only.  No other chest symptoms.  Tolerating current med regimen.  Some bradycardia, but will defer changes to cardiology as history of coronary artery dissection.  Referral placed for cardiology follow-up.  ER precautions given.  -Commended on smoking cessation, handout given on 1 manage and challenges of quitting.  Anxiety and depression - Plan: Ambulatory referral to Cardiology  -Overall stable, will continue same dose of meds for now with 26-monthfollow-up.  Suspect some component of adjustment disorder but denies any need for med changes at this time.  Blurring, left eye - Plan: Ambulatory referral to Cardiology, Ambulatory referral to Ophthalmology, CT HEAD WO CONTRAST (5MM) Unsteadiness - Plan: Ambulatory referral to Cardiology, CT HEAD WO CONTRAST (5MM), CBC  -Noted unilateral blurring of vision, left eye around time of cardiac catheterization, also reports  some unsteadiness at times, balance issues at times since coming home.  Reassuring exam in office.   Nonfocal.  Eye symptoms have improved but still some blurring on left.  Refer to ophthalmology, check CT head to rule out CVA, then further imaging if needed.  ER precautions.  No orders of the defined types were placed in this encounter.  Patient Instructions  Keep up the good work with quitting smoking.  I will defer work and restrictions to cardiology. Initial restrictions of 5 pounds lifting, pulling, pushing.  I will order CT of head and refer you to eye specialist for the blurry vision and unsteadiness.      Signed,   Merri Ray, MD [place], [facility] 09/23/22 10:47 AM

## 2022-10-17 ENCOUNTER — Other Ambulatory Visit: Payer: Self-pay | Admitting: Family Medicine

## 2022-10-17 DIAGNOSIS — G2581 Restless legs syndrome: Secondary | ICD-10-CM

## 2022-11-27 ENCOUNTER — Telehealth: Payer: Self-pay | Admitting: Family Medicine

## 2022-11-27 DIAGNOSIS — I2542 Coronary artery dissection: Secondary | ICD-10-CM

## 2022-11-27 DIAGNOSIS — I249 Acute ischemic heart disease, unspecified: Secondary | ICD-10-CM

## 2022-11-27 NOTE — Telephone Encounter (Signed)
Patient request referral to Dr. Harrell Gave.  Referral placed.  See last note.

## 2022-11-27 NOTE — Telephone Encounter (Signed)
It appears a referral was placed back in December, okay to see different cardiologist if she would prefer. Do I need to order a new referral  or can we leave the previous referral in place and just change provider?

## 2022-11-27 NOTE — Telephone Encounter (Signed)
Do we have to edit the referral from December to see this new Dr or need a new order?

## 2022-11-27 NOTE — Telephone Encounter (Signed)
Are you okay with switching the referral at this time or should the patient come back in to discuss this?

## 2022-11-27 NOTE — Telephone Encounter (Signed)
Caller name: GRAYCE SANTILLANES  On DPR?: Yes  Call back number: [phone] (home)  Provider they see: Wendie Agreste, MD  Reason for call:pt would like referral to heart doctor   Bridgette Ruthell Rummage, MD

## 2022-11-30 ENCOUNTER — Other Ambulatory Visit: Payer: Self-pay | Admitting: Family Medicine

## 2022-11-30 DIAGNOSIS — F419 Anxiety disorder, unspecified: Secondary | ICD-10-CM

## 2022-11-30 DIAGNOSIS — F3341 Major depressive disorder, recurrent, in partial remission: Secondary | ICD-10-CM

## 2022-12-04 ENCOUNTER — Other Ambulatory Visit: Payer: Self-pay | Admitting: Family Medicine

## 2022-12-04 DIAGNOSIS — I10 Essential (primary) hypertension: Secondary | ICD-10-CM

## 2022-12-17 ENCOUNTER — Other Ambulatory Visit: Payer: Self-pay | Admitting: Family Medicine

## 2022-12-17 DIAGNOSIS — F32A Depression, unspecified: Secondary | ICD-10-CM

## 2022-12-17 DIAGNOSIS — F3341 Major depressive disorder, recurrent, in partial remission: Secondary | ICD-10-CM

## 2022-12-23 ENCOUNTER — Ambulatory Visit: Payer: 59 | Admitting: Family Medicine

## 2022-12-23 ENCOUNTER — Encounter: Payer: Self-pay | Admitting: Family Medicine

## 2022-12-23 VITALS — BP 132/80 | HR 88 | Temp 98.2°F | Ht 64.0 in | Wt 175.8 lb

## 2022-12-23 DIAGNOSIS — I249 Acute ischemic heart disease, unspecified: Secondary | ICD-10-CM | POA: Diagnosis not present

## 2022-12-23 DIAGNOSIS — R5383 Other fatigue: Secondary | ICD-10-CM | POA: Diagnosis not present

## 2022-12-23 DIAGNOSIS — I2542 Coronary artery dissection: Secondary | ICD-10-CM | POA: Diagnosis not present

## 2022-12-23 DIAGNOSIS — F32A Depression, unspecified: Secondary | ICD-10-CM

## 2022-12-23 DIAGNOSIS — F419 Anxiety disorder, unspecified: Secondary | ICD-10-CM

## 2022-12-23 DIAGNOSIS — R002 Palpitations: Secondary | ICD-10-CM | POA: Diagnosis not present

## 2022-12-23 LAB — COMPREHENSIVE METABOLIC PANEL
ALT: 17 U/L (ref 0–35)
AST: 15 U/L (ref 0–37)
Albumin: 4.4 g/dL (ref 3.5–5.2)
Alkaline Phosphatase: 134 U/L — ABNORMAL HIGH (ref 39–117)
BUN: 11 mg/dL (ref 6–23)
CO2: 29 mEq/L (ref 19–32)
Calcium: 9.2 mg/dL (ref 8.4–10.5)
Chloride: 104 mEq/L (ref 96–112)
Creatinine, Ser: 0.66 mg/dL (ref 0.40–1.20)
GFR: 98.31 mL/min (ref >60.00)
Glucose, Bld: 98 mg/dL (ref 70–99)
Potassium: 4.2 mEq/L (ref 3.5–5.1)
Sodium: 140 mEq/L (ref 135–145)
Total Bilirubin: 0.5 mg/dL (ref 0.2–1.2)
Total Protein: 7 g/dL (ref 6.0–8.3)

## 2022-12-23 LAB — CBC
HCT: 40.7 % (ref 36.0–46.0)
Hemoglobin: 13.6 g/dL (ref 12.0–15.0)
MCHC: 33.4 g/dL (ref 30.0–36.0)
MCV: 83.2 fl (ref 78.0–100.0)
Platelets: 305 10*3/uL (ref 150.0–400.0)
RBC: 4.89 Mil/uL (ref 3.87–5.11)
RDW: 15 % (ref 11.5–15.5)
WBC: 8.8 10*3/uL (ref 4.0–10.5)

## 2022-12-23 LAB — LIPID PANEL
Cholesterol: 141 mg/dL (ref 0–200)
HDL: 47.8 mg/dL (ref >39.00)
LDL Cholesterol: 67 mg/dL (ref 0–99)
NonHDL: 93.35
Total CHOL/HDL Ratio: 3
Triglycerides: 132 mg/dL (ref 0.0–149.0)
VLDL: 26.4 mg/dL (ref 0.0–40.0)

## 2022-12-23 LAB — TSH: TSH: 2.08 u[IU]/mL (ref 0.35–5.50)

## 2022-12-23 NOTE — Progress Notes (Unsigned)
Subjective:  Patient ID: Rachel Roth, female    DOB: June 03, 1966  Age: 57 y.o. MRN: [id number]  CC:  Chief Complaint  Patient presents with   Depression   Fatigue    Pt states she has been feeling very tired since having her heart attack.     HPI [name] presents for   Cardiac Acute anterior wall MI in December.  See last visit.  Was discharged on metoprolol, isosorbide, amlodipine, atorvastatin, Entresto.  Was noted to have apical and mid, distal anterior wall hypodyskinesia, with plan for recheck LV in 2 to 3 months.  She was having some fatigue at her visit with me in December after her hospitalization.  Was followed up with Dr. Doylene Canard, has not undergone cardiac rehab.  Has not undergone cardiac rehab. Recently requested different cardiologist.  Appointment scheduled April 10 with Dr. Harrell Gave. Soreness in chest only with arm movement at times. No other chest pain/pressure. No dyspnea.  Hear skips beats at times - hard beat at times, not racing. Few times per day. Catches breath temporarily, infrequent.  Still feels fatigue - about the same. Thinks due to less activity.  Caffeine:3-4 per day.  Quit smoking. Still doing well.   Also noted at her December visit to have some blurry vision and unsteadiness at times, CT head was ordered but not performed.  Referred to ophthalmology.  Discussed with cardiology - did not think needed CT. Feels better.  Vision doing better. Saw optho- few small abnormalities in R eye - followed by other specialist - monitoring at this time.  Fasting today  Depression: Treated with Effexor 75 mg daily, BuSpar 5 mg twice daily as needed.  Restless leg syndrome.  Gabapentin.  Reported overall stable symptoms in December, limited activity at that time due to her recent illness.  She has had persistent fatigue. Feels overall stable with current meds.      12/23/2022    8:41 AM 09/23/2022    9:46 AM 06/18/2022   11:28 AM 07/02/2021     9:16 AM 01/29/2021    9:42 AM  Depression screen PHQ 2/9  Decreased Interest 0 2 0 2 1  Down, Depressed, Hopeless 0 1 0 0 0  PHQ - 2 Score 0 3 0 2 1  Altered sleeping 1 1 0 2 3  Tired, decreased energy 1 2 0 3 3  Change in appetite 0 0 0 0 0  Feeling bad or failure about yourself  0 0 0 0 0  Trouble concentrating 0 0 0 0 0  Moving slowly or fidgety/restless 0 0 0 0 0  Suicidal thoughts 0 0 0 0 0  PHQ-9 Score 2 6 0 7 7  Difficult doing work/chores Not difficult at all    Somewhat difficult     History Patient Active Problem List   Diagnosis Date Noted   Acute MI, inferior wall ([place]) 09/09/2022   ACS (acute coronary syndrome) ([place]) 09/09/2022   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   Recurrent major depressive disorder, in partial remission ([place]) 04/12/2018   Arthritis 10/25/2016   RLS (restless legs syndrome) 03/19/2015   Lower back pain 07/09/2012   Hip pain 07/09/2012   Cervical radiculopathy 05/29/2012   Anxiety 05/29/2012   Insomnia 05/29/2012   BMI 31.0-31.9,adult 05/29/2012   Depression 12/17/2011   Past Medical History:  Diagnosis Date   Allergy    Arthritis    Past Surgical History:  Procedure Laterality Date   ABDOMINAL HYSTERECTOMY     CESAREAN SECTION     LEFT HEART CATH AND CORONARY ANGIOGRAPHY N/A 09/09/2022   Procedure: LEFT HEART CATH AND CORONARY ANGIOGRAPHY;  Surgeon: Adrian Prows, MD;  Location: [facility];  Service: Cardiovascular;  Laterality: N/A;   TOTAL ABDOMINAL HYSTERECTOMY     Allergies  Allergen Reactions   Codeine Nausea And Vomiting   Prior to Admission medications   Medication Sig Start Date End Date Taking? Authorizing Provider  acetaminophen (TYLENOL) 325 MG tablet Take 325 mg by mouth every 6 (six) hours as needed for mild pain, fever or headache.   Yes [provider]  amLODipine (NORVASC) 5 MG tablet TAKE 1 TABLET (5 MG TOTAL) BY MOUTH DAILY. 12/04/22  Yes  Wendie Agreste, MD  atorvastatin (LIPITOR) 80 MG tablet Take 1 tablet (80 mg total) by mouth daily. 09/12/22  Yes Dixie Dials, MD  busPIRone (BUSPAR) 5 MG tablet TAKE 1 TABLET BY MOUTH TWICE A DAY AS NEEDED Patient taking differently: Take 5 mg by mouth 2 (two) times daily. 08/31/22  Yes Wendie Agreste, MD  gabapentin (NEURONTIN) 300 MG capsule TAKE 1-2 CAPSULES (300-600 MG TOTAL) BY MOUTH AT BEDTIME. 10/19/22  Yes Wendie Agreste, MD  isosorbide mononitrate (IMDUR) 30 MG 24 hr tablet Take 1 tablet (30 mg total) by mouth daily. 09/12/22  Yes Dixie Dials, MD  metoprolol tartrate (LOPRESSOR) 25 MG tablet Take 0.5 tablets (12.5 mg total) by mouth 2 (two) times daily. 09/11/22  Yes Dixie Dials, MD  nitroGLYCERIN (NITROSTAT) 0.4 MG SL tablet Place 1 tablet (0.4 mg total) under the tongue every 5 (five) minutes as needed for chest pain. 09/11/22  Yes Dixie Dials, MD  sacubitril-valsartan (ENTRESTO) 24-26 MG Take 1 tablet by mouth 2 (two) times daily. 09/11/22  Yes Dixie Dials, MD  venlafaxine XR (EFFEXOR-XR) 75 MG 24 hr capsule TAKE 3 CAPSULES (225 MG TOTAL) BY MOUTH DAILY WITH BREAKFAST 12/18/22  Yes Wendie Agreste, MD   Social History   Socioeconomic History   Marital status: Married    Spouse name: Not on file   Number of children: Not on file   Years of education: Not on file   Highest education level: Not on file  Occupational History   Not on file  Tobacco Use   Smoking status: Every Day    Packs/day: .5    Types: Cigarettes   Smokeless tobacco: Never  Substance and Sexual Activity   Alcohol use: No   Drug use: No   Sexual activity: Yes    Birth control/protection: None  Other Topics Concern   Not on file  Social History Narrative   Not on file   Social Determinants of Health   Financial Resource Strain: Not on file  Food Insecurity: Not on file  Transportation Needs: Not on file  Physical Activity: Not on file  Stress: Not on file  Social Connections: Not  on file  Intimate Partner Violence: Not on file    Review of Systems Per HPI.   Objective:   Vitals:   12/23/22 0842  BP: 132/80  Pulse: 88  Temp: 98.2 F (36.8 C)  TempSrc: Temporal  SpO2: 97%  Weight: 175 lb 12.8 oz (79.7 kg)  Height: 5\' 4"  (1.626 m)     Physical Exam Vitals reviewed.  Constitutional:      Appearance: Normal appearance. She is well-developed.  HENT:     Head: Normocephalic and atraumatic.  Eyes:  Conjunctiva/sclera: Conjunctivae normal.     Pupils: Pupils are equal, round, and reactive to light.  Neck:     Vascular: No carotid bruit.  Cardiovascular:     Rate and Rhythm: Normal rate and regular rhythm.     Heart sounds: Normal heart sounds.  Pulmonary:     Effort: Pulmonary effort is normal.     Breath sounds: Normal breath sounds.  Abdominal:     Palpations: Abdomen is soft. There is no pulsatile mass.     Tenderness: There is no abdominal tenderness.  Musculoskeletal:     Right lower leg: No edema.     Left lower leg: No edema.  Skin:    General: Skin is warm and dry.  Neurological:     Mental Status: She is alert and oriented to person, place, and time.  Psychiatric:        Mood and Affect: Mood normal.        Behavior: Behavior normal.    EKG: Sinus rhythm, junctional rhythm per EKG.  Rate 59.  No apparent significant changes compared to 09/09/2022.  No ectopy.  Assessment & Plan:  LINETTE OSTERMEYER is a 57 y.o. female . ACS (acute coronary syndrome) ([place]) - Plan: Comprehensive metabolic panel, Lipid panel  Spontaneous dissection of coronary artery  Anxiety and depression  Fatigue, unspecified type - Plan: Comprehensive metabolic panel, CBC, TSH  Palpitations - Plan: CBC, TSH, EKG 12-Lead  Status postacute coronary syndrome, dissection of coronary artery as above, has not yet completed cardiac rehab, does report follow-up with previous cardiologist, unable to see those notes at this time.  Plan for follow-up with new  cardiologist in the next few weeks.  Recommended she discuss level of activity, and need for cardiac rehab.  Also discussed fatigue and palpitations at that time, but will check CBC, TSH, and recommended decrease caffeine for now.  No apparent acute EKG findings.  Handout given on palpitations as well as fatigue with RTC/ER precautions given.  Anxiety/depression stable, no med changes for now, 45-month follow-up with RTC precautions if new or worsening symptoms.  No orders of the defined types were placed in this encounter.  Patient Instructions  Discuss activity, fatigue and need for cardiac rehab at upcoming cariology appointment. I will check some labs and EKG today. If any worsening palpitations be seen. Continue to cut back on caffeine.  See information below on palpitations and fatigue. I will check some labs, but I did not change any medications today.  Follow-up with me in 6 months, but I am happy to see you sooner if any new or worsening symptoms.  Take care!  Fatigue If you have fatigue, you feel tired all the time and have a lack of energy or a lack of motivation. Fatigue may make it difficult to start or complete tasks because of exhaustion. Occasional or mild fatigue is often a normal response to activity or life. However, long-term (chronic) or extreme fatigue may be a symptom of a medical condition such as: Depression. Not having enough red blood cells or hemoglobin in the blood (anemia). A problem with a small gland located in the lower front part of the neck (thyroid disorder). Rheumatologic conditions. These are problems related to the body's defense system (immune system). Infections, especially certain viral infections. Fatigue can also lead to negative health outcomes over time. Follow these instructions at home: Medicines Take over-the-counter and prescription medicines only as told by your health care provider. Take a multivitamin if told by  your health care provider. Do  not use herbal or dietary supplements unless they are approved by your health care provider. Eating and drinking  Avoid heavy meals in the evening. Eat a well-balanced diet, which includes lean proteins, whole grains, plenty of fruits and vegetables, and low-fat dairy products. Avoid eating or drinking too many products with caffeine in them. Avoid alcohol. Drink enough fluid to keep your urine pale yellow. Activity  Exercise regularly, as told by your health care provider. Use or practice techniques to help you relax, such as yoga, tai chi, meditation, or massage therapy. Lifestyle Change situations that cause you stress. Try to keep your work and personal schedules in balance. Do not use recreational or illegal drugs. General instructions Monitor your fatigue for any changes. Go to bed and get up at the same time every day. Avoid fatigue by pacing yourself during the day and getting enough sleep at night. Maintain a healthy weight. Contact a health care provider if: Your fatigue does not get better. You have a fever. You suddenly lose or gain weight. You have headaches. You have trouble falling asleep or sleeping through the night. You feel angry, guilty, anxious, or sad. You have swelling in your legs or another part of your body. Get help right away if: You feel confused, feel like you might faint, or faint. Your vision is blurry or you have a severe headache. You have severe pain in your abdomen, your back, or the area between your waist and hips (pelvis). You have chest pain, shortness of breath, or an irregular or fast heartbeat. You are unable to urinate, or you urinate less than normal. You have abnormal bleeding from the rectum, nose, lungs, nipples, or, if you are female, the vagina. You vomit blood. You have thoughts about hurting yourself or others. These symptoms may be an emergency. Get help right away. Call 911. Do not wait to see if the symptoms will go  away. Do not drive yourself to the hospital. Get help right away if you feel like you may hurt yourself or others, or have thoughts about taking your own life. Go to your nearest emergency room or: Call 911. Call the [facility] at [phone] or 988. This is open 24 hours a day. Text the Crisis Text Line at 681-249-3976. Summary If you have fatigue, you feel tired all the time and have a lack of energy or a lack of motivation. Fatigue may make it difficult to start or complete tasks because of exhaustion. Long-term (chronic) or extreme fatigue may be a symptom of a medical condition. Exercise regularly, as told by your health care provider. Change situations that cause you stress. Try to keep your work and personal schedules in balance. This information is not intended to replace advice given to you by your health care provider. Make sure you discuss any questions you have with your health care provider. Document Revised: 07/07/2021 Document Reviewed: 07/07/2021 Elsevier Patient Education  [place]. Palpitations Palpitations are feelings that your heartbeat is irregular or is faster than normal. It may feel like your heart is fluttering or skipping a beat. Palpitations may be caused by many things, including smoking, caffeine, alcohol, stress, and certain medicines or drugs. Most causes of palpitations are not serious.  However, some palpitations can be a sign of a serious problem. Further tests and a thorough medical history will be done to find the cause of your palpitations. Your provider may order tests such as an ECG,  labs, an echocardiogram, or an ambulatory continuous ECG monitor. Follow these instructions at home: Pay attention to any changes in your symptoms. Let your health care provider know about them. Take these actions to help manage your symptoms: Eating and drinking Follow instructions from your health care provider about eating or drinking  restrictions. You may need to avoid foods and drinks that may cause palpitations. These may include: Caffeinated coffee, tea, soft drinks, and energy drinks. Chocolate. Alcohol. Diet pills. Lifestyle     Take steps to reduce your stress and anxiety. Things that can help you relax include: Yoga. Mind-body activities, such as deep breathing, meditation, or using words and images to create positive thoughts (guided imagery). Physical activity, such as swimming, jogging, or walking. Tell your health care provider if your palpitations increase with activity. If you have chest pain or shortness of breath with activity, do not continue the activity until you are seen by your health care provider. Biofeedback. This is a method that helps you learn to use your mind to control things in your body, such as your heartbeat. Get plenty of rest and sleep. Keep a regular bed time. Do not use drugs, including cocaine or ecstasy. Do not use marijuana. Do not use any products that contain nicotine or tobacco. These products include cigarettes, chewing tobacco, and vaping devices, such as e-cigarettes. If you need help quitting, ask your health care provider. General instructions Take over-the-counter and prescription medicines only as told by your health care provider. Keep all follow-up visits. This is important. These may include visits for further testing if palpitations do not go away or get worse. Contact a health care provider if: You continue to have a fast or irregular heartbeat for a long period of time. You notice that your palpitations occur more often. Get help right away if: You have chest pain or shortness of breath. You have a severe headache. You feel dizzy or you faint. These symptoms may represent a serious problem that is an emergency. Do not wait to see if the symptoms will go away. Get medical help right away. Call your local emergency services (911 in the [place] Do not drive yourself to  the hospital. Summary Palpitations are feelings that your heartbeat is irregular or is faster than normal. It may feel like your heart is fluttering or skipping a beat. Palpitations may be caused by many things, including smoking, caffeine, alcohol, stress, certain medicines, and drugs. Further tests and a thorough medical history may be done to find the cause of your palpitations. Get help right away if you faint or have chest pain, shortness of breath, severe headache, or dizziness. This information is not intended to replace advice given to you by your health care provider. Make sure you discuss any questions you have with your health care provider. Document Revised: 02/05/2021 Document Reviewed: 02/05/2021 Elsevier Patient Education  [place],   Merri Ray, MD Mariano Colon, [facility] 12/23/22 9:22 AM

## 2022-12-23 NOTE — Patient Instructions (Addendum)
Discuss activity, fatigue and need for cardiac rehab at upcoming cariology appointment. I will check some labs and EKG today. If any worsening palpitations be seen. Continue to cut back on caffeine.  See information below on palpitations and fatigue. I will check some labs, but I did not change any medications today.  Follow-up with me in 6 months, but I am happy to see you sooner if any new or worsening symptoms.  Take care!  Fatigue If you have fatigue, you feel tired all the time and have a lack of energy or a lack of motivation. Fatigue may make it difficult to start or complete tasks because of exhaustion. Occasional or mild fatigue is often a normal response to activity or life. However, long-term (chronic) or extreme fatigue may be a symptom of a medical condition such as: Depression. Not having enough red blood cells or hemoglobin in the blood (anemia). A problem with a small gland located in the lower front part of the neck (thyroid disorder). Rheumatologic conditions. These are problems related to the body's defense system (immune system). Infections, especially certain viral infections. Fatigue can also lead to negative health outcomes over time. Follow these instructions at home: Medicines Take over-the-counter and prescription medicines only as told by your health care provider. Take a multivitamin if told by your health care provider. Do not use herbal or dietary supplements unless they are approved by your health care provider. Eating and drinking  Avoid heavy meals in the evening. Eat a well-balanced diet, which includes lean proteins, whole grains, plenty of fruits and vegetables, and low-fat dairy products. Avoid eating or drinking too many products with caffeine in them. Avoid alcohol. Drink enough fluid to keep your urine pale yellow. Activity  Exercise regularly, as told by your health care provider. Use or practice techniques to help you relax, such as yoga, tai chi,  meditation, or massage therapy. Lifestyle Change situations that cause you stress. Try to keep your work and personal schedules in balance. Do not use recreational or illegal drugs. General instructions Monitor your fatigue for any changes. Go to bed and get up at the same time every day. Avoid fatigue by pacing yourself during the day and getting enough sleep at night. Maintain a healthy weight. Contact a health care provider if: Your fatigue does not get better. You have a fever. You suddenly lose or gain weight. You have headaches. You have trouble falling asleep or sleeping through the night. You feel angry, guilty, anxious, or sad. You have swelling in your legs or another part of your body. Get help right away if: You feel confused, feel like you might faint, or faint. Your vision is blurry or you have a severe headache. You have severe pain in your abdomen, your back, or the area between your waist and hips (pelvis). You have chest pain, shortness of breath, or an irregular or fast heartbeat. You are unable to urinate, or you urinate less than normal. You have abnormal bleeding from the rectum, nose, lungs, nipples, or, if you are female, the vagina. You vomit blood. You have thoughts about hurting yourself or others. These symptoms may be an emergency. Get help right away. Call 911. Do not wait to see if the symptoms will go away. Do not drive yourself to the hospital. Get help right away if you feel like you may hurt yourself or others, or have thoughts about taking your own life. Go to your nearest emergency room or: Call 911. Call the [facility]  Suicide Prevention Lifeline at (260)777-8076 or 8. This is open 24 hours a day. Text the Crisis Text Line at 623-838-7672. Summary If you have fatigue, you feel tired all the time and have a lack of energy or a lack of motivation. Fatigue may make it difficult to start or complete tasks because of exhaustion. Long-term (chronic) or  extreme fatigue may be a symptom of a medical condition. Exercise regularly, as told by your health care provider. Change situations that cause you stress. Try to keep your work and personal schedules in balance. This information is not intended to replace advice given to you by your health care provider. Make sure you discuss any questions you have with your health care provider. Document Revised: 07/07/2021 Document Reviewed: 07/07/2021 Elsevier Patient Education  [place]. Palpitations Palpitations are feelings that your heartbeat is irregular or is faster than normal. It may feel like your heart is fluttering or skipping a beat. Palpitations may be caused by many things, including smoking, caffeine, alcohol, stress, and certain medicines or drugs. Most causes of palpitations are not serious.  However, some palpitations can be a sign of a serious problem. Further tests and a thorough medical history will be done to find the cause of your palpitations. Your provider may order tests such as an ECG, labs, an echocardiogram, or an ambulatory continuous ECG monitor. Follow these instructions at home: Pay attention to any changes in your symptoms. Let your health care provider know about them. Take these actions to help manage your symptoms: Eating and drinking Follow instructions from your health care provider about eating or drinking restrictions. You may need to avoid foods and drinks that may cause palpitations. These may include: Caffeinated coffee, tea, soft drinks, and energy drinks. Chocolate. Alcohol. Diet pills. Lifestyle     Take steps to reduce your stress and anxiety. Things that can help you relax include: Yoga. Mind-body activities, such as deep breathing, meditation, or using words and images to create positive thoughts (guided imagery). Physical activity, such as swimming, jogging, or walking. Tell your health care provider if your palpitations increase with activity.  If you have chest pain or shortness of breath with activity, do not continue the activity until you are seen by your health care provider. Biofeedback. This is a method that helps you learn to use your mind to control things in your body, such as your heartbeat. Get plenty of rest and sleep. Keep a regular bed time. Do not use drugs, including cocaine or ecstasy. Do not use marijuana. Do not use any products that contain nicotine or tobacco. These products include cigarettes, chewing tobacco, and vaping devices, such as e-cigarettes. If you need help quitting, ask your health care provider. General instructions Take over-the-counter and prescription medicines only as told by your health care provider. Keep all follow-up visits. This is important. These may include visits for further testing if palpitations do not go away or get worse. Contact a health care provider if: You continue to have a fast or irregular heartbeat for a long period of time. You notice that your palpitations occur more often. Get help right away if: You have chest pain or shortness of breath. You have a severe headache. You feel dizzy or you faint. These symptoms may represent a serious problem that is an emergency. Do not wait to see if the symptoms will go away. Get medical help right away. Call your local emergency services (911 in the [place] Do not drive yourself to the  hospital. Summary Palpitations are feelings that your heartbeat is irregular or is faster than normal. It may feel like your heart is fluttering or skipping a beat. Palpitations may be caused by many things, including smoking, caffeine, alcohol, stress, certain medicines, and drugs. Further tests and a thorough medical history may be done to find the cause of your palpitations. Get help right away if you faint or have chest pain, shortness of breath, severe headache, or dizziness. This information is not intended to replace advice given to you by your  health care provider. Make sure you discuss any questions you have with your health care provider. Document Revised: 02/05/2021 Document Reviewed: 02/05/2021 Elsevier Patient Education  [place].

## 2022-12-24 ENCOUNTER — Encounter: Payer: Self-pay | Admitting: Family Medicine

## 2023-01-01 ENCOUNTER — Emergency Department (HOSPITAL_COMMUNITY): Payer: 59

## 2023-01-01 ENCOUNTER — Emergency Department (HOSPITAL_COMMUNITY)
Admission: EM | Admit: 2023-01-01 | Discharge: 2023-01-01 | Disposition: A | Discharge status: Home / Self Care | Payer: 59 | Attending: Emergency Medicine | Admitting: Emergency Medicine

## 2023-01-01 ENCOUNTER — Encounter (HOSPITAL_COMMUNITY): Payer: Self-pay

## 2023-01-01 DIAGNOSIS — E876 Hypokalemia: Secondary | ICD-10-CM | POA: Diagnosis not present

## 2023-01-01 DIAGNOSIS — R072 Precordial pain: Secondary | ICD-10-CM | POA: Insufficient documentation

## 2023-01-01 DIAGNOSIS — I2510 Atherosclerotic heart disease of native coronary artery without angina pectoris: Secondary | ICD-10-CM | POA: Diagnosis not present

## 2023-01-01 DIAGNOSIS — R0789 Other chest pain: Secondary | ICD-10-CM | POA: Diagnosis present

## 2023-01-01 DIAGNOSIS — F17200 Nicotine dependence, unspecified, uncomplicated: Secondary | ICD-10-CM | POA: Insufficient documentation

## 2023-01-01 LAB — COMPREHENSIVE METABOLIC PANEL
ALT: 21 U/L (ref 0–44)
AST: 22 U/L (ref 15–41)
Albumin: 4 g/dL (ref 3.5–5.0)
Alkaline Phosphatase: 129 U/L — ABNORMAL HIGH (ref 38–126)
Anion gap: 17 — ABNORMAL HIGH (ref 5–15)
BUN: 7 mg/dL (ref 6–20)
CO2: 24 mmol/L (ref 22–32)
Calcium: 9.2 mg/dL (ref 8.9–10.3)
Chloride: 101 mmol/L (ref 98–111)
Creatinine, Ser: 0.75 mg/dL (ref 0.44–1.00)
GFR, Estimated: >60 mL/min (ref >60)
Glucose, Bld: 89 mg/dL (ref 70–99)
Potassium: 3.1 mmol/L — ABNORMAL LOW (ref 3.5–5.1)
Sodium: 142 mmol/L (ref 135–145)
Total Bilirubin: 0.7 mg/dL (ref 0.3–1.2)
Total Protein: 6.8 g/dL (ref 6.5–8.1)

## 2023-01-01 LAB — CBC
HCT: 40 % (ref 36.0–46.0)
Hemoglobin: 13.4 g/dL (ref 12.0–15.0)
MCH: 28.1 pg (ref 26.0–34.0)
MCHC: 33.5 g/dL (ref 30.0–36.0)
MCV: 83.9 fL (ref 80.0–100.0)
Platelets: 292 10*3/uL (ref 150–400)
RBC: 4.77 MIL/uL (ref 3.87–5.11)
RDW: 14 % (ref 11.5–15.5)
WBC: 8.3 10*3/uL (ref 4.0–10.5)
nRBC: 0 % (ref 0.0–0.2)

## 2023-01-01 LAB — TROPONIN I (HIGH SENSITIVITY)
Troponin I (High Sensitivity): 3 ng/L (ref <18)
Troponin I (High Sensitivity): 4 ng/L (ref <18)

## 2023-01-01 LAB — LIPASE, BLOOD: Lipase: 26 U/L (ref 11–51)

## 2023-01-01 MED ORDER — METOPROLOL TARTRATE 25 MG PO TABS: 12.5000 mg | ORAL_TABLET | Freq: Two times a day (BID) | ORAL | 1 refills | Status: DC

## 2023-01-01 MED ORDER — POTASSIUM CHLORIDE CRYS ER 20 MEQ PO TBCR: 20.0000 meq | EXTENDED_RELEASE_TABLET | Freq: Two times a day (BID) | ORAL | 0 refills | Status: DC

## 2023-01-01 NOTE — ED Notes (Signed)
Pt to xray

## 2023-01-01 NOTE — ED Notes (Signed)
Repeat troponin sent (2nd) (green, lavender and blue tube sent)

## 2023-01-01 NOTE — ED Provider Notes (Signed)
[facility] Provider [facility]   CSN: [id number] Arrival date & time: 01/01/23  1132     History  Chief Complaint  Patient presents with   Chest Pain    Rachel Roth is a 57 y.o. female.  Patient presenting with intermittent chest pain kind of left and right bilateral actually for several weeks.  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  This occurred September 11, 2022.  Patient also has either lost or not taking her Lopressor.  Patient was followed by Dr. Benay Pillowontact but now switching [facility].  Has an appointment with them on Wednesday.  Patient arrives here today by EMS from work with complaint of sudden onset of chest pain at approximately 9 in the morning the patient says he is really been having pain intermittently pain was 4 out of 10 radiating towards right arm but at times had pain on the left side this radiated to the left arm.  EMS gave her aspirin and 1 sublingual nitro no significant relief.  Temp was 99 blood pressure 152/84 oxygen sats 98% on room air heart rate 78 cardiac monitor without any arrhythmias.  Patient also talks about feeling of palpitations over the last several weeks sometimes at night.  Patient last seen in the [facility] emergency department on December 13 when she had a non-STEMI.  Past medical history sniffing for the coronary artery disease.  Patient is an everyday smoker half pack a day.       Home Medications Prior to Admission medications   Medication Sig Start Date End Date Taking? Authorizing Provider  metoprolol tartrate (LOPRESSOR) 25 MG tablet Take 0.5 tablets (12.5 mg total) by mouth 2 (two) times daily. 01/01/23  Yes Vanetta MuldersZackowski, Amatullah Christy, MD  potassium chloride SA (KLOR-CON M) 20 MEQ tablet Take 1 tablet (20 mEq total) by mouth 2 (two) times daily. 01/01/23  Yes Vanetta MuldersZackowski, Paz Winsett, MD  acetaminophen (TYLENOL) 325 MG tablet Take 325 mg by mouth every 6 (six) hours as  needed for mild pain, fever or headache.    [provider]  amLODipine (NORVASC) 5 MG tablet TAKE 1 TABLET (5 MG TOTAL) BY MOUTH DAILY. 12/04/22   Shade FloodGreene, Jeffrey R, MD  atorvastatin (LIPITOR) 80 MG tablet Take 1 tablet (80 mg total) by mouth daily. 09/12/22   Orpah CobbKadakia, Ajay, MD  busPIRone (BUSPAR) 5 MG tablet TAKE 1 TABLET BY MOUTH TWICE A DAY AS NEEDED Patient taking differently: Take 5 mg by mouth 2 (two) times daily. 08/31/22   Shade FloodGreene, Jeffrey R, MD  gabapentin (NEURONTIN) 300 MG capsule TAKE 1-2 CAPSULES (300-600 MG TOTAL) BY MOUTH AT BEDTIME. 10/19/22   Shade FloodGreene, Jeffrey R, MD  isosorbide mononitrate (IMDUR) 30 MG 24 hr tablet Take 1 tablet (30 mg total) by mouth daily. 09/12/22   Orpah CobbKadakia, Ajay, MD  metoprolol tartrate (LOPRESSOR) 25 MG tablet Take 0.5 tablets (12.5 mg total) by mouth 2 (two) times daily. 09/11/22   Orpah CobbKadakia, Ajay, MD  nitroGLYCERIN (NITROSTAT) 0.4 MG SL tablet Place 1 tablet (0.4 mg total) under the tongue every 5 (five) minutes as needed for chest pain. 09/11/22   Orpah CobbKadakia, Ajay, MD  sacubitril-valsartan (ENTRESTO) 24-26 MG Take 1 tablet by mouth 2 (two) times daily. 09/11/22   Orpah CobbKadakia, Ajay, MD  venlafaxine XR (EFFEXOR-XR) 75 MG 24 hr capsule TAKE 3 CAPSULES (225 MG TOTAL) BY MOUTH DAILY WITH BREAKFAST 12/18/22   Shade FloodGreene, Jeffrey R, MD      Allergies  Codeine    Review of Systems   Review of Systems  Constitutional:  Negative for chills and fever.  HENT:  Negative for ear pain and sore throat.   Eyes:  Negative for pain and visual disturbance.  Respiratory:  Negative for cough and shortness of breath.   Cardiovascular:  Positive for chest pain and palpitations.  Gastrointestinal:  Negative for abdominal pain and vomiting.  Genitourinary:  Negative for dysuria and hematuria.  Musculoskeletal:  Negative for arthralgias and back pain.  Skin:  Negative for color change and rash.  Neurological:  Negative for seizures and syncope.  All other systems reviewed and are  negative.   Physical Exam Updated Vital Signs BP 126/76 (BP Location: Left Arm)   Pulse 78   Temp 98.2 F (36.8 C) (Oral)   Resp 16   Ht 1.626 m (5\' 4" )   Wt 78.9 kg   SpO2 98%   BMI 29.87 kg/m  Physical Exam Vitals and nursing note reviewed.  Constitutional:      General: She is not in acute distress.    Appearance: Normal appearance. She is well-developed. She is not ill-appearing.  HENT:     Head: Normocephalic and atraumatic.     Mouth/Throat:     Mouth: Mucous membranes are moist.  Eyes:     Extraocular Movements: Extraocular movements intact.     Conjunctiva/sclera: Conjunctivae normal.     Pupils: Pupils are equal, round, and reactive to light.  Cardiovascular:     Rate and Rhythm: Normal rate and regular rhythm.     Heart sounds: No murmur heard. Pulmonary:     Effort: Pulmonary effort is normal. No respiratory distress.     Breath sounds: Normal breath sounds. No wheezing, rhonchi or rales.  Chest:     Chest wall: No tenderness.  Abdominal:     Palpations: Abdomen is soft.     Tenderness: There is no abdominal tenderness.  Musculoskeletal:        General: No swelling.     Cervical back: Normal range of motion and neck supple.     Right lower leg: No edema.     Left lower leg: No edema.  Skin:    General: Skin is warm and dry.     Capillary Refill: Capillary refill takes less than 2 seconds.  Neurological:     General: No focal deficit present.     Mental Status: She is alert and oriented to person, place, and time.     Cranial Nerves: No cranial nerve deficit.     Sensory: No sensory deficit.     Motor: No weakness.  Psychiatric:        Mood and Affect: Mood normal.     ED Results / Procedures / Treatments   Labs (all labs ordered are listed, but only abnormal results are displayed) Labs Reviewed  COMPREHENSIVE METABOLIC PANEL - Abnormal; Notable for the following components:      Result Value   Potassium 3.1 (*)    Alkaline Phosphatase 129  (*)    Anion gap 17 (*)    All other components within normal limits  CBC  LIPASE, BLOOD  TROPONIN I (HIGH SENSITIVITY)  TROPONIN I (HIGH SENSITIVITY)    EKG EKG Interpretation  Date/Time:  Friday January 01 2023 11:36:03 EDT Ventricular Rate:  88 PR Interval:  144 QRS Duration: 92 QT Interval:  460 QTC Calculation: 493 R Axis:   31 Text Interpretation: Sinus rhythm Paired ventricular premature complexes Abnormal  T, consider ischemia, lateral leads ST elevation, consider inferior injury Confirmed by Vanetta MuldersZackowski, Terrace Chiem [phone]([id number]) on 01/01/2023 1:51:34 PM  Radiology DG Chest 2 View  Result Date: 01/01/2023 CLINICAL DATA:  CP EXAM: CHEST - 2 VIEW COMPARISON:  09/09/2022. FINDINGS: The heart size and mediastinal contours are within normal limits. Both lungs are clear. No pneumothorax or pleural effusion. There are thoracic degenerative changes. IMPRESSION: No active cardiopulmonary disease. Electronically Signed   By: Layla MawJoshua  Pleasure M.D.   On: 01/01/2023 12:05    Procedures Procedures    Medications Ordered in ED Medications - No data to display  ED Course/ Medical Decision Making/ A&P                             Medical Decision Making Amount and/or Complexity of Data Reviewed Labs: ordered. Radiology: ordered.  Risk Prescription drug management.   Patient's workup here troponins first 1 was 3 repeat was 4 no significant delta troponin change.  Patient has follow-up with cardiology Wednesday of next week.  Patient out of her Lopressor she is post take 12.5 mg twice a day.  New prescription provided.  Clinical symptoms oxygen saturations are very good no concerns about pulmonary embolus.  Chest x-ray negative.  EKG without any acute findings.  Pleat metabolic panel normal other than a potassium of 3.1.  Will give some potassium supplementation.  Renal function normal alk phos up some at 129 total bili normal.  Lipase was normal.  Precautions to return.  Patient nontoxic no acute  distress.  Stable for discharge home close follow-up with cardiology already has an appointment for Wednesday of next week.   Final Clinical Impression(s) / ED Diagnoses Final diagnoses:  Precordial pain  Hypokalemia    Rx / DC Orders ED Discharge Orders          Ordered    metoprolol tartrate (LOPRESSOR) 25 MG tablet  2 times daily        01/01/23 1532    potassium chloride SA (KLOR-CON M) 20 MEQ tablet  2 times daily        01/01/23 1532              Vanetta MuldersZackowski, Redford Behrle, MD 01/01/23 1559

## 2023-01-01 NOTE — Discharge Instructions (Addendum)
Lopressor sent to the [facility] pharmacy in [place].  Take as directed.  Make sure you keep your appointment to follow-up with your cardiologist at [facility].  Return for any new or worse symptoms.  Today's workup without any acute findings.  The troponin heart markers were both normal.  Very reassuring.  Also potassium was slightly low today at 3.1.  Very important that that is normalized so take the potassium supplement as directed sent to your pharmacy as well.  Have your cardiologist recheck your potassium level.

## 2023-01-01 NOTE — ED Notes (Signed)
Attempted 2nd IV x3, unsuccessful despite [place]. Blood obtained and sent.

## 2023-01-01 NOTE — ED Triage Notes (Signed)
Pt arrives via [facility] from work c/o sudden onset CP at approximately 0900, pt reports pain 4/10 radiating into right shoulder. Denies N/V, SOB. Hx of STEMI 09/10/23.   324 Asprin and 1 SL Nitro given by EMS with no relief.

## 2023-01-01 NOTE — ED Notes (Signed)
Back from xray. Rates chest pressure 7/10. Placed on BP to void. Husband at [facility]. Returned to monitor.

## 2023-01-05 NOTE — Progress Notes (Signed)
Cardiology Office Note:    Date:  01/06/2023   ID:  Rachel KaufmannElizabeth P Mayer, DOB 07/20/1966, MRN [id number]  PCP:  Shade FloodGreene, Jeffrey R, MD  Cardiologist:  Jodelle RedBridgette Kaya Klausing, MD  Referring MD: Shade FloodGreene, Jeffrey R, MD   CC: new patient consultation for palpitations  History of Present Illness:    Rachel Roth is a 57 y.o. female with a hx of hypertension, ACS, and SCAD who is seen as a new consult at the request of Shade FloodGreene, Jeffrey R, MD for the evaluation and management of palpitations.  Tachycardia/palpitations: -Initial onset: 01/01/2023 -Associated symptoms: chest pain, fatigue -Prior cardiac history: hypertension, ACS, SCAD,  -Prior workup: Cath performed when hospitalized  -Prior treatment: Amlodipine and Metoprolol -Possible medication interactions: N/A -Cardiac ROS: no shortness of breath, no PND, no orthopnea, no LE edema.  Today, she is accompanied by her husband. She is overall feeling well during this visit. She presented to the hospital on 01/01/2023 with chest pain, palpitations, and fatigue. Her husband reports that she did not originally feel her blood pressure was elevated. Her husband encouraged her to go to the nearest fire station and she was later transported to the ED via EMS. She was told she had an MI and was diagnosed with SCAD. She was not compliant with her Amlodipine at the time due to the belief it was not working. She is compliant with 25 mg Metoprolol daily, half tablet in the morning and in the evening. She denies any concerning side effects to her medications.   She reports she occasionally feels fatigued. She feels as thought there are certain things she would like to do that she doesn't have the energy to do alone, such as cleaning her closet, moving things that are heavy  She reports episodes of feeling like her heart was fluttering or flip flopping and notes it has not happened since being discharged. She endorses a sore spot in the center of her chest  that bothers her often.  She enjoys the outdoors and summer time. Since her hospitalization she has mowed the lawn. Her husband reports afterwards her HR was 109 bpm and later in the evening was 100 bpm. Since starting her Metoprolol her systolic BP is in the 120s and HR in the 60s. She has continued to monitor her blood pressure and notes it is sometimes in the 130s systolic.   She has quit smoking since being discharged.   She denies any shortness of breath, or peripheral edema. No lightheadedness, headaches, syncope, orthopnea, or PND.  Past Medical History:  Diagnosis Date   Allergy    Arthritis     Past Surgical History:  Procedure Laterality Date   ABDOMINAL HYSTERECTOMY     CESAREAN SECTION     LEFT HEART CATH AND CORONARY ANGIOGRAPHY N/A 09/09/2022   Procedure: LEFT HEART CATH AND CORONARY ANGIOGRAPHY;  Surgeon: Yates DecampGanji, Jay, MD;  Location: [facility];  Service: Cardiovascular;  Laterality: N/A;   TOTAL ABDOMINAL HYSTERECTOMY      Current Medications: Current Outpatient Medications on File Prior to Visit  Medication Sig   acetaminophen (TYLENOL) 325 MG tablet Take 325 mg by mouth every 6 (six) hours as needed for mild pain, fever or headache.   amLODipine (NORVASC) 5 MG tablet TAKE 1 TABLET (5 MG TOTAL) BY MOUTH DAILY.   atorvastatin (LIPITOR) 80 MG tablet Take 1 tablet (80 mg total) by mouth daily.   busPIRone (BUSPAR) 5 MG tablet TAKE 1 TABLET BY MOUTH TWICE A DAY AS  NEEDED (Patient taking differently: Take 5 mg by mouth 2 (two) times daily.)   gabapentin (NEURONTIN) 300 MG capsule TAKE 1-2 CAPSULES (300-600 MG TOTAL) BY MOUTH AT BEDTIME.   isosorbide mononitrate (IMDUR) 30 MG 24 hr tablet Take 1 tablet (30 mg total) by mouth daily.   metoprolol tartrate (LOPRESSOR) 25 MG tablet Take 0.5 tablets (12.5 mg total) by mouth 2 (two) times daily.   nitroGLYCERIN (NITROSTAT) 0.4 MG SL tablet Place 1 tablet (0.4 mg total) under the tongue every 5 (five) minutes as needed for  chest pain.   sacubitril-valsartan (ENTRESTO) 24-26 MG Take 1 tablet by mouth 2 (two) times daily.   venlafaxine XR (EFFEXOR-XR) 75 MG 24 hr capsule TAKE 3 CAPSULES (225 MG TOTAL) BY MOUTH DAILY WITH BREAKFAST   No current facility-administered medications on file prior to visit.     Allergies:   Codeine   Social History   Tobacco Use   Smoking status: Every Day    Packs/day: .5    Types: Cigarettes   Smokeless tobacco: Never  Substance Use Topics   Alcohol use: No   Drug use: No    Family History: family history includes Cancer in her maternal grandmother; Cancer (age of onset: 41) in her maternal aunt; Cancer (age of onset: 100) in her sister; Cancer (age of onset: 86) in her father; Diabetes in her brother, mother, sister, and sister.  ROS:   Please see the history of present illness.  Additional pertinent ROS: Constitutional: Negative for chills, fever, night sweats, unintentional weight loss  HENT: Negative for ear pain and hearing loss.   Eyes: Negative for loss of vision and eye pain.  Respiratory: Negative for cough, sputum, wheezing.   Cardiovascular: See HPI. Gastrointestinal: Negative for abdominal pain, melena, and hematochezia.  Genitourinary: Negative for dysuria and hematuria.  Musculoskeletal: Negative for falls and myalgias.  Skin: Negative for itching and rash.  Neurological: Negative for focal weakness, focal sensory changes and loss of consciousness.  Endo/Heme/Allergies: Does not bruise/bleed easily.     EKGs/Labs/Other Studies Reviewed:    The following studies were reviewed today:  Echo 09/10/2022: IMPRESSIONS   1. Left ventricular ejection fraction, by estimation, is 50 to 55%. The  left ventricle has low normal function. The left ventricle demonstrates  regional wall motion abnormalities (see scoring diagram/findings for  description). Left ventricular diastolic parameters are consistent with Grade I diastolic dysfunction (impaired relaxation).  There is akinesis of the left ventricular, entire apical segment. There is moderate hypokinesis of the left ventricular, mid-apical anteroseptal wall and anterior wall.   2. Right ventricular systolic function is normal. The right ventricular size is normal.   3. Left atrial size was mildly dilated.   4. The mitral valve is normal in structure. Mild mitral valve regurgitation.   5. The aortic valve is tricuspid. Aortic valve regurgitation is not visualized.   Left Cardiac Cath 09/09/22: RCA: Large-caliber vessel, smooth and normal, dominant. LM: Large-caliber vessel.  Smooth and normal. LCx: Gives origin to very small OM1 and OM 2 and continues as a large OM 3.  AV groove circumflex is small.  Normal.  OM 3 is tortuous. LAD: Very large caliber vessel, smooth and normal, gives origin to small D1 and moderate-sized D 2 and 3.  After the origin of D3, there is spontaneous dissection of the LAD all the way to the apex.  TIMI-3 flow. LV: Normal LVEF, 50 to 55%, no wall motion abnormality. Hemodynamics: LV 148/27, EDP 14 mmHg.  Ao  160/76, mean 160 mmHg.  No pressure gradient across aortic valve.  EKG:  EKG is personally reviewed.   01/06/2023: not ordered today  Recent Labs: 09/09/2022: B Natriuretic Peptide 23.1 12/23/2022: TSH 2.08 01/01/2023: ALT 21; BUN 7; Creatinine, Ser 0.75; Hemoglobin 13.4; Platelets 292; Potassium 3.1; Sodium 142  Recent Lipid Panel    Component Value Date/Time   CHOL 141 12/23/2022 0935   CHOL 196 01/13/2019 1018   TRIG 132.0 12/23/2022 0935   HDL 47.80 12/23/2022 0935   HDL 52 01/13/2019 1018   CHOLHDL 3 12/23/2022 0935   VLDL 26.4 12/23/2022 0935   LDLCALC 67 12/23/2022 0935   LDLCALC 115 (H) 01/13/2019 1018    Physical Exam:    VS:  BP 112/70   Pulse 60   Ht 5\' 4"  (1.626 m)   Wt 179 lb (81.2 kg)   BMI 30.73 kg/m     Wt Readings from Last 3 Encounters:  01/06/23 179 lb (81.2 kg)  01/01/23 174 lb (78.9 kg)  12/23/22 175 lb 12.8 oz (79.7 kg)    GEN:  Well nourished, well developed in no acute distress HEENT: Normal, moist mucous membranes NECK: No JVD CARDIAC: regular rhythm, normal S1 and S2, no rubs or gallops. No murmur. VASCULAR: Radial and DP pulses 2+ bilaterally. No carotid bruits RESPIRATORY:  Clear to auscultation without rales, wheezing or rhonchi  ABDOMEN: Soft, non-tender, non-distended MUSCULOSKELETAL:  Ambulates independently SKIN: Warm and dry, no edema NEUROLOGIC:  Alert and oriented x 3. No focal neuro deficits noted. PSYCHIATRIC:  Normal affect    ASSESSMENT:    1. Spontaneous dissection of coronary artery   2. Heart palpitations   3. Encounter to discuss test results   4. Cardiac risk counseling   5. Essential hypertension   6. Exercise counseling    PLAN:    Palpitations -reviewed ER visit and testing results -has not had any events since ER visit. Discussed Kardia and event monitor. If symptoms recur, would get monitor  Hypertension -at goal today -continue amlodipine, isosorbine, metoprolol, entresto  History of SCAD -given specific instructions on activity recommendations in AVS -reviewed her prior echo/cath -repeat echo (has been 3 mos) given WMA noted on initial study  Cardiac risk counseling and prevention recommendations: -recommend heart healthy/Mediterranean diet, with whole grains, fruits, vegetable, fish, lean meats, nuts, and olive oil. Limit salt. -recommend avoidance of tobacco products. Avoid excess alcohol.  Plan for follow up: 3 months, or sooner if needed.   Jodelle Red, MD, PhD, [facility] [place]  [facility]    Heart & Vascular at [facility] at [facility] [address], [address] [place], [place] [address] [phone]   Medication Adjustments/Labs and Tests Ordered: Current medicines are reviewed at length with the patient today.  Concerns regarding medicines are outlined above.  Orders Placed This Encounter  Procedures    ECHOCARDIOGRAM COMPLETE   No orders of the defined types were placed in this encounter.   Patient Instructions  Medication Instructions:  N/A *If you need a refill on your cardiac medications before your next appointment, please call your pharmacy*   Lab Work: N/A    Testing/Procedures: Your physician has requested that you have an echocardiogram.   Echocardiography is a painless test that uses sound waves to create images of your heart. It provides your doctor with information about the size and shape of your heart and how well your heart's chambers and valves are working. This procedure takes approximately one hour. There are  no restrictions for this procedure. Please do NOT wear cologne, perfume, aftershave, or lotions (deodorant is allowed). Please arrive 15 minutes prior to your appointment time.    Follow-Up: At [facility], you and your health needs are our priority.  As part of our continuing mission to provide you with exceptional heart care, we have created designated Provider Care Teams.  These Care Teams include your primary Cardiologist (physician) and Advanced Practice Providers (APPs -  Physician Assistants and Nurse Practitioners) who all work together to provide you with the care you need, when you need it.  We recommend signing up for the patient portal called "MyChart".  Sign up information is provided on this After Visit Summary.  MyChart is used to connect with patients for Virtual Visits (Telemedicine).  Patients are able to view lab/test results, encounter notes, upcoming appointments, etc.  Non-urgent messages can be sent to your provider as well.   To learn more about what you can do with MyChart, go to [email or web address].    Your next appointment:   3 month(s)  Provider:   Jodelle Red, MD    Other  Instructions [email or web address]  [email or web address]/07/08/17/27/Physical-Activity-and-Exercise-in-Patients#:~:text=Activity%20such%20as%20endurance%20aerobic,exercises%20in%20extreme%20environmental%20conditions  Patients with SCAD and FMD remain at risk of recurrent vascular dissection in the setting of increased mechanical and shear stress of the arteries. Extreme or unusual exercise has been reported as a trigger for SCAD in up to 29% of patients, although causation has not been proven. Exercise in general is associated with positive effects on vascular function, and as such, limiting exercise among patients with SCAD or FMD due to mechanistic risk should be avoided. With limited available data, moderate aerobic training among SCAD patients is recommended for 30-40 minutes 5-7 days/week. Cardiac rehabilitation should be recommended for all patients. In addition, patients should be counseled to remain active using interval training and low resistance weight training. Activity such as endurance aerobic training, muscle building, and yoga with extreme head and neck positions should be done with caution. Patients should use proper breathing and lifting techniques, avoid Valsalva or straining, and avoid high-intensity exercises in extreme environmental conditions. Patients with carotid or vertebral artery dissections should avoid exercises such as push-ups and sit-ups for 8-12 weeks after the acute dissection after which the recommendations would be similar as for SCAD. There are no data currently to support restricting activities such as riding a roller coaster, sky diving, scuba diving, or sexual intercourse.    I,Rachel Rivera,acting as a Neurosurgeon for [employer], MD.,have documented all relevant documentation on the behalf of Jodelle Red, MD,as directed by  Jodelle Red, MD while in the presence of Jodelle Red, MD.  I, Jodelle Red, MD, have reviewed all documentation for this visit. The documentation on 01/21/23 for the exam, diagnosis, procedures, and orders are all accurate and complete.    Signed, Jodelle Red, MD PhD 01/06/2023     [facility]

## 2023-01-06 ENCOUNTER — Encounter (HOSPITAL_BASED_OUTPATIENT_CLINIC_OR_DEPARTMENT_OTHER): Payer: Self-pay | Admitting: Cardiology

## 2023-01-06 ENCOUNTER — Ambulatory Visit (HOSPITAL_BASED_OUTPATIENT_CLINIC_OR_DEPARTMENT_OTHER): Payer: 59 | Admitting: Cardiology

## 2023-01-06 VITALS — BP 112/70 | HR 60 | Ht 64.0 in | Wt 179.0 lb

## 2023-01-06 DIAGNOSIS — Z712 Person consulting for explanation of examination or test findings: Secondary | ICD-10-CM | POA: Diagnosis not present

## 2023-01-06 DIAGNOSIS — I2542 Coronary artery dissection: Secondary | ICD-10-CM | POA: Diagnosis not present

## 2023-01-06 DIAGNOSIS — R002 Palpitations: Secondary | ICD-10-CM | POA: Diagnosis not present

## 2023-01-06 DIAGNOSIS — Z7189 Other specified counseling: Secondary | ICD-10-CM | POA: Diagnosis not present

## 2023-01-06 DIAGNOSIS — I10 Essential (primary) hypertension: Secondary | ICD-10-CM

## 2023-01-06 DIAGNOSIS — Z7182 Exercise counseling: Secondary | ICD-10-CM

## 2023-01-06 NOTE — Patient Instructions (Signed)
Medication Instructions:  N/A *If you need a refill on your cardiac medications before your next appointment, please call your pharmacy*   Lab Work: N/A    Testing/Procedures: Your physician has requested that you have an echocardiogram.   Echocardiography is a painless test that uses sound waves to create images of your heart. It provides your doctor with information about the size and shape of your heart and how well your heart's chambers and valves are working. This procedure takes approximately one hour. There are no restrictions for this procedure. Please do NOT wear cologne, perfume, aftershave, or lotions (deodorant is allowed). Please arrive 15 minutes prior to your appointment time.    Follow-Up: At [facility], you and your health needs are our priority.  As part of our continuing mission to provide you with exceptional heart care, we have created designated Provider Care Teams.  These Care Teams include your primary Cardiologist (physician) and Advanced Practice Providers (APPs -  Physician Assistants and Nurse Practitioners) who all work together to provide you with the care you need, when you need it.  We recommend signing up for the patient portal called "MyChart".  Sign up information is provided on this After Visit Summary.  MyChart is used to connect with patients for Virtual Visits (Telemedicine).  Patients are able to view lab/test results, encounter notes, upcoming appointments, etc.  Non-urgent messages can be sent to your provider as well.   To learn more about what you can do with MyChart, go to [email or web address].    Your next appointment:   3 month(s)  Provider:   Jodelle Red, MD    Other  Instructions [email or web address]/ten-points-[email or web address]-[email or web address]/07/08/17/27/Physical-Activity-and-Exercise-in-Patients#:~:text=Activity%20such%20as%20endurance%20aerobic,exercises%20in%20extreme%20environmental%20conditions  Patients with SCAD and FMD remain at risk of recurrent vascular dissection in the setting of increased mechanical and shear stress of the arteries. Extreme or unusual exercise has been reported as a trigger for SCAD in up to 29% of patients, although causation has not been proven. Exercise in general is associated with positive effects on vascular function, and as such, limiting exercise among patients with SCAD or FMD due to mechanistic risk should be avoided. With limited available data, moderate aerobic training among SCAD patients is recommended for 30-40 minutes 5-7 days/week. Cardiac rehabilitation should be recommended for all patients. In addition, patients should be counseled to remain active using interval training and low resistance weight training. Activity such as endurance aerobic training, muscle building, and yoga with extreme head and neck positions should be done with caution. Patients should use proper breathing and lifting techniques, avoid Valsalva or straining, and avoid high-intensity exercises in extreme environmental conditions. Patients with carotid or vertebral artery dissections should avoid exercises such as push-ups and sit-ups for 8-12 weeks after the acute dissection after which the recommendations would be similar as for SCAD. There are no data currently to support restricting activities such as riding a roller coaster, sky diving, scuba diving, or sexual intercourse.

## 2023-01-16 ENCOUNTER — Other Ambulatory Visit: Payer: Self-pay | Admitting: Family Medicine

## 2023-01-16 DIAGNOSIS — M1990 Unspecified osteoarthritis, unspecified site: Secondary | ICD-10-CM

## 2023-01-16 DIAGNOSIS — M542 Cervicalgia: Secondary | ICD-10-CM

## 2023-01-19 ENCOUNTER — Other Ambulatory Visit (HOSPITAL_BASED_OUTPATIENT_CLINIC_OR_DEPARTMENT_OTHER): Payer: Self-pay

## 2023-01-19 DIAGNOSIS — I10 Essential (primary) hypertension: Secondary | ICD-10-CM

## 2023-01-19 MED ORDER — AMLODIPINE BESYLATE 5 MG PO TABS
5.0000 mg | ORAL_TABLET | Freq: Every day | ORAL | 3 refills | Status: DC
Start: 2023-01-19 — End: 2024-02-08

## 2023-01-19 MED ORDER — ISOSORBIDE MONONITRATE ER 30 MG PO TB24: 30.0000 mg | ORAL_TABLET | Freq: Every day | ORAL | 3 refills | Status: DC

## 2023-01-19 MED ORDER — METOPROLOL TARTRATE 25 MG PO TABS: 12.5000 mg | ORAL_TABLET | Freq: Two times a day (BID) | ORAL | 3 refills | Status: DC

## 2023-01-19 MED ORDER — SACUBITRIL-VALSARTAN 24-26 MG PO TABS: 1.0000 | ORAL_TABLET | Freq: Two times a day (BID) | ORAL | 3 refills | Status: DC

## 2023-01-26 ENCOUNTER — Other Ambulatory Visit: Payer: Self-pay | Admitting: Family Medicine

## 2023-01-26 DIAGNOSIS — F32A Depression, unspecified: Secondary | ICD-10-CM

## 2023-01-26 DIAGNOSIS — F3341 Major depressive disorder, recurrent, in partial remission: Secondary | ICD-10-CM

## 2023-02-04 ENCOUNTER — Ambulatory Visit (INDEPENDENT_AMBULATORY_CARE_PROVIDER_SITE_OTHER): Payer: 59

## 2023-02-04 DIAGNOSIS — I2542 Coronary artery dissection: Secondary | ICD-10-CM | POA: Diagnosis not present

## 2023-02-04 LAB — ECHOCARDIOGRAM COMPLETE
Area-P 1/2: 3.17 cm2
S' Lateral: 2.91 cm

## 2023-02-04 MED ORDER — PERFLUTREN LIPID MICROSPHERE
1.0000 mL | INTRAVENOUS | Status: AC | PRN
Start: 2023-02-04 — End: 2023-02-04
  Administered 2023-02-04: 1 mL via INTRAVENOUS

## 2023-03-21 ENCOUNTER — Other Ambulatory Visit: Payer: Self-pay | Admitting: Family Medicine

## 2023-03-21 DIAGNOSIS — G2581 Restless legs syndrome: Secondary | ICD-10-CM

## 2023-03-22 NOTE — Telephone Encounter (Signed)
Gabapentin 300 mg LOV: 12/23/22 Last Refill:10/19/22 Upcoming appt: none

## 2023-03-22 NOTE — Telephone Encounter (Signed)
Medication discussed at March 27 visit.  Controlled substance database reviewed.  Last filled #60 on 01/16/2023.  Refill ordered.

## 2023-04-23 NOTE — Progress Notes (Signed)
Cardiology Office Note:  .    Date:  04/26/2023  ID:  [name], DOB [birth year], MRN [id number] PCP: Shade Flood, MD  [facility] Providers Cardiologist:  Jodelle Red, MD     History of Present Illness: .    Rachel Roth is a 57 y.o. female with a hx of hypertension, ACS, and SCAD who is seen for follow-up today. She was initially seen 01/06/2023 as a new consult at the request of Shade Flood, MD for the evaluation and management of palpitations.   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   Today, she is accompanied by her grandson. She continues to have occasional soreness in the same location of her central chest that she noted before. Her discomfort seems to be associated with too much activity or stress. May sit down when it occurs and she waits for her symptoms to resolve on their own.  Sometimes she notices racing heart beats/palpitations; these do not limit her. No passing out or blacking out.  Lately she has been staying active but notes that she becomes fatigued more easily. She wonders if this is related to her medications.  If she is sitting for too long, her legs begin to feel tight. She denies significant issues with edema if she stays active.  We reviewed the results of her echocardiogram 01/2023 at length. In April 2024 she had been in the ER due to hypokalemia.  She denies any shortness of breath, lightheadedness, headaches, syncope, orthopnea, or PND.  ROS:  Please see the history of present illness. ROS otherwise negative except as noted.  (+) Central chest soreness (+) Occasional palpitations (+) Exertional fatigue  Studies Reviewed: .         Echocardiogram 02/04/2023: Sonographer Comments: Post definity 125/69  IMPRESSIONS   1. 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 There is significant  swirling of contrast in the apex. Ijmages are forshortened in the apical  views and cannot rule out early forming thrombus. Could consider cardiac  MRI for better assessment for apical  thrombus. . Left ventricular ejection fraction, by estimation, is 55 to  60%. The left ventricle has normal function. The left ventricle has no  regional wall motion abnormalities. Left ventricular diastolic parameters  were normal. There is akinesis of the   left ventricular, apical segment. The average left ventricular global  longitudinal strain is -15.4 %. The global longitudinal strain is  abnormal.   2. Right ventricular systolic function is normal. The right ventricular  size is mildly enlarged.   3. The mitral valve is normal in structure. No evidence of mitral valve  regurgitation. No evidence of mitral stenosis.   4. The aortic valve is normal in structure. Aortic valve regurgitation is  not visualized. No aortic stenosis is present.   5. The inferior vena cava is normal in size with greater than 50%  respiratory variability, suggesting right atrial pressure of 3 mmHg.    Physical Exam:    VS:  BP 127/81 (BP Location: Left Arm, Patient Position: Sitting, Cuff Size: Large)   Pulse 63   Ht 5\' 4"  (1.626 m)   Wt 172 lb 9.6 oz (78.3 kg)   SpO2 97%   BMI 29.63 kg/m    Wt Readings from Last 3 Encounters:  04/26/23 172 lb 9.6 oz (78.3 kg)  01/06/23 179 lb (81.2  kg)  01/01/23 174 lb (78.9 kg)    GEN: Well nourished, well developed in no acute distress HEENT: Normal, moist mucous membranes NECK: No JVD CARDIAC: regular rhythm, normal S1 and S2, no rubs or gallops. No murmur. VASCULAR: Radial and DP pulses 2+ bilaterally. No carotid bruits RESPIRATORY:  Clear to auscultation without rales, wheezing or rhonchi  ABDOMEN: Soft, non-tender, non-distended MUSCULOSKELETAL:  Ambulates independently SKIN: Warm and dry, no edema NEUROLOGIC:  Alert and oriented x 3. No  focal neuro deficits noted. PSYCHIATRIC:  Normal affect   ASSESSMENT AND PLAN: .    Palpitations -intermittent, mild -Discussed Kardia and event monitor. If symptoms worsen would get monitor   Hypertension -at goal today -continue amlodipine, isosorbine, metoprolol, entresto   History of SCAD -no further chest pain -reviewed recent echo, which shows small focal WMA from infarct from SCAD, no thrombus, otherwise normal   Cardiac risk counseling and prevention recommendations: -recommend heart healthy/Mediterranean diet, with whole grains, fruits, vegetable, fish, lean meats, nuts, and olive oil. Limit salt. -recommend avoidance of tobacco products. Avoid excess alcohol.  Dispo: Follow-up in 6 months, or sooner as needed.  I,Mathew Stumpf,acting as a Neurosurgeon for [employer], MD.,have documented all relevant documentation on the behalf of Jodelle Red, MD,as directed by  Jodelle Red, MD while in the presence of Jodelle Red, MD.  I, Jodelle Red, MD, have reviewed all documentation for this visit. The documentation on 04/26/23 for the exam, diagnosis, procedures, and orders are all accurate and complete.   Signed, Jodelle Red, MD

## 2023-04-26 ENCOUNTER — Ambulatory Visit (HOSPITAL_BASED_OUTPATIENT_CLINIC_OR_DEPARTMENT_OTHER): Payer: 59 | Admitting: Cardiology

## 2023-04-26 ENCOUNTER — Encounter (HOSPITAL_BASED_OUTPATIENT_CLINIC_OR_DEPARTMENT_OTHER): Payer: Self-pay | Admitting: Cardiology

## 2023-04-26 VITALS — BP 127/81 | HR 63 | Ht 64.0 in | Wt 172.6 lb

## 2023-04-26 DIAGNOSIS — Z7189 Other specified counseling: Secondary | ICD-10-CM | POA: Diagnosis not present

## 2023-04-26 DIAGNOSIS — R002 Palpitations: Secondary | ICD-10-CM | POA: Diagnosis not present

## 2023-04-26 DIAGNOSIS — Z712 Person consulting for explanation of examination or test findings: Secondary | ICD-10-CM | POA: Diagnosis not present

## 2023-04-26 DIAGNOSIS — I2542 Coronary artery dissection: Secondary | ICD-10-CM

## 2023-04-26 NOTE — Patient Instructions (Signed)

## 2023-05-23 ENCOUNTER — Other Ambulatory Visit: Payer: Self-pay | Admitting: Family Medicine

## 2023-05-23 DIAGNOSIS — F419 Anxiety disorder, unspecified: Secondary | ICD-10-CM

## 2023-05-23 DIAGNOSIS — F3341 Major depressive disorder, recurrent, in partial remission: Secondary | ICD-10-CM

## 2023-08-12 LAB — HM MAMMOGRAPHY

## 2023-08-13 ENCOUNTER — Encounter: Payer: Self-pay | Admitting: Family Medicine

## 2023-10-15 ENCOUNTER — Ambulatory Visit (HOSPITAL_BASED_OUTPATIENT_CLINIC_OR_DEPARTMENT_OTHER): Payer: 59 | Admitting: Cardiology

## 2023-10-15 ENCOUNTER — Encounter (HOSPITAL_BASED_OUTPATIENT_CLINIC_OR_DEPARTMENT_OTHER): Payer: Self-pay | Admitting: Cardiology

## 2023-10-15 VITALS — BP 118/75 | HR 73 | Ht 63.0 in | Wt 166.7 lb

## 2023-10-15 DIAGNOSIS — I2542 Coronary artery dissection: Secondary | ICD-10-CM

## 2023-10-15 DIAGNOSIS — Z7189 Other specified counseling: Secondary | ICD-10-CM | POA: Diagnosis not present

## 2023-10-15 DIAGNOSIS — R002 Palpitations: Secondary | ICD-10-CM

## 2023-10-15 DIAGNOSIS — I10 Essential (primary) hypertension: Secondary | ICD-10-CM

## 2023-10-15 NOTE — Patient Instructions (Signed)

## 2023-10-15 NOTE — Progress Notes (Signed)
  Cardiology Office Note:  .    Date:  10/15/2023  ID:  Rachel Roth, DOB December 03, 1965, MRN [id number] PCP: Shade Flood, MD  [facility] Providers Cardiologist:  Jodelle Red, MD     History of Present Illness: .    Rachel Roth is a 58 y.o. female with a hx of hypertension, ACS, and SCAD who is seen for follow-up today. She was initially seen 01/06/2023 as a new consult at the request of Shade Flood, MD for the evaluation and management of palpitations.   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   Today: Overall doing well. Continues to chronically feel some mild chest soreness/tenderness when she pushes herself hard, but no severe events. Breathing is ok. She is smoking, discussed quitting. Drinks a lot of Dr. Reino Kent. Trying to drink more water. Has general fatigue. No limitations of activity, but feels like she has to rest more after.  ROS:  Denies shortness of breath at rest or with normal exertion. No PND, orthopnea, LE edema or unexpected weight gain. No syncope or palpitations. ROS otherwise negative except as noted.   Studies Reviewed: Marland Kitchen         Physical Exam:    VS:  BP 118/75 (BP Location: Left Arm, Patient Position: Sitting, Cuff Size: Normal)   Pulse 73   Ht 5\' 3"  (1.6 m)   Wt 166 lb 11.2 oz (75.6 kg)   SpO2 96%   BMI 29.53 kg/m    Wt Readings from Last 3 Encounters:  10/15/23 166 lb 11.2 oz (75.6 kg)  04/26/23 172 lb 9.6 oz (78.3 kg)  01/06/23 179 lb (81.2 kg)    GEN: Well nourished, well developed in no acute distress HEENT: Normal, moist mucous membranes NECK: No JVD CARDIAC: regular rhythm, normal S1 and S2, no rubs or gallops. No murmur. VASCULAR: Radial and DP pulses 2+ bilaterally. No carotid bruits RESPIRATORY:  Clear to auscultation without rales, wheezing or rhonchi   ABDOMEN: Soft, non-tender, non-distended MUSCULOSKELETAL:  Ambulates independently SKIN: Warm and dry, no edema NEUROLOGIC:  Alert and oriented x 3. No focal neuro deficits noted. PSYCHIATRIC:  Normal affect   ASSESSMENT AND PLAN: .    Palpitations -minimal, on metoprolol -we have discussed Kardia and event monitor   Hypertension -at goal today -continue amlodipine, isosorbide, metoprolol, entresto   History of SCAD -no angina. Has some chronic chest soreness/tenderness -Prior echo shows small focal WMA from infarct from SCAD, no thrombus, otherwise normal  Tobacco use: working on quitting   Cardiac risk counseling and prevention recommendations: -recommend heart healthy/Mediterranean diet, with whole grains, fruits, vegetable, fish, lean meats, nuts, and olive oil. Limit salt. -recommend avoidance of tobacco products. Avoid excess alcohol.  Dispo: Follow-up in 12 months, or sooner as needed.  Signed, Jodelle Red, MD  Jodelle Red, MD, PhD, [facility]  [facility]  [place]  [facility] at [facility] at [facility] [address] [place], [place] [address] [phone]

## 2023-12-12 ENCOUNTER — Other Ambulatory Visit (HOSPITAL_BASED_OUTPATIENT_CLINIC_OR_DEPARTMENT_OTHER): Payer: Self-pay | Admitting: Cardiology

## 2023-12-22 ENCOUNTER — Ambulatory Visit (HOSPITAL_BASED_OUTPATIENT_CLINIC_OR_DEPARTMENT_OTHER): Payer: 59 | Admitting: Cardiology

## 2024-01-09 ENCOUNTER — Other Ambulatory Visit (HOSPITAL_BASED_OUTPATIENT_CLINIC_OR_DEPARTMENT_OTHER): Payer: Self-pay | Admitting: Cardiology

## 2024-01-09 ENCOUNTER — Other Ambulatory Visit: Payer: Self-pay | Admitting: Family Medicine

## 2024-01-09 DIAGNOSIS — F3341 Major depressive disorder, recurrent, in partial remission: Secondary | ICD-10-CM

## 2024-01-09 DIAGNOSIS — G2581 Restless legs syndrome: Secondary | ICD-10-CM

## 2024-01-09 DIAGNOSIS — F419 Anxiety disorder, unspecified: Secondary | ICD-10-CM

## 2024-02-07 ENCOUNTER — Other Ambulatory Visit: Payer: Self-pay

## 2024-02-07 ENCOUNTER — Other Ambulatory Visit: Payer: Self-pay | Admitting: Family Medicine

## 2024-02-07 DIAGNOSIS — F3341 Major depressive disorder, recurrent, in partial remission: Secondary | ICD-10-CM

## 2024-02-07 DIAGNOSIS — F32A Depression, unspecified: Secondary | ICD-10-CM

## 2024-02-07 DIAGNOSIS — G2581 Restless legs syndrome: Secondary | ICD-10-CM

## 2024-02-07 MED ORDER — GABAPENTIN 300 MG PO CAPS
300.0000 mg | ORAL_CAPSULE | Freq: Every day | ORAL | 2 refills | Status: DC
Start: 2024-02-07 — End: 2024-05-08

## 2024-02-07 NOTE — Telephone Encounter (Signed)
 30-month follow-up planned last year.  Please schedule.  I will temporarily refill meds.

## 2024-02-07 NOTE — Telephone Encounter (Signed)
 Patient has been scheduled for 02/14/2024

## 2024-02-08 ENCOUNTER — Other Ambulatory Visit (HOSPITAL_BASED_OUTPATIENT_CLINIC_OR_DEPARTMENT_OTHER): Payer: Self-pay | Admitting: Cardiology

## 2024-02-08 DIAGNOSIS — I10 Essential (primary) hypertension: Secondary | ICD-10-CM

## 2024-02-14 ENCOUNTER — Encounter: Payer: Self-pay | Admitting: Family Medicine

## 2024-02-14 ENCOUNTER — Ambulatory Visit: Admitting: Family Medicine

## 2024-02-14 VITALS — BP 116/70 | HR 65 | Temp 98.2°F | Resp 16 | Ht 63.0 in | Wt 163.8 lb

## 2024-02-14 DIAGNOSIS — E782 Mixed hyperlipidemia: Secondary | ICD-10-CM

## 2024-02-14 DIAGNOSIS — R5383 Other fatigue: Secondary | ICD-10-CM

## 2024-02-14 DIAGNOSIS — F32A Depression, unspecified: Secondary | ICD-10-CM

## 2024-02-14 DIAGNOSIS — F419 Anxiety disorder, unspecified: Secondary | ICD-10-CM | POA: Diagnosis not present

## 2024-02-14 DIAGNOSIS — I2542 Coronary artery dissection: Secondary | ICD-10-CM | POA: Diagnosis not present

## 2024-02-14 DIAGNOSIS — Z720 Tobacco use: Secondary | ICD-10-CM

## 2024-02-14 DIAGNOSIS — F3341 Major depressive disorder, recurrent, in partial remission: Secondary | ICD-10-CM | POA: Diagnosis not present

## 2024-02-14 DIAGNOSIS — G2581 Restless legs syndrome: Secondary | ICD-10-CM | POA: Diagnosis not present

## 2024-02-14 LAB — COMPREHENSIVE METABOLIC PANEL WITH GFR
ALT: 9 U/L (ref 0–35)
AST: 13 U/L (ref 0–37)
Albumin: 3.9 g/dL (ref 3.5–5.2)
Alkaline Phosphatase: 101 U/L (ref 39–117)
BUN: 9 mg/dL (ref 6–23)
CO2: 30 meq/L (ref 19–32)
Calcium: 9.1 mg/dL (ref 8.4–10.5)
Chloride: 104 meq/L (ref 96–112)
Creatinine, Ser: 0.57 mg/dL (ref 0.40–1.20)
GFR: 101.02 mL/min (ref >60.00)
Glucose, Bld: 84 mg/dL (ref 70–99)
Potassium: 3.6 meq/L (ref 3.5–5.1)
Sodium: 141 meq/L (ref 135–145)
Total Bilirubin: 0.3 mg/dL (ref 0.2–1.2)
Total Protein: 6.5 g/dL (ref 6.0–8.3)

## 2024-02-14 LAB — CBC
HCT: 40.8 % (ref 36.0–46.0)
Hemoglobin: 13.7 g/dL (ref 12.0–15.0)
MCHC: 33.7 g/dL (ref 30.0–36.0)
MCV: 84.2 fl (ref 78.0–100.0)
Platelets: 363 10*3/uL (ref 150.0–400.0)
RBC: 4.85 Mil/uL (ref 3.87–5.11)
RDW: 14.2 % (ref 11.5–15.5)
WBC: 9.1 10*3/uL (ref 4.0–10.5)

## 2024-02-14 LAB — LIPID PANEL
Cholesterol: 190 mg/dL (ref 0–200)
HDL: 37.3 mg/dL — ABNORMAL LOW (ref >39.00)
LDL Cholesterol: 101 mg/dL — ABNORMAL HIGH (ref 0–99)
NonHDL: 153.15
Total CHOL/HDL Ratio: 5
Triglycerides: 261 mg/dL — ABNORMAL HIGH (ref 0.0–149.0)
VLDL: 52.2 mg/dL — ABNORMAL HIGH (ref 0.0–40.0)

## 2024-02-14 MED ORDER — VENLAFAXINE HCL ER 75 MG PO CP24
225.0000 mg | ORAL_CAPSULE | Freq: Every day | ORAL | 3 refills | Status: AC
Start: 2024-02-14 — End: ?

## 2024-02-14 NOTE — Progress Notes (Signed)
 Subjective:  Patient ID: Rachel Roth, female    DOB: 1965-10-17  Age: 58 y.o. 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  Plan for follow-up with different cardiologist in April of last year. She is now followed by Dr. Veryl Gottron with last visit January 17. Echo in May 2024 with EF 55 to 60%, normal diastolic parameters, akinesis of the left ventricular apical segment.  Some general fatigue was discussed, but no limitations of activity.  Cardiac event monitor discussed and continued on amlodipine , isosorbide , metoprolol , Entresto .  Some chronic chest soreness/tenderness.  Dietary advice was given, smoking cessation recommended, and 58-month follow-up planned.   Has been off statin for a year. On it after hospital, then ran out. Has not been taking recently - labs today.   1 ppd smoker. Prior attempt - cold [place]. No relief with patches. Contemplating quitting.   Still feeling some persistent fatigue. Still same symptoms. Just has to stop to rest at times. Has fallen a few times - mechanical falls a few times, no injuries. No preceding near-syncope, no palpitations.  Cold natured. No hair or skin changes. No weight changes.  Wt Readings from Last 3 Encounters:  02/14/24 163 lb 12.8 oz (74.3 kg)  10/15/23 166 lb 11.2 oz (75.6 kg)  04/26/23 172 lb 9.6 oz (78.3 kg)   Lab Results  Component Value Date   TSH 2.08 12/23/2022    Depression: Last discussed in March 2024.  Treated with Effexor  75 mg daily, BuSpar  5 mg twice daily as needed and gabapentin  for restless leg syndrome. Taking 300mg  at night only. Occasional breakthrough sx's.   Taking buspar  BID -working well.       12/23/2022    8:41 AM 09/23/2022    9:46 AM 06/18/2022   11:28 AM 07/02/2021    9:16 AM 01/29/2021    9:42 AM  Depression screen PHQ 2/9  Decreased Interest 0 2 0 2 1  Down, Depressed, Hopeless 0 1 0 0 0  PHQ - 2 Score 0 3 0 2 1  Altered sleeping 1 1 0 2 3  Tired, decreased energy 1 2 0 3 3  Change in appetite 0 0 0 0 0  Feeling bad or failure about yourself  0 0 0 0 0  Trouble concentrating 0 0 0 0 0  Moving slowly or fidgety/restless 0 0 0 0 0  Suicidal thoughts 0 0 0 0 0  PHQ-9 Score 2 6 0 7 7  Difficult doing work/chores Not difficult at all    Somewhat difficult     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   Recurrent major depressive disorder, in partial remission (HCC) 04/12/2018   Arthritis 10/25/2016   RLS (restless legs syndrome) 03/19/2015   Lower back pain 07/09/2012   Hip pain 07/09/2012   Cervical radiculopathy 05/29/2012   Anxiety 05/29/2012   Insomnia 05/29/2012   BMI 31.0-31.9,adult 05/29/2012   Depression 12/17/2011   Past Medical History:  Diagnosis Date   Allergy    Arthritis    Past Surgical History:  Procedure Laterality Date   ABDOMINAL HYSTERECTOMY     CESAREAN SECTION     LEFT HEART CATH AND CORONARY ANGIOGRAPHY N/A 09/09/2022   Procedure: LEFT HEART CATH AND CORONARY ANGIOGRAPHY;  Surgeon: Knox Perl, MD;  Location: [facility];  Service: Cardiovascular;  Laterality: N/A;   TOTAL ABDOMINAL HYSTERECTOMY     Allergies  Allergen Reactions   Codeine Nausea And Vomiting   Prior to Admission medications   Medication Sig Start Date End Date Taking? Authorizing Provider  acetaminophen  (TYLENOL ) 325 MG tablet Take 325 mg by mouth every 6 (six) hours as needed for mild pain, fever or headache.    Yes [provider]  amLODipine  (NORVASC ) 5 MG tablet TAKE 1 TABLET (5 MG TOTAL) BY MOUTH DAILY. 02/08/24  Yes Sheryle Donning, MD  atorvastatin  (LIPITOR ) 80 MG tablet Take 1 tablet (80 mg total) by mouth daily. 09/12/22  Yes Pasqual Bone, MD  busPIRone  (BUSPAR ) 5 MG tablet TAKE 1 TABLET BY MOUTH TWICE A DAY AS NEEDED 02/07/24  Yes Benjiman Bras, MD  ENTRESTO  24-26 MG TAKE 1 TABLET BY MOUTH TWICE A DAY 01/10/24  Yes Sheryle Donning, MD  gabapentin  (NEURONTIN ) 300 MG capsule Take 1-2 capsules (300-600 mg total) by mouth at bedtime. 02/07/24  Yes Benjiman Bras, MD  isosorbide  mononitrate (IMDUR ) 30 MG 24 hr tablet TAKE 1 TABLET BY MOUTH EVERY DAY 12/13/23  Yes Sheryle Donning, MD  metoprolol  tartrate (LOPRESSOR ) 25 MG tablet TAKE 0.5 TABLETS BY MOUTH 2 TIMES DAILY. 01/10/24  Yes Sheryle Donning, MD  nitroGLYCERIN  (NITROSTAT ) 0.4 MG SL tablet Place 1 tablet (0.4 mg total) under the tongue every 5 (five) minutes as needed for chest pain. 09/11/22  Yes Pasqual Bone, MD  venlafaxine  XR (EFFEXOR -XR) 75 MG 24 hr capsule TAKE 3 CAPSULES (225 MG TOTAL) BY MOUTH DAILY WITH BREAKFAST 01/26/23  Yes Benjiman Bras, MD   Social History   Socioeconomic History   Marital status: Married    Spouse name: Not on file   Number of children: Not on file   Years of education: Not on file   Highest education level: Not on file  Occupational History   Not on file  Tobacco Use   Smoking status: Every Day    Current packs/day: 0.50    Types: Cigarettes   Smokeless tobacco: Never  Substance and Sexual Activity   Alcohol use: No   Drug use: No   Sexual activity: Yes    Birth control/protection: None  Other Topics Concern   Not on file  Social History Narrative   Not on file   Social Drivers of Health   Financial Resource Strain: Not on file  Food Insecurity: Not on file  Transportation Needs: Not on file  Physical Activity: Not on file  Stress: Not on file   Social Connections: Not on file  Intimate Partner Violence: Not on file    Review of Systems Per HPI.   Objective:   Vitals:   02/14/24 1133  BP: 116/70  Pulse: 65  Resp: 16  Temp: 98.2 F (36.8 C)  TempSrc: Temporal  SpO2: 98%  Weight: 163 lb 12.8 oz (74.3 kg)  Height: 5\' 3"  (1.6 m)     Physical Exam Vitals reviewed.  Constitutional:      Appearance: Normal appearance. She is well-developed.  HENT:     Head: Normocephalic and atraumatic.  Eyes:  Conjunctiva/sclera: Conjunctivae normal.     Pupils: Pupils are equal, round, and reactive to light.  Neck:     Vascular: No carotid bruit.  Cardiovascular:     Rate and Rhythm: Normal rate and regular rhythm.     Heart sounds: Normal heart sounds.  Pulmonary:     Effort: Pulmonary effort is normal.     Breath sounds: Normal breath sounds.  Abdominal:     Palpations: Abdomen is soft. There is no pulsatile mass.     Tenderness: There is no abdominal tenderness.  Musculoskeletal:     Right lower leg: No edema.     Left lower leg: No edema.  Skin:    General: Skin is warm and dry.  Neurological:     General: No focal deficit present.     Mental Status: She is alert and oriented to person, place, and time.     Gait: Gait normal.  Psychiatric:        Mood and Affect: Mood normal.        Behavior: Behavior normal.        Assessment & Plan:  Rachel Roth is a 58 y.o. female . Fatigue, unspecified type - Plan: Comprehensive metabolic panel with GFR, CBC, TSH  - Nonspecific fatigue, no recent changes.  Question deconditioning versus prior cardiac history as above but has seen cardiology recently as above.  Will check CBC, TSH, CMP, and follow-up to discuss further.  Spontaneous dissection of coronary artery  - Asymptomatic, cardiology follow-up as above, no med changes at this time.  Mixed hyperlipidemia - Plan: Lipid panel  - Off statin recently, check lipids and then adjust plan accordingly.  RLS  (restless legs syndrome)  - Decided to continue gabapentin  same dose for now, 1 month follow-up.  With her fatigue I am hesitant to increase dosing and overall controlled.  Anxiety and depression  - Reports controlled symptoms, continue same regimen, 1 month follow-up to discuss fatigue as above.  Tobacco use  - Cessation recommended, handout given on steps for quitting smoking and advised to let me know if I can assist including with any prescription meds to help with quitting smoking.  No orders of the defined types were placed in this encounter.  Patient Instructions  I will check labs, but please follow up to discuss fatigue in next month. Be seen sooner if any new/worsening symptoms.  See info below on quitting smoking and let me know if we can help.  Take care!  Steps to Quit Smoking Smoking tobacco is the leading cause of preventable death. It can affect almost every organ in the body. Smoking puts you and those around you at risk for developing many serious chronic diseases. Quitting smoking can be very challenging. Do not get discouraged if you are not successful the first time. Some people need to make many attempts to quit before they achieve long-term success. Do your best to stick to your quit plan, and talk with your health care provider if you have any questions or concerns. How do I get ready to quit? When you decide to quit smoking, create a plan to help you succeed. Before you quit: Pick a date to quit. Set a date within the next 2 weeks to give you time to prepare. Write down the reasons why you are quitting. Keep this list in places where you will see it often. Tell your family, friends, and co-workers that you are quitting. Support from people you are close to can  make quitting easier. Talk with your health care provider about your options for quitting smoking. Find out what treatment options are covered by your health insurance. Identify people, places, things, and  activities that make you want to smoke (triggers). Avoid them. What first steps can I take to quit smoking? Throw away all cigarettes at home, at work, and in your car. Throw away smoking accessories, such as Set designer. Clean your car. Make sure to empty the ashtray. Clean your home, including curtains and carpets. What strategies can I use to quit smoking? Talk with your health care provider about combining strategies, such as taking medicines while you are also receiving in-person counseling. Using these two strategies together makes you more likely to succeed in quitting than if you used either strategy on its own. If you are pregnant or breastfeeding, talk with your health care provider about finding counseling or other support strategies to quit smoking. Do not take medicine to help you quit smoking unless your health care provider tells you to. Quit right away Quit smoking completely, instead of gradually reducing how much you smoke over a period of time. Stopping smoking right away may be more successful than gradually quitting. Attend in-person counseling to help you build problem-solving skills. You are more likely to succeed in quitting if you attend counseling sessions regularly. Even short sessions of 10 minutes can be effective. Take medicine You may take medicines to help you quit smoking. Some medicines require a prescription. You can also purchase over-the-counter medicines. Medicines may have nicotine in them to replace the nicotine in cigarettes. Medicines may: Help to stop cravings. Help to relieve withdrawal symptoms. Your health care provider may recommend: Nicotine patches, gum, or lozenges. Nicotine inhalers or sprays. Non-nicotine medicine that you take by mouth. Find resources Find resources and support systems that can help you quit smoking and remain smoke-free after you quit. These resources are most helpful when you use them often. They include: Online  chats with a Veterinary surgeon. Telephone quitlines. Printed Materials engineer. Support groups or group counseling. Text messaging programs. Mobile phone apps or applications. Use apps that can help you stick to your quit plan by providing reminders, tips, and encouragement. Examples of free services include Quit Guide from the CDC and smokefree.gov  What can I do to make it easier to quit?  Reach out to your family and friends for support and encouragement. Call telephone quitlines, such as 1-800-QUIT-NOW, reach out to support groups, or work with a counselor for support. Ask people who smoke to avoid smoking around you. Avoid places that trigger you to smoke, such as bars, parties, or smoke-break areas at work. Spend time with people who do not smoke. Lessen the stress in your life. Stress can be a smoking trigger for some people. To lessen stress, try: Exercising regularly. Doing deep-breathing exercises. Doing yoga. Meditating. What benefits will I see if I quit smoking? Over time, you should start to see positive results, such as: Improved sense of smell and taste. Decreased coughing and sore throat. Slower heart rate. Lower blood pressure. Clearer and healthier skin. The ability to breathe more easily. Fewer sick days. Summary Quitting smoking can be very challenging. Do not get discouraged if you are not successful the first time. Some people need to make many attempts to quit before they achieve long-term success. When you decide to quit smoking, create a plan to help you succeed. Quit smoking right away, not slowly over a period of time. Find resources  and support systems that can help you quit smoking and remain smoke-free after you quit. This information is not intended to replace advice given to you by your health care provider. Make sure you discuss any questions you have with your health care provider. Document Revised: 09/05/2021 Document Reviewed: 09/05/2021 Elsevier  Patient Education  2024 Elsevier Inc.  Fatigue If you have fatigue, you feel tired all the time and have a lack of energy or a lack of motivation. Fatigue may make it difficult to start or complete tasks because of exhaustion. Occasional or mild fatigue is often a normal response to activity or life. However, long-term (chronic) or extreme fatigue may be a symptom of a medical condition such as: Depression. Not having enough red blood cells or hemoglobin in the blood (anemia). A problem with a small gland located in the lower front part of the neck (thyroid  disorder). Rheumatologic conditions. These are problems related to the body's defense system (immune system). Infections, especially certain viral infections. Fatigue can also lead to negative health outcomes over time. Follow these instructions at home: Medicines Take over-the-counter and prescription medicines only as told by your health care provider. Take a multivitamin if told by your health care provider. Do not use herbal or dietary supplements unless they are approved by your health care provider. Eating and drinking  Avoid heavy meals in the evening. Eat a well-balanced diet, which includes lean proteins, whole grains, plenty of fruits and vegetables, and low-fat dairy products. Avoid eating or drinking too many products with caffeine in them. Avoid alcohol. Drink enough fluid to keep your urine pale yellow. Activity  Exercise regularly, as told by your health care provider. Use or practice techniques to help you relax, such as yoga, tai chi, meditation, or massage therapy. Lifestyle Change situations that cause you stress. Try to keep your work and personal schedules in balance. Do not use recreational or illegal drugs. General instructions Monitor your fatigue for any changes. Go to bed and get up at the same time every day. Avoid fatigue by pacing yourself during the day and getting enough sleep at night. Maintain a  healthy weight. Contact a health care provider if: Your fatigue does not get better. You have a fever. You suddenly lose or gain weight. You have headaches. You have trouble falling asleep or sleeping through the night. You feel angry, guilty, anxious, or sad. You have swelling in your legs or another part of your body. Get help right away if: You feel confused, feel like you might faint, or faint. Your vision is blurry or you have a severe headache. You have severe pain in your abdomen, your back, or the area between your waist and hips (pelvis). You have chest pain, shortness of breath, or an irregular or fast heartbeat. You are unable to urinate, or you urinate less than normal. You have abnormal bleeding from the rectum, nose, lungs, nipples, or, if you are female, the vagina. You vomit blood. You have thoughts about hurting yourself or others. These symptoms may be an emergency. Get help right away. Call 911. Do not wait to see if the symptoms will go away. Do not drive yourself to the hospital. Get help right away if you feel like you may hurt yourself or others, or have thoughts about taking your own life. Go to your nearest emergency room or: Call 911. Call the National Suicide Prevention Lifeline at 5140940480 or 988. This is open 24 hours a day. Text the Crisis Text Line  at 6624225251. Summary If you have fatigue, you feel tired all the time and have a lack of energy or a lack of motivation. Fatigue may make it difficult to start or complete tasks because of exhaustion. Long-term (chronic) or extreme fatigue may be a symptom of a medical condition. Exercise regularly, as told by your health care provider. Change situations that cause you stress. Try to keep your work and personal schedules in balance. This information is not intended to replace advice given to you by your health care provider. Make sure you discuss any questions you have with your health care  provider. Document Revised: 07/07/2021 Document Reviewed: 07/07/2021 Elsevier Patient Education  2024 Elsevier Inc.    Signed,   Caro Christmas, MD [facility], [facility] 02/14/24 12:46 PM

## 2024-02-14 NOTE — Patient Instructions (Addendum)
 I will check labs, but please follow up to discuss fatigue in next month. Be seen sooner if any new/worsening symptoms.  See info below on quitting smoking and let me know if we can help.  Take care!  Steps to Quit Smoking Smoking tobacco is the leading cause of preventable death. It can affect almost every organ in the body. Smoking puts you and those around you at risk for developing many serious chronic diseases. Quitting smoking can be very challenging. Do not get discouraged if you are not successful the first time. Some people need to make many attempts to quit before they achieve long-term success. Do your best to stick to your quit plan, and talk with your health care provider if you have any questions or concerns. How do I get ready to quit? When you decide to quit smoking, create a plan to help you succeed. Before you quit: Pick a date to quit. Set a date within the next 2 weeks to give you time to prepare. Write down the reasons why you are quitting. Keep this list in places where you will see it often. Tell your family, friends, and co-workers that you are quitting. Support from people you are close to can make quitting easier. Talk with your health care provider about your options for quitting smoking. Find out what treatment options are covered by your health insurance. Identify people, places, things, and activities that make you want to smoke (triggers). Avoid them. What first steps can I take to quit smoking? Throw away all cigarettes at home, at work, and in your car. Throw away smoking accessories, such as Set designer. Clean your car. Make sure to empty the ashtray. Clean your home, including curtains and carpets. What strategies can I use to quit smoking? Talk with your health care provider about combining strategies, such as taking medicines while you are also receiving in-person counseling. Using these two strategies together makes you more likely to succeed in  quitting than if you used either strategy on its own. If you are pregnant or breastfeeding, talk with your health care provider about finding counseling or other support strategies to quit smoking. Do not take medicine to help you quit smoking unless your health care provider tells you to. Quit right away Quit smoking completely, instead of gradually reducing how much you smoke over a period of time. Stopping smoking right away may be more successful than gradually quitting. Attend in-person counseling to help you build problem-solving skills. You are more likely to succeed in quitting if you attend counseling sessions regularly. Even short sessions of 10 minutes can be effective. Take medicine You may take medicines to help you quit smoking. Some medicines require a prescription. You can also purchase over-the-counter medicines. Medicines may have nicotine in them to replace the nicotine in cigarettes. Medicines may: Help to stop cravings. Help to relieve withdrawal symptoms. Your health care provider may recommend: Nicotine patches, gum, or lozenges. Nicotine inhalers or sprays. Non-nicotine medicine that you take by mouth. Find resources Find resources and support systems that can help you quit smoking and remain smoke-free after you quit. These resources are most helpful when you use them often. They include: Online chats with a Veterinary surgeon. Telephone quitlines. Printed Materials engineer. Support groups or group counseling. Text messaging programs. Mobile phone apps or applications. Use apps that can help you stick to your quit plan by providing reminders, tips, and encouragement. Examples of free services include Quit Guide from the CDC and smokefree.gov  What can I do to make it easier to quit?  Reach out to your family and friends for support and encouragement. Call telephone quitlines, such as 1-800-QUIT-NOW, reach out to support groups, or work with a counselor for support. Ask  people who smoke to avoid smoking around you. Avoid places that trigger you to smoke, such as bars, parties, or smoke-break areas at work. Spend time with people who do not smoke. Lessen the stress in your life. Stress can be a smoking trigger for some people. To lessen stress, try: Exercising regularly. Doing deep-breathing exercises. Doing yoga. Meditating. What benefits will I see if I quit smoking? Over time, you should start to see positive results, such as: Improved sense of smell and taste. Decreased coughing and sore throat. Slower heart rate. Lower blood pressure. Clearer and healthier skin. The ability to breathe more easily. Fewer sick days. Summary Quitting smoking can be very challenging. Do not get discouraged if you are not successful the first time. Some people need to make many attempts to quit before they achieve long-term success. When you decide to quit smoking, create a plan to help you succeed. Quit smoking right away, not slowly over a period of time. Find resources and support systems that can help you quit smoking and remain smoke-free after you quit. This information is not intended to replace advice given to you by your health care provider. Make sure you discuss any questions you have with your health care provider. Document Revised: 09/05/2021 Document Reviewed: 09/05/2021 Elsevier Patient Education  2024 [employer]  Fatigue If you have fatigue, you feel tired all the time and have a lack of energy or a lack of motivation. Fatigue may make it difficult to start or complete tasks because of exhaustion. Occasional or mild fatigue is often a normal response to activity or life. However, long-term (chronic) or extreme fatigue may be a symptom of a medical condition such as: Depression. Not having enough red blood cells or hemoglobin in the blood (anemia). A problem with a small gland located in the lower front part of the neck (thyroid   disorder). Rheumatologic conditions. These are problems related to the body's defense system (immune system). Infections, especially certain viral infections. Fatigue can also lead to negative health outcomes over time. Follow these instructions at home: Medicines Take over-the-counter and prescription medicines only as told by your health care provider. Take a multivitamin if told by your health care provider. Do not use herbal or dietary supplements unless they are approved by your health care provider. Eating and drinking  Avoid heavy meals in the evening. Eat a well-balanced diet, which includes lean proteins, whole grains, plenty of fruits and vegetables, and low-fat dairy products. Avoid eating or drinking too many products with caffeine in them. Avoid alcohol. Drink enough fluid to keep your urine pale yellow. Activity  Exercise regularly, as told by your health care provider. Use or practice techniques to help you relax, such as yoga, tai chi, meditation, or massage therapy. Lifestyle Change situations that cause you stress. Try to keep your work and personal schedules in balance. Do not use recreational or illegal drugs. General instructions Monitor your fatigue for any changes. Go to bed and get up at the same time every day. Avoid fatigue by pacing yourself during the day and getting enough sleep at night. Maintain a healthy weight. Contact a health care provider if: Your fatigue does not get better. You have a fever. You suddenly lose or gain weight. You  have headaches. You have trouble falling asleep or sleeping through the night. You feel angry, guilty, anxious, or sad. You have swelling in your legs or another part of your body. Get help right away if: You feel confused, feel like you might faint, or faint. Your vision is blurry or you have a severe headache. You have severe pain in your abdomen, your back, or the area between your waist and hips (pelvis). You  have chest pain, shortness of breath, or an irregular or fast heartbeat. You are unable to urinate, or you urinate less than normal. You have abnormal bleeding from the rectum, nose, lungs, nipples, or, if you are female, the vagina. You vomit blood. You have thoughts about hurting yourself or others. These symptoms may be an emergency. Get help right away. Call 911. Do not wait to see if the symptoms will go away. Do not drive yourself to the hospital. Get help right away if you feel like you may hurt yourself or others, or have thoughts about taking your own life. Go to your nearest emergency room or: Call 911. Call the National Suicide Prevention Lifeline at 463-122-2603 or 988. This is open 24 hours a day. Text the Crisis Text Line at 323-518-8700. Summary If you have fatigue, you feel tired all the time and have a lack of energy or a lack of motivation. Fatigue may make it difficult to start or complete tasks because of exhaustion. Long-term (chronic) or extreme fatigue may be a symptom of a medical condition. Exercise regularly, as told by your health care provider. Change situations that cause you stress. Try to keep your work and personal schedules in balance. This information is not intended to replace advice given to you by your health care provider. Make sure you discuss any questions you have with your health care provider. Document Revised: 07/07/2021 Document Reviewed: 07/07/2021 Elsevier Patient Education  2024 [employer].

## 2024-02-15 LAB — TSH: TSH: 1.51 u[IU]/mL (ref 0.35–5.50)

## 2024-02-19 ENCOUNTER — Ambulatory Visit: Payer: Self-pay | Admitting: Family Medicine

## 2024-02-19 DIAGNOSIS — E782 Mixed hyperlipidemia: Secondary | ICD-10-CM

## 2024-02-22 MED ORDER — ATORVASTATIN CALCIUM 20 MG PO TABS: 20.0000 mg | ORAL_TABLET | Freq: Every day | ORAL | 1 refills | Status: DC

## 2024-03-27 ENCOUNTER — Other Ambulatory Visit (INDEPENDENT_AMBULATORY_CARE_PROVIDER_SITE_OTHER)

## 2024-03-27 DIAGNOSIS — E782 Mixed hyperlipidemia: Secondary | ICD-10-CM | POA: Diagnosis not present

## 2024-03-27 LAB — HEPATIC FUNCTION PANEL
ALT: 8 U/L (ref 0–35)
AST: 10 U/L (ref 0–37)
Albumin: 4.1 g/dL (ref 3.5–5.2)
Alkaline Phosphatase: 121 U/L — ABNORMAL HIGH (ref 39–117)
Bilirubin, Direct: 0.1 mg/dL (ref 0.0–0.3)
Total Bilirubin: 0.4 mg/dL (ref 0.2–1.2)
Total Protein: 6.6 g/dL (ref 6.0–8.3)

## 2024-03-27 LAB — LIPID PANEL
Cholesterol: 126 mg/dL (ref 0–200)
HDL: 38.9 mg/dL — ABNORMAL LOW (ref >39.00)
LDL Cholesterol: 56 mg/dL (ref 0–99)
NonHDL: 87.58
Total CHOL/HDL Ratio: 3
Triglycerides: 158 mg/dL — ABNORMAL HIGH (ref 0.0–149.0)
VLDL: 31.6 mg/dL (ref 0.0–40.0)

## 2024-03-28 ENCOUNTER — Ambulatory Visit: Payer: Self-pay | Admitting: Family Medicine

## 2024-03-29 ENCOUNTER — Ambulatory Visit: Admitting: Family Medicine

## 2024-03-29 ENCOUNTER — Encounter: Payer: Self-pay | Admitting: Family Medicine

## 2024-03-29 VITALS — BP 118/66 | HR 75 | Temp 98.2°F | Resp 16 | Ht 63.0 in | Wt 161.4 lb

## 2024-03-29 DIAGNOSIS — E782 Mixed hyperlipidemia: Secondary | ICD-10-CM

## 2024-03-29 DIAGNOSIS — R5383 Other fatigue: Secondary | ICD-10-CM | POA: Diagnosis not present

## 2024-03-29 DIAGNOSIS — R052 Subacute cough: Secondary | ICD-10-CM

## 2024-03-29 DIAGNOSIS — Z720 Tobacco use: Secondary | ICD-10-CM | POA: Diagnosis not present

## 2024-03-29 MED ORDER — VARENICLINE TARTRATE (STARTER) 0.5 MG X 11 & 1 MG X 42 PO TBPK
ORAL_TABLET | ORAL | 0 refills | Status: AC
Start: 2024-03-29 — End: ?

## 2024-03-29 MED ORDER — VARENICLINE TARTRATE 1 MG PO TABS
1.0000 mg | ORAL_TABLET | Freq: Two times a day (BID) | ORAL | 1 refills | Status: AC
Start: 2024-03-29 — End: ?

## 2024-03-29 NOTE — Patient Instructions (Addendum)
 Thanks for coming in today.  I do recommend reaching out to your cardiologist about follow-up appointment as I do not see one scheduled at this time.  I would recommend letting them know about the fatigue that you are having but it sounds like that is similar to the symptoms you had in January along with your chest symptoms.  If any new or worsening symptoms be seen right away.  For the cough and fatigue, I will order chest x-ray but also will refer you to pulmonary to see if they think lung function testing is indicated or other recommendations given your smoking history.  For smoking, I did send in the Chantix, see information below.  Let me know if you have any concerns with that medication or new side effects other than what we discussed.  If other resources needed, please let me know.  [facility] or xray: Walk in 8:30-4:30 during weekdays, no appointment needed [address]  [place], [place] [address]   Varenicline Tablets What is this medication? VARENICLINE (var e NI kleen) helps you quit smoking. It reduces cravings for nicotine, the addictive substance found in tobacco. It is most effective when used in combination with a stop-smoking program. This medicine may be used for other purposes; ask your health care provider or pharmacist if you have questions. COMMON BRAND NAME(S): Chantix What should I tell my care team before I take this medication? They need to know if you have any of these conditions: Heart disease Frequently drink alcohol Kidney disease Mental health condition On hemodialysis Seizures History of stroke Suicidal thoughts, plans, or attempt by you or a family member An unusual or allergic reaction to varenicline, other medications, foods, dyes, or preservatives Pregnant or trying to get pregnant Breast-feeding How should I use this medication? Take this medication by mouth after eating. Take with a full glass of water. Follow the directions on the prescription  label. Take your doses at regular intervals. Do not take your medication more often than directed. There are 3 ways you can use this medication to help you quit smoking; talk to your care team to decide which plan is right for you: 1) you can choose a quit date and start this medication 1 week before the quit date, or, 2) you can start taking this medication before you choose a quit date, and then pick a quit date between day 8 and 35 days of treatment, or, 3) if you are not sure that you are able or willing to quit smoking right away, start taking this medication and slowly decrease the amount you smoke as directed by your care team with the goal of being cigarette-free by week 12 of treatment. Stick to your plan; ask about support groups or other ways to help you remain cigarette-free. If you are motivated to quit smoking and did not succeed during a previous attempt with this medication for reasons other than side effects, or if you returned to smoking after this treatment, speak with your care team about whether another course of this medication may be right for you. A special MedGuide will be given to you by the pharmacist with each prescription and refill. Be sure to read this information carefully each time. Talk to your care team about the use of this medication in children. This medication is not approved for use in children. Overdosage: If you think you have taken too much of this medicine contact a poison control center or emergency room at once. NOTE:  This medicine is only for you. Do not share this medicine with others. What if I miss a dose? If you miss a dose, take it as soon as you can. If it is almost time for your next dose, take only that dose. Do not take double or extra doses. What may interact with this medication? Alcohol Insulin Other medications used to help people quit smoking Theophylline Warfarin This list may not describe all possible interactions. Give your health care  provider a list of all the medicines, herbs, non-prescription drugs, or dietary supplements you use. Also tell them if you smoke, drink alcohol, or use illegal drugs. Some items may interact with your medicine. What should I watch for while using this medication? It is okay if you do not succeed at your attempt to quit and have a cigarette. You can still continue your quit attempt and keep using this medication as directed. Just throw away your cigarettes and get back to your quit plan. Talk to your care team before using other treatments to quit smoking. Using this medication with other treatments to quit smoking may increase the risk for side effects compared to using a treatment alone. This medication may affect your coordination, reaction time, or judgment. Do not drive or operate machinery until you know how this medication affects you. Sit up or stand slowly to reduce the risk of dizzy or fainting spells. Decrease the number of alcoholic beverages that you drink during treatment with this medication until you know if this medication affects your ability to tolerate alcohol. Some people have experienced increased drunkenness (intoxication), unusual or sometimes aggressive behavior, or no memory of things that have happened (amnesia) during treatment with this medication. You may do unusual sleep behaviors or activities you do not remember the day after taking this medication. Activities include driving, making or eating food, talking on the phone, sexual activity, or sleep walking. Stop taking this medication and call your care team right away if you find out you have done activities like this. Patients and their families should watch out for new or worsening depression or thoughts of suicide. Also watch out for sudden changes in feelings such as feeling anxious, agitated, panicky, irritable, hostile, aggressive, impulsive, severely restless, overly excited and hyperactive, or not being able to sleep. If  this happens, call your care team. If you have diabetes, and you quit smoking, the effects of insulin may be increased. You may need to reduce your insulin dose. Check with your care team about how you should adjust your insulin dose. What side effects may I notice from receiving this medication? Side effects that you should report to your care team as soon as possible: Allergic reactions or angioedema--skin rash, itching or hives, swelling of the face, eyes, lips, tongue, arms, or legs, trouble swallowing or breathing Heart attack--pain or tightness in the chest, shoulders, arms, or jaw, nausea, shortness of breath, cold or clammy skin, feeling faint or lightheaded Mood and behavior changes--anxiety, nervousness, confusion, hallucinations, irritability, hostility, thoughts of suicide or self-harm, worsening mood, feelings of depression Redness, blistering, peeling, or loosening of the skin, including inside the mouth Stroke--sudden numbness or weakness of the face, arm, or leg, trouble speaking, confusion, trouble walking, loss of balance or coordination, dizziness, severe headache, change in vision Seizures Side effects that usually do not require medical attention (report to your care team if they continue or are bothersome): Constipation Drowsiness Gas Nausea Trouble sleeping Upset stomach Vivid dreams or nightmares Vomiting This list may  not describe all possible side effects. Call your doctor for medical advice about side effects. You may report side effects to FDA at 1-800-FDA-1088. Where should I keep my medication? Keep out of the reach of children and pets. Store at room temperature between 15 and 30 degrees C (59 and 86 degrees F). Throw away any unused medication after the expiration date. NOTE: This sheet is a summary. It may not cover all possible information. If you have questions about this medicine, talk to your doctor, pharmacist, or health care provider.  2024  Elsevier/Gold Standard (2021-08-07 00:00:00)   Fatigue If you have fatigue, you feel tired all the time and have a lack of energy or a lack of motivation. Fatigue may make it difficult to start or complete tasks because of exhaustion. Occasional or mild fatigue is often a normal response to activity or life. However, long-term (chronic) or extreme fatigue may be a symptom of a medical condition such as: Depression. Not having enough red blood cells or hemoglobin in the blood (anemia). A problem with a small gland located in the lower front part of the neck (thyroid  disorder). Rheumatologic conditions. These are problems related to the body's defense system (immune system). Infections, especially certain viral infections. Fatigue can also lead to negative health outcomes over time. Follow these instructions at home: Medicines Take over-the-counter and prescription medicines only as told by your health care provider. Take a multivitamin if told by your health care provider. Do not use herbal or dietary supplements unless they are approved by your health care provider. Eating and drinking  Avoid heavy meals in the evening. Eat a well-balanced diet, which includes lean proteins, whole grains, plenty of fruits and vegetables, and low-fat dairy products. Avoid eating or drinking too many products with caffeine in them. Avoid alcohol. Drink enough fluid to keep your urine pale yellow. Activity  Exercise regularly, as told by your health care provider. Use or practice techniques to help you relax, such as yoga, tai chi, meditation, or massage therapy. Lifestyle Change situations that cause you stress. Try to keep your work and personal schedules in balance. Do not use recreational or illegal drugs. General instructions Monitor your fatigue for any changes. Go to bed and get up at the same time every day. Avoid fatigue by pacing yourself during the day and getting enough sleep at  night. Maintain a healthy weight. Contact a health care provider if: Your fatigue does not get better. You have a fever. You suddenly lose or gain weight. You have headaches. You have trouble falling asleep or sleeping through the night. You feel angry, guilty, anxious, or sad. You have swelling in your legs or another part of your body. Get help right away if: You feel confused, feel like you might faint, or faint. Your vision is blurry or you have a severe headache. You have severe pain in your abdomen, your back, or the area between your waist and hips (pelvis). You have chest pain, shortness of breath, or an irregular or fast heartbeat. You are unable to urinate, or you urinate less than normal. You have abnormal bleeding from the rectum, nose, lungs, nipples, or, if you are female, the vagina. You vomit blood. You have thoughts about hurting yourself or others. These symptoms may be an emergency. Get help right away. Call 911. Do not wait to see if the symptoms will go away. Do not drive yourself to the hospital. Get help right away if you feel like you may hurt  yourself or others, or have thoughts about taking your own life. Go to your nearest emergency room or: Call 911. Call the National Suicide Prevention Lifeline at 6783050628 or 988. This is open 24 hours a day. Text the Crisis Text Line at (985) 627-1305. Summary If you have fatigue, you feel tired all the time and have a lack of energy or a lack of motivation. Fatigue may make it difficult to start or complete tasks because of exhaustion. Long-term (chronic) or extreme fatigue may be a symptom of a medical condition. Exercise regularly, as told by your health care provider. Change situations that cause you stress. Try to keep your work and personal schedules in balance. This information is not intended to replace advice given to you by your health care provider. Make sure you discuss any questions you have with your health  care provider. Document Revised: 07/07/2021 Document Reviewed: 07/07/2021 Elsevier Patient Education  2024 [employer].

## 2024-03-29 NOTE — Progress Notes (Signed)
 Subjective:  Patient ID: Rachel Roth, female    DOB: [birth year]  Age: 57 y.o. MRN: [id number]  CC:  Chief Complaint  Patient presents with   Fatigue    Pt notes feels about the same     HPI Rachel Roth presents for   Fatigue Discussed at May 19 visit.  Followed by cardiologist with last visit in January.  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 Denies recent chest pains.  Echo with EF 55 to 60% last May.  General fatigue was discussed in the past without limitations of activity.  She had been off her statin for about a year and discussed last visit, recommended restart.  Did have some persistent fatigue, stopping to rest at times but no acute changes.  Her CBC, CMP, and TSH were normal.  Lipids were elevated with LDL 101 low HDL of 37.  Repeat lipids on June 30, LDL 56, HDL 38.  LFTs were stable on labs 2 days ago. She does still smoke, cessation has been recommended and handout given on cessation resources. Rare cough. Rare phlegm, but no dyspnea. Past year.   Prior quit attempt 30 years ago, cold [place] - off for years. Would like to consider Chantix. Possible side effects/risks discussed.   No new side effects on statin.  Fatigue feels about the same. Still able to do some activity outside. No dyspnea, just fatigue. Takes a break after about of activity.  Sore in upper chest with certain types of physical activity - same pain as when discussed in January with cardiology.  Fatigue about the same as when discussed with cardiology in January.  Notes some fatigue with working in the heat.     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   Recurrent major depressive disorder, in  partial remission (HCC) 04/12/2018   Arthritis 10/25/2016   RLS (restless legs syndrome) 03/19/2015   Lower back pain 07/09/2012   Hip pain 07/09/2012   Cervical radiculopathy 05/29/2012   Anxiety 05/29/2012   Insomnia 05/29/2012   BMI 31.0-31.9,adult 05/29/2012   Depression 12/17/2011   Past Medical History:  Diagnosis Date   Allergy    Arthritis    Past Surgical History:  Procedure Laterality Date   ABDOMINAL HYSTERECTOMY     CESAREAN SECTION     LEFT HEART CATH AND CORONARY ANGIOGRAPHY N/A 09/09/2022   Procedure: LEFT HEART CATH AND CORONARY ANGIOGRAPHY;  Surgeon: Ladona Heinz, MD;  Location: [facility];  Service: Cardiovascular;  Laterality: N/A;   TOTAL ABDOMINAL HYSTERECTOMY     Allergies  Allergen Reactions   Codeine Nausea And Vomiting   Prior to Admission medications   Medication Sig Start Date End Date Taking? Authorizing Provider  acetaminophen  (TYLENOL ) 325 MG tablet Take 325 mg by mouth every 6 (six) hours as needed for mild pain, fever or headache.    [provider]  amLODipine  (NORVASC ) 5 MG tablet TAKE 1 TABLET (5 MG TOTAL) BY MOUTH DAILY. 02/08/24   Lonni Slain, MD  atorvastatin  (LIPITOR ) 20 MG tablet Take 1 tablet (20 mg total) by mouth daily. 02/22/24   Levora Reyes SAUNDERS, MD  atorvastatin  (LIPITOR ) 80 MG tablet Take 1 tablet (80 mg total) by  mouth daily. 09/12/22   Claudene Pacific, MD  busPIRone  (BUSPAR ) 5 MG tablet TAKE 1 TABLET BY MOUTH TWICE A DAY AS NEEDED 02/07/24   Levora Reyes SAUNDERS, MD  ENTRESTO  24-26 MG TAKE 1 TABLET BY MOUTH TWICE A DAY 01/10/24   Lonni Slain, MD  gabapentin  (NEURONTIN ) 300 MG capsule Take 1-2 capsules (300-600 mg total) by mouth at bedtime. 02/07/24   Levora Reyes SAUNDERS, MD  isosorbide  mononitrate (IMDUR ) 30 MG 24 hr tablet TAKE 1 TABLET BY MOUTH EVERY DAY 12/13/23   Lonni Slain, MD  metoprolol  tartrate (LOPRESSOR ) 25 MG tablet TAKE 0.5 TABLETS BY MOUTH 2 TIMES DAILY. 01/10/24   Lonni Slain, MD  nitroGLYCERIN  (NITROSTAT ) 0.4 MG SL tablet Place 1 tablet (0.4 mg total) under the tongue every 5 (five) minutes as needed for chest pain. 09/11/22   Claudene Pacific, MD  venlafaxine  XR (EFFEXOR -XR) 75 MG 24 hr capsule Take 3 capsules (225 mg total) by mouth daily with breakfast. 02/14/24   Levora Reyes SAUNDERS, MD   Social History   Socioeconomic History   Marital status: Married    Spouse name: Not on file   Number of children: Not on file   Years of education: Not on file   Highest education level: Not on file  Occupational History   Not on file  Tobacco Use   Smoking status: Every Day    Current packs/day: 0.50    Types: Cigarettes   Smokeless tobacco: Never  Substance and Sexual Activity   Alcohol use: No   Drug use: No   Sexual activity: Yes    Birth control/protection: None  Other Topics Concern   Not on file  Social History Narrative   Not on file   Social Drivers of Health   Financial Resource Strain: Not on file  Food Insecurity: Not on file  Transportation Needs: Not on file  Physical Activity: Not on file  Stress: Not on file  Social Connections: Not on file  Intimate Partner Violence: Not on file    Review of Systems   Objective:   Vitals:   03/29/24 0925  BP: 118/66  Pulse: 75  Resp: 16  Temp: 98.2 F (36.8 C)  TempSrc: Temporal  SpO2: 98%  Weight: 161 lb 6.4 oz (73.2 kg)  Height: 5' 3 (1.6 m)     Physical Exam Vitals reviewed.  Constitutional:      Appearance: Normal appearance. She is well-developed.  HENT:     Head: Normocephalic and atraumatic.  Eyes:     Conjunctiva/sclera: Conjunctivae normal.     Pupils: Pupils are equal, round, and reactive to light.  Neck:     Vascular: No carotid bruit.  Cardiovascular:     Rate and Rhythm: Normal rate and regular rhythm.     Heart sounds: Normal heart sounds.  Pulmonary:     Effort: Pulmonary effort is normal.     Breath sounds: Normal breath sounds.  Abdominal:      Palpations: Abdomen is soft. There is no pulsatile mass.     Tenderness: There is no abdominal tenderness.  Musculoskeletal:     Right lower leg: No edema.     Left lower leg: No edema.  Skin:    General: Skin is warm and dry.  Neurological:     Mental Status: She is alert and oriented to person, place, and time.  Psychiatric:        Mood and Affect: Mood normal.        Behavior:  Behavior normal.        Assessment & Plan:  Rachel Roth is a 58 y.o. female . Fatigue, unspecified type - Plan: Ambulatory referral to Pulmonology, DG Chest 2 View  - Possible multiple contributors.  Denies acute changes in fatigue or chest symptoms but did recommend she follow-up with cardiology to decide if other testing indicated.  Lungs were clear on exam, reassuring recent lab work.  With the intermittent cough and tobacco use history, will check a chest x-ray and referred to pulmonary to decide on pulmonary function testing or other evaluation for the cough, fatigue.  RTC/ER precautions given.  Tobacco use - Plan: Ambulatory referral to Pulmonology, DG Chest 2 View, varenicline (CHANTIX CONTINUING MONTH PAK) 1 MG tablet, Varenicline Tartrate, Starter, (CHANTIX STARTING MONTH PAK) 0.5 MG X 11 & 1 MG X 42 TBPK  - Smoking cessation recommended as well as different options, she would like to try Chantix if covered by insurance, potential side effects and risk discussed, duration of medication discussed with RTC precautions.  Handout given.  Subacute cough - Plan: Ambulatory referral to Pulmonology, DG Chest 2 View  - Check chest x-ray, lungs clear, refer to pulmonary as above.  Mixed hyperlipidemia  - Improved on repeat testing on statin, tolerating statin, continue same.  30-month follow-up  Meds ordered this encounter  Medications   varenicline (CHANTIX CONTINUING MONTH PAK) 1 MG tablet    Sig: Take 1 tablet (1 mg total) by mouth 2 (two) times daily.    Dispense:  60 tablet    Refill:  1    Varenicline Tartrate, Starter, (CHANTIX STARTING MONTH PAK) 0.5 MG X 11 & 1 MG X 42 TBPK    Sig: Take one 0.5 mg tablet by mouth once daily for 3 days, then increase to one 0.5 mg tablet twice daily for 4 days, then increase to one 1 mg tablet twice daily.    Dispense:  53 each    Refill:  0   Patient Instructions  Thanks for coming in today.  I do recommend reaching out to your cardiologist about follow-up appointment as I do not see one scheduled at this time.  I would recommend letting them know about the fatigue that you are having but it sounds like that is similar to the symptoms you had in January along with your chest symptoms.  If any new or worsening symptoms be seen right away.  For the cough and fatigue, I will order chest x-ray but also will refer you to pulmonary to see if they think lung function testing is indicated or other recommendations given your smoking history.  For smoking, I did send in the Chantix, see information below.  Let me know if you have any concerns with that medication or new side effects other than what we discussed.  If other resources needed, please let me know.  [facility] or xray: Walk in 8:30-4:30 during weekdays, no appointment needed [address]  [place],  [id number]   Varenicline Tablets What is this medication? VARENICLINE (var e NI kleen) helps you quit smoking. It reduces cravings for nicotine, the addictive substance found in tobacco. It is most effective when used in combination with a stop-smoking program. This medicine may be used for other purposes; ask your health care provider or pharmacist if you have questions. COMMON BRAND NAME(S): Chantix What should I tell my care team before I take this medication? They need to know if you have any of these  conditions: Heart disease Frequently drink alcohol Kidney disease Mental health condition On hemodialysis Seizures History of stroke Suicidal thoughts, plans, or attempt by you or  a family member An unusual or allergic reaction to varenicline, other medications, foods, dyes, or preservatives Pregnant or trying to get pregnant Breast-feeding How should I use this medication? Take this medication by mouth after eating. Take with a full glass of water. Follow the directions on the prescription label. Take your doses at regular intervals. Do not take your medication more often than directed. There are 3 ways you can use this medication to help you quit smoking; talk to your care team to decide which plan is right for you: 1) you can choose a quit date and start this medication 1 week before the quit date, or, 2) you can start taking this medication before you choose a quit date, and then pick a quit date between day 8 and 35 days of treatment, or, 3) if you are not sure that you are able or willing to quit smoking right away, start taking this medication and slowly decrease the amount you smoke as directed by your care team with the goal of being cigarette-free by week 12 of treatment. Stick to your plan; ask about support groups or other ways to help you remain cigarette-free. If you are motivated to quit smoking and did not succeed during a previous attempt with this medication for reasons other than side effects, or if you returned to smoking after this treatment, speak with your care team about whether another course of this medication may be right for you. A special MedGuide will be given to you by the pharmacist with each prescription and refill. Be sure to read this information carefully each time. Talk to your care team about the use of this medication in children. This medication is not approved for use in children. Overdosage: If you think you have taken too much of this medicine contact a poison control center or emergency room at once. NOTE: This medicine is only for you. Do not share this medicine with others. What if I miss a dose? If you miss a dose, take it as soon  as you can. If it is almost time for your next dose, take only that dose. Do not take double or extra doses. What may interact with this medication? Alcohol Insulin Other medications used to help people quit smoking Theophylline Warfarin This list may not describe all possible interactions. Give your health care provider a list of all the medicines, herbs, non-prescription drugs, or dietary supplements you use. Also tell them if you smoke, drink alcohol, or use illegal drugs. Some items may interact with your medicine. What should I watch for while using this medication? It is okay if you do not succeed at your attempt to quit and have a cigarette. You can still continue your quit attempt and keep using this medication as directed. Just throw away your cigarettes and get back to your quit plan. Talk to your care team before using other treatments to quit smoking. Using this medication with other treatments to quit smoking may increase the risk for side effects compared to using a treatment alone. This medication may affect your coordination, reaction time, or judgment. Do not drive or operate machinery until you know how this medication affects you. Sit up or stand slowly to reduce the risk of dizzy or fainting spells. Decrease the number of alcoholic beverages that you drink during treatment with this medication until  you know if this medication affects your ability to tolerate alcohol. Some people have experienced increased drunkenness (intoxication), unusual or sometimes aggressive behavior, or no memory of things that have happened (amnesia) during treatment with this medication. You may do unusual sleep behaviors or activities you do not remember the day after taking this medication. Activities include driving, making or eating food, talking on the phone, sexual activity, or sleep walking. Stop taking this medication and call your care team right away if you find out you have done activities like  this. Patients and their families should watch out for new or worsening depression or thoughts of suicide. Also watch out for sudden changes in feelings such as feeling anxious, agitated, panicky, irritable, hostile, aggressive, impulsive, severely restless, overly excited and hyperactive, or not being able to sleep. If this happens, call your care team. If you have diabetes, and you quit smoking, the effects of insulin may be increased. You may need to reduce your insulin dose. Check with your care team about how you should adjust your insulin dose. What side effects may I notice from receiving this medication? Side effects that you should report to your care team as soon as possible: Allergic reactions or angioedema--skin rash, itching or hives, swelling of the face, eyes, lips, tongue, arms, or legs, trouble swallowing or breathing Heart attack--pain or tightness in the chest, shoulders, arms, or jaw, nausea, shortness of breath, cold or clammy skin, feeling faint or lightheaded Mood and behavior changes--anxiety, nervousness, confusion, hallucinations, irritability, hostility, thoughts of suicide or self-harm, worsening mood, feelings of depression Redness, blistering, peeling, or loosening of the skin, including inside the mouth Stroke--sudden numbness or weakness of the face, arm, or leg, trouble speaking, confusion, trouble walking, loss of balance or coordination, dizziness, severe headache, change in vision Seizures Side effects that usually do not require medical attention (report to your care team if they continue or are bothersome): Constipation Drowsiness Gas Nausea Trouble sleeping Upset stomach Vivid dreams or nightmares Vomiting This list may not describe all possible side effects. Call your doctor for medical advice about side effects. You may report side effects to FDA at 1-800-FDA-1088. Where should I keep my medication? Keep out of the reach of children and pets. Store at  room temperature between 15 and 30 degrees C (59 and 86 degrees F). Throw away any unused medication after the expiration date. NOTE: This sheet is a summary. It may not cover all possible information. If you have questions about this medicine, talk to your doctor, pharmacist, or health care provider.  2024 Elsevier/Gold Standard (2021-08-07 00:00:00)   Fatigue If you have fatigue, you feel tired all the time and have a lack of energy or a lack of motivation. Fatigue may make it difficult to start or complete tasks because of exhaustion. Occasional or mild fatigue is often a normal response to activity or life. However, long-term (chronic) or extreme fatigue may be a symptom of a medical condition such as: Depression. Not having enough red blood cells or hemoglobin in the blood (anemia). A problem with a small gland located in the lower front part of the neck (thyroid  disorder). Rheumatologic conditions. These are problems related to the body's defense system (immune system). Infections, especially certain viral infections. Fatigue can also lead to negative health outcomes over time. Follow these instructions at home: Medicines Take over-the-counter and prescription medicines only as told by your health care provider. Take a multivitamin if told by your health care provider. Do not use  herbal or dietary supplements unless they are approved by your health care provider. Eating and drinking  Avoid heavy meals in the evening. Eat a well-balanced diet, which includes lean proteins, whole grains, plenty of fruits and vegetables, and low-fat dairy products. Avoid eating or drinking too many products with caffeine in them. Avoid alcohol. Drink enough fluid to keep your urine pale yellow. Activity  Exercise regularly, as told by your health care provider. Use or practice techniques to help you relax, such as yoga, tai chi, meditation, or massage therapy. Lifestyle Change situations that cause  you stress. Try to keep your work and personal schedules in balance. Do not use recreational or illegal drugs. General instructions Monitor your fatigue for any changes. Go to bed and get up at the same time every day. Avoid fatigue by pacing yourself during the day and getting enough sleep at night. Maintain a healthy weight. Contact a health care provider if: Your fatigue does not get better. You have a fever. You suddenly lose or gain weight. You have headaches. You have trouble falling asleep or sleeping through the night. You feel angry, guilty, anxious, or sad. You have swelling in your legs or another part of your body. Get help right away if: You feel confused, feel like you might faint, or faint. Your vision is blurry or you have a severe headache. You have severe pain in your abdomen, your back, or the area between your waist and hips (pelvis). You have chest pain, shortness of breath, or an irregular or fast heartbeat. You are unable to urinate, or you urinate less than normal. You have abnormal bleeding from the rectum, nose, lungs, nipples, or, if you are female, the vagina. You vomit blood. You have thoughts about hurting yourself or others. These symptoms may be an emergency. Get help right away. Call 911. Do not wait to see if the symptoms will go away. Do not drive yourself to the hospital. Get help right away if you feel like you may hurt yourself or others, or have thoughts about taking your own life. Go to your nearest emergency room or: Call 911. Call the National Suicide Prevention Lifeline at 712-254-2336 or 988. This is open 24 hours a day. Text the Crisis Text Line at 775-689-9732. Summary If you have fatigue, you feel tired all the time and have a lack of energy or a lack of motivation. Fatigue may make it difficult to start or complete tasks because of exhaustion. Long-term (chronic) or extreme fatigue may be a symptom of a medical condition. Exercise  regularly, as told by your health care provider. Change situations that cause you stress. Try to keep your work and personal schedules in balance. This information is not intended to replace advice given to you by your health care provider. Make sure you discuss any questions you have with your health care provider. Document Revised: 07/07/2021 Document Reviewed: 07/07/2021 Elsevier Patient Education  2024 Elsevier Inc.    Signed,   Reyes Pines, MD [facility], [facility] 03/29/24 10:14 AM

## 2024-04-05 ENCOUNTER — Other Ambulatory Visit: Payer: Self-pay | Admitting: Family Medicine

## 2024-04-05 DIAGNOSIS — F32A Depression, unspecified: Secondary | ICD-10-CM

## 2024-04-05 DIAGNOSIS — F3341 Major depressive disorder, recurrent, in partial remission: Secondary | ICD-10-CM

## 2024-05-06 ENCOUNTER — Other Ambulatory Visit: Payer: Self-pay | Admitting: Family Medicine

## 2024-05-06 DIAGNOSIS — G2581 Restless legs syndrome: Secondary | ICD-10-CM

## 2024-06-04 ENCOUNTER — Other Ambulatory Visit: Payer: Self-pay | Admitting: Family Medicine

## 2024-06-04 DIAGNOSIS — F3341 Major depressive disorder, recurrent, in partial remission: Secondary | ICD-10-CM

## 2024-06-04 DIAGNOSIS — F32A Depression, unspecified: Secondary | ICD-10-CM

## 2024-06-05 NOTE — Telephone Encounter (Signed)
Patient has an appointment scheduled for 1/21

## 2024-08-11 ENCOUNTER — Other Ambulatory Visit: Payer: Self-pay | Admitting: Family Medicine

## 2024-08-11 DIAGNOSIS — F32A Depression, unspecified: Secondary | ICD-10-CM

## 2024-08-11 DIAGNOSIS — G2581 Restless legs syndrome: Secondary | ICD-10-CM

## 2024-08-11 DIAGNOSIS — F3341 Major depressive disorder, recurrent, in partial remission: Secondary | ICD-10-CM

## 2024-08-17 LAB — HM MAMMOGRAPHY

## 2024-08-28 ENCOUNTER — Ambulatory Visit: Admitting: Family Medicine

## 2024-08-28 ENCOUNTER — Encounter: Payer: Self-pay | Admitting: Family Medicine

## 2024-08-28 VITALS — BP 120/68 | HR 62 | Temp 97.9°F | Resp 18 | Ht 63.0 in | Wt 166.8 lb

## 2024-08-28 DIAGNOSIS — R059 Cough, unspecified: Secondary | ICD-10-CM | POA: Diagnosis not present

## 2024-08-28 DIAGNOSIS — E782 Mixed hyperlipidemia: Secondary | ICD-10-CM | POA: Diagnosis not present

## 2024-08-28 DIAGNOSIS — G2581 Restless legs syndrome: Secondary | ICD-10-CM

## 2024-08-28 DIAGNOSIS — F419 Anxiety disorder, unspecified: Secondary | ICD-10-CM

## 2024-08-28 DIAGNOSIS — R0982 Postnasal drip: Secondary | ICD-10-CM | POA: Diagnosis not present

## 2024-08-28 DIAGNOSIS — R5383 Other fatigue: Secondary | ICD-10-CM | POA: Diagnosis not present

## 2024-08-28 DIAGNOSIS — F3341 Major depressive disorder, recurrent, in partial remission: Secondary | ICD-10-CM

## 2024-08-28 DIAGNOSIS — R6889 Other general symptoms and signs: Secondary | ICD-10-CM

## 2024-08-28 DIAGNOSIS — Z23 Encounter for immunization: Secondary | ICD-10-CM | POA: Diagnosis not present

## 2024-08-28 DIAGNOSIS — F32A Depression, unspecified: Secondary | ICD-10-CM

## 2024-08-28 LAB — CBC
HCT: 41.3 % (ref 36.0–46.0)
Hemoglobin: 14 g/dL (ref 12.0–15.0)
MCHC: 33.9 g/dL (ref 30.0–36.0)
MCV: 85.1 fl (ref 78.0–100.0)
Platelets: 318 K/uL (ref 150.0–400.0)
RBC: 4.85 Mil/uL (ref 3.87–5.11)
RDW: 13.9 % (ref 11.5–15.5)
WBC: 9.1 K/uL (ref 4.0–10.5)

## 2024-08-28 LAB — TSH: TSH: 2.15 u[IU]/mL (ref 0.35–5.50)

## 2024-08-28 MED ORDER — FLUTICASONE PROPIONATE 50 MCG/ACT NA SUSP: 1.0000 | Freq: Every day | NASAL | 6 refills | Status: AC

## 2024-08-28 MED ORDER — BUSPIRONE HCL 5 MG PO TABS: 5.0000 mg | ORAL_TABLET | Freq: Two times a day (BID) | ORAL | 1 refills | Status: AC | PRN

## 2024-08-28 NOTE — Progress Notes (Signed)
 Subjective:  Patient ID: Rachel Roth, female    DOB: 1966/01/01  Age: 58 y.o. MRN: [id number]  CC:  Chief Complaint  Patient presents with   Hyperlipidemia   Fatigue    Sometimes she is more tired than others. Does not sleep well at night    HPI Rachel Roth presents for   Hyperlipidemia: Had been off statin in the past, restarted Lipitor  at 20 mg in May due to elevated readings, improved on repeat labs in June. Still taking daily, no new side effects. Still some fatigue. Feels cold natured.  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  Echo May 2024 with EF 55 to 60%, normal diastolic parameters.  General fatigue has been discussed previously.  Had been off statin temporarily as above, then restarted and now taking 20 mg/day. She does have a history of tobacco use.  Chantix  discussed at her July visit to help with cessation.  Intermittent cough discussed previously in July and referred to pulmonary.  Chest x-ray was also ordered but not completed.  No chest pains. Slight cough, no dyspnea. No side effects with current meds.  Did not have xray - forgot about it. Less cough since last visit. Min phlegm at times - postnasal drainage. No allergy meds.  Only short of breath with certain activity - more activity outside.  Smoking 1/2-1ppd. Did not try chantix  - not ready to quit at this time.   BP Readings from Last 3 Encounters:  08/28/24 120/68  03/29/24 118/66  02/14/24 116/70   Depression: Treated with Effexor  225 mg daily, BuSpar  5 mg twice  daily as needed, gabapentin  for restless leg syndrome, 300 mg at night.  Symptoms controlled at her May visit.  Still some fatigue, reports some difficulty with sleep. Overall feels like meds working well with buspar  BID. Only rare decreased motivation - once per month.  Usually doing well.  Still taking gabapentin  for RLS nightly - usually well controlled. Always tired. 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  Feeling more cold natured past 6 months or so.  Trouble getting to sleep - not tired until 2am, wakes up usually around 9-9:30am. Sometimes early wakening with work. No treatments.  naps at times.   Flu vaccine today, defers other vaccines.     Lab Results  Component Value Date   TSH 1.51 02/14/2024   Lab Results  Component Value Date   WBC 9.1 02/14/2024   HGB 13.7 02/14/2024   HCT 40.8 02/14/2024   MCV 84.2 02/14/2024   PLT 363.0 02/14/2024        03/29/2024    9:26 AM 12/23/2022    8:41 AM 09/23/2022    9:46 AM 06/18/2022   11:28 AM 07/02/2021    9:16 AM  Depression screen PHQ 2/9  Decreased Interest 0 0 2 0 2  Down, Depressed, Hopeless 0 0 1 0 0  PHQ - 2 Score 0 0 3 0 2  Altered sleeping 1 1 1  0 2  Tired, decreased energy 1  1 2 0 3  Change in appetite 0 0 0 0 0  Feeling bad or failure about yourself  0 0 0 0 0  Trouble concentrating 0 0 0 0 0  Moving slowly or fidgety/restless 0 0 0 0 0  Suicidal thoughts 0 0 0 0 0  PHQ-9 Score 2  2  6   0  7   Difficult doing work/chores Not difficult at all Not difficult at all        Data saved with a previous flowsheet row definition     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   Recurrent major depressive disorder, in partial remission 04/12/2018   Arthritis 10/25/2016   RLS (restless legs  syndrome) 03/19/2015   Lower back pain 07/09/2012   Hip pain 07/09/2012   Cervical radiculopathy 05/29/2012   Anxiety 05/29/2012   Insomnia 05/29/2012   BMI 31.0-31.9,adult 05/29/2012   Depression 12/17/2011   Past Medical History:  Diagnosis Date   Allergy    Arthritis    Hypertension    Past Surgical History:  Procedure Laterality Date   ABDOMINAL HYSTERECTOMY     CARDIAC CATHETERIZATION     CESAREAN SECTION     LEFT HEART CATH AND CORONARY ANGIOGRAPHY N/A 09/09/2022   Procedure: LEFT HEART CATH AND CORONARY ANGIOGRAPHY;  Surgeon: Ladona Heinz, MD;  Location: [facility];  Service: Cardiovascular;  Laterality: N/A;   TOTAL ABDOMINAL HYSTERECTOMY     Allergies  Allergen Reactions   Codeine Nausea And Vomiting   Prior to Admission medications   Medication Sig Start Date End Date Taking? Authorizing Provider  acetaminophen  (TYLENOL ) 325 MG tablet Take 325 mg by mouth every 6 (six) hours as needed for mild pain, fever or headache.   Yes [provider]  amLODipine  (NORVASC ) 5 MG tablet TAKE 1 TABLET (5 MG TOTAL) BY MOUTH DAILY. 02/08/24  Yes Lonni Slain, MD  busPIRone  (BUSPAR ) 5 MG tablet TAKE 1 TABLET BY MOUTH TWICE A DAY AS NEEDED 08/11/24  Yes Levora Reyes SAUNDERS, MD  ENTRESTO  24-26 MG TAKE 1 TABLET BY MOUTH TWICE A DAY 01/10/24  Yes Lonni Slain, MD  gabapentin  (NEURONTIN ) 300 MG capsule TAKE 1-2 CAPSULES (300-600 MG TOTAL) BY MOUTH AT BEDTIME. 08/11/24  Yes Levora Reyes SAUNDERS, MD  isosorbide  mononitrate (IMDUR ) 30 MG 24 hr tablet TAKE 1 TABLET BY MOUTH EVERY DAY 12/13/23  Yes Lonni Slain, MD  metoprolol  tartrate (LOPRESSOR ) 25 MG tablet TAKE 0.5 TABLETS BY MOUTH 2 TIMES DAILY. 01/10/24  Yes Lonni Slain, MD  nitroGLYCERIN  (NITROSTAT ) 0.4 MG SL tablet Place 1 tablet (0.4 mg total) under the tongue every 5 (five) minutes as needed for chest pain. 09/11/22  Yes Claudene Pacific, MD  varenicline  (CHANTIX  CONTINUING MONTH PAK) 1 MG  tablet Take 1 tablet (1 mg total) by mouth 2 (two) times daily. 03/29/24  Yes Levora Reyes SAUNDERS, MD  Varenicline  Tartrate, Starter, (CHANTIX  STARTING MONTH PAK) 0.5 MG X 11 & 1 MG X 42 TBPK Take one 0.5 mg tablet by mouth once daily for 3 days, then increase to one 0.5 mg tablet twice daily for 4 days, then increase to one 1 mg tablet twice daily. 03/29/24  Yes Levora Reyes SAUNDERS, MD  venlafaxine  XR (EFFEXOR -XR) 75 MG 24 hr capsule Take 3 capsules (225  mg total) by mouth daily with breakfast. 02/14/24  Yes Levora Reyes SAUNDERS, MD  atorvastatin  (LIPITOR ) 20 MG tablet TAKE 1 TABLET BY MOUTH EVERY DAY 08/11/24   Levora Reyes SAUNDERS, MD  atorvastatin  (LIPITOR ) 80 MG tablet Take 1 tablet (80 mg total) by mouth daily. Patient not taking: Reported on 08/28/2024 09/12/22   Claudene Pacific, MD   Social History   Socioeconomic History   Marital status: Married    Spouse name: Not on file   Number of children: Not on file   Years of education: Not on file   Highest education level: Not on file  Occupational History   Not on file  Tobacco Use   Smoking status: Every Day    Current packs/day: 0.50    Types: Cigarettes   Smokeless tobacco: Never  Substance and Sexual Activity   Alcohol use: No   Drug use: No   Sexual activity: Yes    Birth control/protection: None  Other Topics Concern   Not on file  Social History Narrative   Not on file   Social Drivers of Health   Financial Resource Strain: Not on file  Food Insecurity: Not on file  Transportation Needs: Not on file  Physical Activity: Not on file  Stress: Not on file  Social Connections: Not on file  Intimate Partner Violence: Not on file    Review of Systems Per HPI.   Objective:   Vitals:   08/28/24 1000  BP: 120/68  Pulse: 62  Resp: 18  Temp: 97.9 F (36.6 C)  TempSrc: Temporal  SpO2: 99%  Weight: 166 lb 12.8 oz (75.7 kg)  Height: 5' 3 (1.6 m)     Physical Exam Vitals reviewed.  Constitutional:      Appearance: Normal  appearance. She is well-developed.  HENT:     Head: Normocephalic and atraumatic.  Eyes:     Conjunctiva/sclera: Conjunctivae normal.     Pupils: Pupils are equal, round, and reactive to light.  Neck:     Vascular: No carotid bruit.  Cardiovascular:     Rate and Rhythm: Normal rate and regular rhythm.     Heart sounds: Normal heart sounds.  Pulmonary:     Effort: Pulmonary effort is normal.     Breath sounds: Normal breath sounds.  Abdominal:     Palpations: Abdomen is soft. There is no pulsatile mass.     Tenderness: There is no abdominal tenderness.  Musculoskeletal:     Right lower leg: No edema.     Left lower leg: No edema.  Skin:    General: Skin is warm and dry.  Neurological:     Mental Status: She is alert and oriented to person, place, and time.  Psychiatric:        Mood and Affect: Mood normal.        Behavior: Behavior normal.        Assessment & Plan:  Rachel Roth is a 58 y.o. female . Mixed hyperlipidemia - Plan: Comprehensive metabolic panel with GFR, Lipid panel  - Check labs.  Tolerating statin.  Less likely causing fatigue but could consider if persistent symptoms after improved sleep and also recommended follow-up with cardiology.  Continue to monitor.  Need for influenza vaccination - Plan: Flu vaccine trivalent PF, 6mos and older(Flulaval,Afluria,Fluarix,Fluzone)  Postnasal drip - Plan: fluticasone  (FLONASE ) 50 MCG/ACT nasal spray Cough, unspecified type - Plan: fluticasone  (FLONASE ) 50 MCG/ACT nasal spray  - History of tobacco use, initial plan for chest x-ray  at last visit, but was not performed.  Lungs clear.  With postnasal drip component I think would be reasonable to try Flonase  initially then if cough persists x-ray and then pulmonary eval.  Recheck in 1 month, RTC precautions given.   Anxiety and depression RLS (restless legs syndrome) Fatigue, unspecified type - Plan: CBC, TSH Cold intolerance  - Reports overall stable anxiety,  depression symptoms with current regimen of Effexor  and BuSpar .  Difficulty with sleep, with late onset sleep, some early awakening for work, other days sleeping in.  Inconsistent sleep regimen may be contributing.  Earlier sleep onset recommended with calm app or other relaxation techniques, handout given on sleep hygiene.  Hold on new meds for now.  Continue gabapentin  for RLS, overall stable.  Recheck in 1 month.  Check labs as above as well for fatigue including CBC and TSH given cold intolerance but stable previously.   Meds ordered this encounter  Medications   fluticasone  (FLONASE ) 50 MCG/ACT nasal spray    Sig: Place 1-2 sprays into both nostrils daily.    Dispense:  16 g    Refill:  6   Patient Instructions  Call cardiology to make sure you are scheduled for follow up in January. Discuss fatigue with cardiology as well.  However fatigue could be related to sleep and variable sleep amounts.  See information below on insomnia and sleep hygiene.  Get to bed earlier each night, with bath, sleepy time tea, melatonin as options to help relax you.  Other apps on the phone such Calm may be helpful or white nose/noise machine may also be helpful.  I will check some labs for the fatigue and cold nature, but can discuss further at follow-up in 1 month.  If any concerns on labs in the meantime or new symptoms please let me know.  For cough, that could be related to postnasal drip.  Try Flonase  nasal spray 1 to 2 sprays in each nostril once per day.  If that does not improve cough, then I think we should have you follow-up with pulmonary and check chest x-ray as previously planned.  Let me know.  Take care!  Insomnia Insomnia is a sleep disorder that makes it difficult to fall asleep or stay asleep. Insomnia can cause fatigue, low energy, difficulty concentrating, mood swings, and poor performance at work or school. There are three different ways to classify insomnia: Difficulty falling  asleep. Difficulty staying asleep. Waking up too early in the morning. Any type of insomnia can be long-term (chronic) or short-term (acute). Both are common. Short-term insomnia usually lasts for 3 months or less. Chronic insomnia occurs at least three times a week for longer than 3 months. What are the causes? Insomnia may be caused by another condition, situation, or substance, such as: Having certain mental health conditions, such as anxiety and depression. Using caffeine, alcohol, tobacco, or drugs. Having gastrointestinal conditions, such as gastroesophageal reflux disease (GERD). Having certain medical conditions. These include: Asthma. Alzheimer's disease. Stroke. Chronic pain. An overactive thyroid  gland (hyperthyroidism). Other sleep disorders, such as restless legs syndrome and sleep apnea. Menopause. Sometimes, the cause of insomnia may not be known. What increases the risk? Risk factors for insomnia include: Gender. Females are affected more often than males. Age. Insomnia is more common as people get older. Stress and certain medical and mental health conditions. Lack of exercise. Having an irregular work schedule. This may include working night shifts and traveling between different time zones. What are the signs  or symptoms? If you have insomnia, the main symptom is having trouble falling asleep or having trouble staying asleep. This may lead to other symptoms, such as: Feeling tired or having low energy. Feeling nervous about going to sleep. Not feeling rested in the morning. Having trouble concentrating. Feeling irritable, anxious, or depressed. How is this diagnosed? This condition may be diagnosed based on: Your symptoms and medical history. Your health care provider may ask about: Your sleep habits. Any medical conditions you have. Your mental health. A physical exam. How is this treated? Treatment for insomnia depends on the cause. Treatment may focus on  treating an underlying condition that is causing the insomnia. Treatment may also include: Medicines to help you sleep. Counseling or therapy. Lifestyle adjustments to help you sleep better. Follow these instructions at home: Eating and drinking  Limit or avoid alcohol, caffeinated beverages, and products that contain nicotine and tobacco, especially close to bedtime. These can disrupt your sleep. Do not eat a large meal or eat spicy foods right before bedtime. This can lead to digestive discomfort that can make it hard for you to sleep. Sleep habits  Keep a sleep diary to help you and your health care provider figure out what could be causing your insomnia. Write down: When you sleep. When you wake up during the night. How well you sleep and how rested you feel the next day. Any side effects of medicines you are taking. What you eat and drink. Make your bedroom a dark, comfortable place where it is easy to fall asleep. Put up shades or blackout curtains to block light from outside. Use a white noise machine to block noise. Keep the temperature cool. Limit screen use before bedtime. This includes: Not watching TV. Not using your smartphone, tablet, or computer. Stick to a routine that includes going to bed and waking up at the same times every day and night. This can help you fall asleep faster. Consider making a quiet activity, such as reading, part of your nighttime routine. Try to avoid taking naps during the day so that you sleep better at night. Get out of bed if you are still awake after 15 minutes of trying to sleep. Keep the lights down, but try reading or doing a quiet activity. When you feel sleepy, go back to bed. General instructions Take over-the-counter and prescription medicines only as told by your health care provider. Exercise regularly as told by your health care provider. However, avoid exercising in the hours right before bedtime. Use relaxation techniques to manage  stress. Ask your health care provider to suggest some techniques that may work well for you. These may include: Breathing exercises. Routines to release muscle tension. Visualizing peaceful scenes. Make sure that you drive carefully. Do not drive if you feel very sleepy. Keep all follow-up visits. This is important. Contact a health care provider if: You are tired throughout the day. You have trouble in your daily routine due to sleepiness. You continue to have sleep problems, or your sleep problems get worse. Get help right away if: You have thoughts about hurting yourself or someone else. Get help right away if you feel like you may hurt yourself or others, or have thoughts about taking your own life. Go to your nearest emergency room or: Call 911. Call the National Suicide Prevention Lifeline at (217)158-4741 or 988. This is open 24 hours a day. Text the Crisis Text Line at (805)544-5979. Summary Insomnia is a sleep disorder that makes it difficult  to fall asleep or stay asleep. Insomnia can be long-term (chronic) or short-term (acute). Treatment for insomnia depends on the cause. Treatment may focus on treating an underlying condition that is causing the insomnia. Keep a sleep diary to help you and your health care provider figure out what could be causing your insomnia. This information is not intended to replace advice given to you by your health care provider. Make sure you discuss any questions you have with your health care provider. Document Revised: 08/25/2021 Document Reviewed: 08/25/2021 Elsevier Patient Education  2024 [employer]    Signed,   Reyes Pines, MD [facility], [facility] 08/28/24 10:57 AM

## 2024-08-28 NOTE — Patient Instructions (Addendum)
 Call cardiology to make sure you are scheduled for follow up in January. Discuss fatigue with cardiology as well.  However fatigue could be related to sleep and variable sleep amounts.  See information below on insomnia and sleep hygiene.  Get to bed earlier each night, with bath, sleepy time tea, melatonin as options to help relax you.  Other apps on the phone such Calm may be helpful or white nose/noise machine may also be helpful.  I will check some labs for the fatigue and cold nature, but can discuss further at follow-up in 1 month.  If any concerns on labs in the meantime or new symptoms please let me know.  For cough, that could be related to postnasal drip.  Try Flonase  nasal spray 1 to 2 sprays in each nostril once per day.  If that does not improve cough, then I think we should have you follow-up with pulmonary and check chest x-ray as previously planned.  Let me know.  Take care!  Insomnia Insomnia is a sleep disorder that makes it difficult to fall asleep or stay asleep. Insomnia can cause fatigue, low energy, difficulty concentrating, mood swings, and poor performance at work or school. There are three different ways to classify insomnia: Difficulty falling asleep. Difficulty staying asleep. Waking up too early in the morning. Any type of insomnia can be long-term (chronic) or short-term (acute). Both are common. Short-term insomnia usually lasts for 3 months or less. Chronic insomnia occurs at least three times a week for longer than 3 months. What are the causes? Insomnia may be caused by another condition, situation, or substance, such as: Having certain mental health conditions, such as anxiety and depression. Using caffeine, alcohol, tobacco, or drugs. Having gastrointestinal conditions, such as gastroesophageal reflux disease (GERD). Having certain medical conditions. These include: Asthma. Alzheimer's disease. Stroke. Chronic pain. An overactive thyroid  gland  (hyperthyroidism). Other sleep disorders, such as restless legs syndrome and sleep apnea. Menopause. Sometimes, the cause of insomnia may not be known. What increases the risk? Risk factors for insomnia include: Gender. Females are affected more often than males. Age. Insomnia is more common as people get older. Stress and certain medical and mental health conditions. Lack of exercise. Having an irregular work schedule. This may include working night shifts and traveling between different time zones. What are the signs or symptoms? If you have insomnia, the main symptom is having trouble falling asleep or having trouble staying asleep. This may lead to other symptoms, such as: Feeling tired or having low energy. Feeling nervous about going to sleep. Not feeling rested in the morning. Having trouble concentrating. Feeling irritable, anxious, or depressed. How is this diagnosed? This condition may be diagnosed based on: Your symptoms and medical history. Your health care provider may ask about: Your sleep habits. Any medical conditions you have. Your mental health. A physical exam. How is this treated? Treatment for insomnia depends on the cause. Treatment may focus on treating an underlying condition that is causing the insomnia. Treatment may also include: Medicines to help you sleep. Counseling or therapy. Lifestyle adjustments to help you sleep better. Follow these instructions at home: Eating and drinking  Limit or avoid alcohol, caffeinated beverages, and products that contain nicotine and tobacco, especially close to bedtime. These can disrupt your sleep. Do not eat a large meal or eat spicy foods right before bedtime. This can lead to digestive discomfort that can make it hard for you to sleep. Sleep habits  Keep a sleep diary  to help you and your health care provider figure out what could be causing your insomnia. Write down: When you sleep. When you wake up during the  night. How well you sleep and how rested you feel the next day. Any side effects of medicines you are taking. What you eat and drink. Make your bedroom a dark, comfortable place where it is easy to fall asleep. Put up shades or blackout curtains to block light from outside. Use a white noise machine to block noise. Keep the temperature cool. Limit screen use before bedtime. This includes: Not watching TV. Not using your smartphone, tablet, or computer. Stick to a routine that includes going to bed and waking up at the same times every day and night. This can help you fall asleep faster. Consider making a quiet activity, such as reading, part of your nighttime routine. Try to avoid taking naps during the day so that you sleep better at night. Get out of bed if you are still awake after 15 minutes of trying to sleep. Keep the lights down, but try reading or doing a quiet activity. When you feel sleepy, go back to bed. General instructions Take over-the-counter and prescription medicines only as told by your health care provider. Exercise regularly as told by your health care provider. However, avoid exercising in the hours right before bedtime. Use relaxation techniques to manage stress. Ask your health care provider to suggest some techniques that may work well for you. These may include: Breathing exercises. Routines to release muscle tension. Visualizing peaceful scenes. Make sure that you drive carefully. Do not drive if you feel very sleepy. Keep all follow-up visits. This is important. Contact a health care provider if: You are tired throughout the day. You have trouble in your daily routine due to sleepiness. You continue to have sleep problems, or your sleep problems get worse. Get help right away if: You have thoughts about hurting yourself or someone else. Get help right away if you feel like you may hurt yourself or others, or have thoughts about taking your own life. Go to your  nearest emergency room or: Call 911. Call the National Suicide Prevention Lifeline at 239-426-5940 or 988. This is open 24 hours a day. Text the Crisis Text Line at (352) 620-7574. Summary Insomnia is a sleep disorder that makes it difficult to fall asleep or stay asleep. Insomnia can be long-term (chronic) or short-term (acute). Treatment for insomnia depends on the cause. Treatment may focus on treating an underlying condition that is causing the insomnia. Keep a sleep diary to help you and your health care provider figure out what could be causing your insomnia. This information is not intended to replace advice given to you by your health care provider. Make sure you discuss any questions you have with your health care provider. Document Revised: 08/25/2021 Document Reviewed: 08/25/2021 Elsevier Patient Education  2024 [employer].

## 2024-08-29 ENCOUNTER — Ambulatory Visit: Payer: Self-pay | Admitting: Family Medicine

## 2024-08-29 DIAGNOSIS — E876 Hypokalemia: Secondary | ICD-10-CM

## 2024-08-29 LAB — COMPREHENSIVE METABOLIC PANEL WITH GFR
ALT: 10 U/L (ref 0–35)
AST: 12 U/L (ref 0–37)
Albumin: 4.3 g/dL (ref 3.5–5.2)
Alkaline Phosphatase: 111 U/L (ref 39–117)
BUN: 9 mg/dL (ref 6–23)
CO2: 30 meq/L (ref 19–32)
Calcium: 9.2 mg/dL (ref 8.4–10.5)
Chloride: 104 meq/L (ref 96–112)
Creatinine, Ser: 0.62 mg/dL (ref 0.40–1.20)
GFR: 98.63 mL/min (ref >60.00)
Glucose, Bld: 77 mg/dL (ref 70–99)
Potassium: 3.4 meq/L — ABNORMAL LOW (ref 3.5–5.1)
Sodium: 143 meq/L (ref 135–145)
Total Bilirubin: 0.3 mg/dL (ref 0.2–1.2)
Total Protein: 6.7 g/dL (ref 6.0–8.3)

## 2024-08-29 LAB — LIPID PANEL
Cholesterol: 131 mg/dL (ref 0–200)
HDL: 40.7 mg/dL
LDL Cholesterol: 68 mg/dL (ref 0–99)
NonHDL: 90.01
Total CHOL/HDL Ratio: 3
Triglycerides: 111 mg/dL (ref 0.0–149.0)
VLDL: 22.2 mg/dL (ref 0.0–40.0)

## 2024-08-30 NOTE — Progress Notes (Signed)
 Called patient and unable to leave vm due to mailbox being full. Future labs ordered. Patient needs to schedule lab visit in 3-4 weeks

## 2024-09-06 ENCOUNTER — Encounter (HOSPITAL_BASED_OUTPATIENT_CLINIC_OR_DEPARTMENT_OTHER): Payer: Self-pay | Admitting: Cardiology

## 2024-09-12 ENCOUNTER — Other Ambulatory Visit (INDEPENDENT_AMBULATORY_CARE_PROVIDER_SITE_OTHER)

## 2024-09-12 DIAGNOSIS — E876 Hypokalemia: Secondary | ICD-10-CM

## 2024-09-12 LAB — BASIC METABOLIC PANEL WITH GFR
BUN: 7 mg/dL (ref 6–23)
CO2: 30 meq/L (ref 19–32)
Calcium: 9.1 mg/dL (ref 8.4–10.5)
Chloride: 104 meq/L (ref 96–112)
Creatinine, Ser: 0.62 mg/dL (ref 0.40–1.20)
GFR: 98.6 mL/min (ref >60.00)
Glucose, Bld: 110 mg/dL — ABNORMAL HIGH (ref 70–99)
Potassium: 3.5 meq/L (ref 3.5–5.1)
Sodium: 141 meq/L (ref 135–145)

## 2024-09-15 ENCOUNTER — Ambulatory Visit: Payer: Self-pay | Admitting: Family Medicine

## 2024-09-25 NOTE — Progress Notes (Signed)
 Lab results have been discussed.   Verbalized understanding? Yes  Are there any questions? No

## 2024-10-02 ENCOUNTER — Ambulatory Visit: Admitting: Family Medicine

## 2024-10-02 ENCOUNTER — Encounter: Payer: Self-pay | Admitting: Family Medicine

## 2024-10-02 VITALS — BP 120/72 | HR 65 | Temp 98.1°F | Resp 18 | Ht 63.0 in | Wt 169.2 lb

## 2024-10-02 DIAGNOSIS — R5383 Other fatigue: Secondary | ICD-10-CM

## 2024-10-02 DIAGNOSIS — R0982 Postnasal drip: Secondary | ICD-10-CM | POA: Diagnosis not present

## 2024-10-02 DIAGNOSIS — G2581 Restless legs syndrome: Secondary | ICD-10-CM | POA: Diagnosis not present

## 2024-10-02 DIAGNOSIS — G4700 Insomnia, unspecified: Secondary | ICD-10-CM

## 2024-10-02 DIAGNOSIS — R059 Cough, unspecified: Secondary | ICD-10-CM | POA: Diagnosis not present

## 2024-10-02 MED ORDER — HYDROXYZINE HCL 25 MG PO TABS: 12.5000 mg | ORAL_TABLET | Freq: Every evening | ORAL | 1 refills | Status: AC | PRN

## 2024-10-02 NOTE — Patient Instructions (Addendum)
 Glad to hear the cough is better, continue flonase . Since cough has not completely resolved please have the xray that was ordered previously  - ok to walk in to location below, you do not need an appointment:  [facility] or xray: Walk in 8:30-4:30 during weekdays, no appointment needed [address]  [place], [place] [address]  For sleep - it is important to get on a better schedule. You can try taking 2 gabapentin  to see if that helps with sleep and leg symptoms, and you may want to try taking that earlier in the evening.  Initial goal is to try to get your bedtime closer to midnight.  Ultimately getting bedtime closer to 9 to 10 PM would be ideal so that you are able to go to sleep earlier on the nights that you are having to wake up earlier.  If gabapentin  at higher dose does not help sleep, then return to 1 pill and you can try hydroxyzine  initially 1/2 pill 30 minutes or so before bedtime.  If that is not effective can increase to a full pill of hydroxyzine  before bed to help you get to sleep.  See information below on sleep hygiene or other ways to help with healthy sleep and follow-up in 6 weeks, sooner if any new or worsening symptoms.  Insomnia Insomnia is a sleep disorder that makes it difficult to fall asleep or stay asleep. Insomnia can cause fatigue, low energy, difficulty concentrating, mood swings, and poor performance at work or school. There are three different ways to classify insomnia: Difficulty falling asleep. Difficulty staying asleep. Waking up too early in the morning. Any type of insomnia can be long-term (chronic) or short-term (acute). Both are common. Short-term insomnia usually lasts for 3 months or less. Chronic insomnia occurs at least three times a week for longer than 3 months. What are the causes? Insomnia may be caused by another condition, situation, or substance, such as: Having certain mental health conditions, such as anxiety and depression. Using caffeine,  alcohol, tobacco, or drugs. Having gastrointestinal conditions, such as gastroesophageal reflux disease (GERD). Having certain medical conditions. These include: Asthma. Alzheimer's disease. Stroke. Chronic pain. An overactive thyroid  gland (hyperthyroidism). Other sleep disorders, such as restless legs syndrome and sleep apnea. Menopause. Sometimes, the cause of insomnia may not be known. What increases the risk? Risk factors for insomnia include: Gender. Females are affected more often than males. Age. Insomnia is more common as people get older. Stress and certain medical and mental health conditions. Lack of exercise. Having an irregular work schedule. This may include working night shifts and traveling between different time zones. What are the signs or symptoms? If you have insomnia, the main symptom is having trouble falling asleep or having trouble staying asleep. This may lead to other symptoms, such as: Feeling tired or having low energy. Feeling nervous about going to sleep. Not feeling rested in the morning. Having trouble concentrating. Feeling irritable, anxious, or depressed. How is this diagnosed? This condition may be diagnosed based on: Your symptoms and medical history. Your health care provider may ask about: Your sleep habits. Any medical conditions you have. Your mental health. A physical exam. How is this treated? Treatment for insomnia depends on the cause. Treatment may focus on treating an underlying condition that is causing the insomnia. Treatment may also include: Medicines to help you sleep. Counseling or therapy. Lifestyle adjustments to help you sleep better. Follow these instructions at home: Eating and drinking  Limit or  avoid alcohol, caffeinated beverages, and products that contain nicotine and tobacco, especially close to bedtime. These can disrupt your sleep. Do not eat a large meal or eat spicy foods right before bedtime. This can lead  to digestive discomfort that can make it hard for you to sleep. Sleep habits  Keep a sleep diary to help you and your health care provider figure out what could be causing your insomnia. Write down: When you sleep. When you wake up during the night. How well you sleep and how rested you feel the next day. Any side effects of medicines you are taking. What you eat and drink. Make your bedroom a dark, comfortable place where it is easy to fall asleep. Put up shades or blackout curtains to block light from outside. Use a white noise machine to block noise. Keep the temperature cool. Limit screen use before bedtime. This includes: Not watching TV. Not using your smartphone, tablet, or computer. Stick to a routine that includes going to bed and waking up at the same times every day and night. This can help you fall asleep faster. Consider making a quiet activity, such as reading, part of your nighttime routine. Try to avoid taking naps during the day so that you sleep better at night. Get out of bed if you are still awake after 15 minutes of trying to sleep. Keep the lights down, but try reading or doing a quiet activity. When you feel sleepy, go back to bed. General instructions Take over-the-counter and prescription medicines only as told by your health care provider. Exercise regularly as told by your health care provider. However, avoid exercising in the hours right before bedtime. Use relaxation techniques to manage stress. Ask your health care provider to suggest some techniques that may work well for you. These may include: Breathing exercises. Routines to release muscle tension. Visualizing peaceful scenes. Make sure that you drive carefully. Do not drive if you feel very sleepy. Keep all follow-up visits. This is important. Contact a health care provider if: You are tired throughout the day. You have trouble in your daily routine due to sleepiness. You continue to have sleep  problems, or your sleep problems get worse. Get help right away if: You have thoughts about hurting yourself or someone else. Get help right away if you feel like you may hurt yourself or others, or have thoughts about taking your own life. Go to your nearest emergency room or: Call 911. Call the National Suicide Prevention Lifeline at (607)613-8384 or 988. This is open 24 hours a day. Text the Crisis Text Line at 504-561-4170. Summary Insomnia is a sleep disorder that makes it difficult to fall asleep or stay asleep. Insomnia can be long-term (chronic) or short-term (acute). Treatment for insomnia depends on the cause. Treatment may focus on treating an underlying condition that is causing the insomnia. Keep a sleep diary to help you and your health care provider figure out what could be causing your insomnia. This information is not intended to replace advice given to you by your health care provider. Make sure you discuss any questions you have with your health care provider. Document Revised: 08/25/2021 Document Reviewed: 08/25/2021 Elsevier Patient Education  2024 [employer].

## 2024-10-02 NOTE — Progress Notes (Signed)
 "  Subjective:  Patient ID: Rachel Roth, female    DOB: [birth year]  Age: 59 y.o. MRN: [id number]  CC:  Chief Complaint  Patient presents with   Follow-up    1 month follow up on fatigue, sleep and cough. Cough is getting better. Sleep is still an issue. Some nights are harder than others. She goes to bed at 2/2:20am. Getting an average of 3-4 hours per night.     HPI Rachel Roth presents for   Follow-up from December visit  Fatigue Discussed at December visit.  Report overall stable anxiety and depression symptoms with BuSpar  and Effexor .  Was having difficulty with sleep, late onset sleep, early awakening for work and other days sleeping in.  The inconsistent sleep regimen was thought to be contributing to her fatigue.  I did recommend earlier sleep onset along with an appetite or other relaxation techniques and handout given on sleep hygiene.  She was on gabapentin  for RLS, no additional meds given. Still having difficulty with sleep, going to bed at 2 morning and sleeping 3 to 4 hours on weekends when working - at work at [employer].  Rachel Roth on weekends - 6am -1pm shift, then during week 1pm to 8pm.  Only works 3 days per week.  On late work days - sleeping into 10am.  Unable to go to sleep earlier.  Taking gabapentin  at 8-830pm with some relief. Still some restless legs at times. Worse off meds. Has not tried 2 pills.  No change in sleep routine.    Cough Also discussed member.  Suspected postnasal drip.  Lungs are clear, but with tobacco history we did discuss option of chest x-ray if not improving with Flonase  as well as possible pulmonary eval.  Since last visit cough has improved. Flonase  has helped. No dyspnea/fever. Only minimal in am, or later in day.   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   Recurrent major depressive disorder, in partial remission 04/12/2018   Arthritis 10/25/2016   RLS (restless legs syndrome) 03/19/2015   Lower back pain 07/09/2012   Hip pain 07/09/2012   Cervical radiculopathy 05/29/2012   Anxiety 05/29/2012   Insomnia 05/29/2012   BMI 31.0-31.9,adult 05/29/2012   Depression 12/17/2011   Past Medical History:  Diagnosis Date   Allergy    Arthritis    Hypertension    Past Surgical History:  Procedure Laterality Date   ABDOMINAL HYSTERECTOMY     CARDIAC CATHETERIZATION     CESAREAN SECTION     LEFT HEART CATH AND CORONARY ANGIOGRAPHY N/A 09/09/2022   Procedure: LEFT HEART CATH AND CORONARY ANGIOGRAPHY;  Surgeon: Ladona Heinz, MD;  Location: [facility];  Service: Cardiovascular;  Laterality: N/A;   TOTAL ABDOMINAL HYSTERECTOMY     Allergies[1] Prior to Admission medications  Medication Sig Start Date End Date Taking? Authorizing Provider  acetaminophen  (TYLENOL ) 325 MG tablet Take 325 mg by mouth every 6 (six) hours as needed for mild pain, fever or headache.   Yes [provider]  amLODipine  (NORVASC ) 5 MG tablet TAKE 1 TABLET (5 MG TOTAL) BY MOUTH DAILY. 02/08/24  Yes Lonni Slain, MD  atorvastatin  (LIPITOR ) 20 MG tablet TAKE 1 TABLET BY MOUTH EVERY DAY 08/11/24  Yes Levora Reyes SAUNDERS, MD  busPIRone  (BUSPAR ) 5 MG  tablet Take 1 tablet (5 mg total) by mouth 2 (two) times daily as needed. 08/28/24  Yes Levora Reyes SAUNDERS, MD  ENTRESTO  24-26 MG TAKE 1 TABLET BY MOUTH TWICE A DAY 01/10/24  Yes Lonni Slain, MD  fluticasone  (FLONASE ) 50 MCG/ACT nasal spray Place 1-2 sprays into both nostrils daily. 08/28/24  Yes Levora Reyes SAUNDERS, MD  gabapentin  (NEURONTIN ) 300 MG capsule TAKE 1-2 CAPSULES (300-600 MG TOTAL) BY MOUTH AT BEDTIME. 08/11/24  Yes Levora Reyes SAUNDERS, MD  isosorbide  mononitrate (IMDUR ) 30 MG 24 hr tablet TAKE 1 TABLET BY MOUTH EVERY DAY 12/13/23  Yes Lonni Slain, MD   metoprolol  tartrate (LOPRESSOR ) 25 MG tablet TAKE 0.5 TABLETS BY MOUTH 2 TIMES DAILY. 01/10/24  Yes Lonni Slain, MD  nitroGLYCERIN  (NITROSTAT ) 0.4 MG SL tablet Place 1 tablet (0.4 mg total) under the tongue every 5 (five) minutes as needed for chest pain. 09/11/22  Yes Claudene Pacific, MD  venlafaxine  XR (EFFEXOR -XR) 75 MG 24 hr capsule Take 3 capsules (225 mg total) by mouth daily with breakfast. 02/14/24  Yes Levora Reyes SAUNDERS, MD  varenicline  (CHANTIX  CONTINUING MONTH PAK) 1 MG tablet Take 1 tablet (1 mg total) by mouth 2 (two) times daily. Patient not taking: Reported on 10/02/2024 03/29/24   Levora Reyes SAUNDERS, MD  Varenicline  Tartrate, Starter, (CHANTIX  STARTING MONTH PAK) 0.5 MG X 11 & 1 MG X 42 TBPK Take one 0.5 mg tablet by mouth once daily for 3 days, then increase to one 0.5 mg tablet twice daily for 4 days, then increase to one 1 mg tablet twice daily. Patient not taking: Reported on 10/02/2024 03/29/24   Levora Reyes SAUNDERS, MD   Social History   Socioeconomic History   Marital status: Married    Spouse name: Not on file   Number of children: Not on file   Years of education: Not on file   Highest education level: Not on file  Occupational History   Not on file  Tobacco Use   Smoking status: Every Day    Current packs/day: 0.50    Types: Cigarettes   Smokeless tobacco: Never  Substance and Sexual Activity   Alcohol use: No   Drug use: No   Sexual activity: Yes    Birth control/protection: None  Other Topics Concern   Not on file  Social History Narrative   Not on file   Social Drivers of Health   Tobacco Use: High Risk (10/02/2024)   Patient History    Smoking Tobacco Use: Every Day    Smokeless Tobacco Use: Never    Passive Exposure: Not on file  Financial Resource Strain: Not on file  Food Insecurity: Not on file  Transportation Needs: Not on file  Physical Activity: Not on file  Stress: Not on file  Social Connections: Not on file  Intimate Partner Violence:  Not on file  Depression (PHQ2-9): Medium Risk (10/02/2024)   Depression (PHQ2-9)    PHQ-2 Score: 7  Alcohol Screen: Not on file  Housing: Not on file  Utilities: Not on file  Health Literacy: Not on file    Review of Systems   Objective:   Vitals:   10/02/24 1052  BP: 120/72  Pulse: 65  Resp: 18  Temp: 98.1 F (36.7 C)  TempSrc: Temporal  SpO2: 98%  Weight: 169 lb 3.2 oz (76.7 kg)  Height: 5' 3 (1.6 m)     Physical Exam Vitals reviewed.  Constitutional:      Appearance: Normal appearance. She is well-developed.  HENT:     Head: Normocephalic and atraumatic.  Eyes:     Conjunctiva/sclera: Conjunctivae normal.     Pupils: Pupils are equal, round, and reactive to light.  Neck:     Vascular: No carotid bruit.  Cardiovascular:     Rate and Rhythm: Normal rate and regular rhythm.     Heart sounds: Normal heart sounds.  Pulmonary:     Effort: Pulmonary effort is normal.     Breath sounds: Normal breath sounds.  Abdominal:     Palpations: Abdomen is soft. There is no pulsatile mass.     Tenderness: There is no abdominal tenderness.  Musculoskeletal:     Right lower leg: No edema.     Left lower leg: No edema.  Skin:    General: Skin is warm and dry.  Neurological:     Mental Status: She is alert and oriented to person, place, and time.  Psychiatric:        Mood and Affect: Mood normal.        Behavior: Behavior normal.        Assessment & Plan:  Rachel Roth is a 59 y.o. female . RLS (restless legs syndrome) Insomnia, unspecified type - Plan: hydrOXYzine  (ATARAX ) 25 MG tablet Fatigue, unspecified type - Plan: hydrOXYzine  (ATARAX ) 25 MG tablet  - I suspect the fatigue is still due to incomplete sleep, disordered sleep as above.  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  Stressed importance of consistent bedtimes, earlier bedtimes to help with sleep hygiene.  Can try 2 of her gabapentin  at bedtime in  case some of her symptoms may be due to incomplete treatment of RLS.  Could consider different med for RLS but will start with slight higher dose of gabapentin  initially.  If that is not effective, then return to 1 gabapentin , and can see, lowest effective dose with potential side effects discussed.  Stressed importance of better sleep hygiene and handout given again.  Recheck in 6 weeks, sooner if worse.   Cough, unspecified type Postnasal drip  - Cough has improved, lungs clear on exam.  Postnasal drip likely primary contributor but with tobacco use history and still some intermittent cough chest x-ray recommended, has been ordered previously.  Address provided.  6-week follow-up.  If cough continues next few months, may need pulmonary eval.  Meds ordered this encounter  Medications   hydrOXYzine  (ATARAX ) 25 MG tablet    Sig: Take 0.5-1 tablets (12.5-25 mg total) by mouth at bedtime as needed for anxiety.    Dispense:  30 tablet    Refill:  1   Patient Instructions  Glad to hear the cough is better, continue flonase . Since cough has not completely resolved please have the xray that was ordered previously  - ok to walk in to location below, you do not need an appointment:  [facility] or xray: Walk in 8:30-4:30 during weekdays, no appointment needed [address]  [place], [place] [address]  For sleep - it is important to get on a better schedule. You can try taking 2 gabapentin  to see if that helps with sleep and leg symptoms, and you may want to try taking that earlier in the evening.  Initial goal is to try to get your bedtime closer to midnight.  Ultimately getting bedtime closer to 9 to 10 PM would be ideal so that you are able to go to sleep earlier on the nights that  you are having to wake up earlier.  If gabapentin  at higher dose does not help sleep, then return to 1 pill and you can try hydroxyzine  initially 1/2 pill 30 minutes or so before bedtime.  If that is not effective can  increase to a full pill of hydroxyzine  before bed to help you get to sleep.  See information below on sleep hygiene or other ways to help with healthy sleep and follow-up in 6 weeks, sooner if any new or worsening symptoms.  Insomnia Insomnia is a sleep disorder that makes it difficult to fall asleep or stay asleep. Insomnia can cause fatigue, low energy, difficulty concentrating, mood swings, and poor performance at work or school. There are three different ways to classify insomnia: Difficulty falling asleep. Difficulty staying asleep. Waking up too early in the morning. Any type of insomnia can be long-term (chronic) or short-term (acute). Both are common. Short-term insomnia usually lasts for 3 months or less. Chronic insomnia occurs at least three times a week for longer than 3 months. What are the causes? Insomnia may be caused by another condition, situation, or substance, such as: Having certain mental health conditions, such as anxiety and depression. Using caffeine, alcohol, tobacco, or drugs. Having gastrointestinal conditions, such as gastroesophageal reflux disease (GERD). Having certain medical conditions. These include: Asthma. Alzheimer's disease. Stroke. Chronic pain. An overactive thyroid  gland (hyperthyroidism). Other sleep disorders, such as restless legs syndrome and sleep apnea. Menopause. Sometimes, the cause of insomnia may not be known. What increases the risk? Risk factors for insomnia include: Gender. Females are affected more often than males. Age. Insomnia is more common as people get older. Stress and certain medical and mental health conditions. Lack of exercise. Having an irregular work schedule. This may include working night shifts and traveling between different time zones. What are the signs or symptoms? If you have insomnia, the main symptom is having trouble falling asleep or having trouble staying asleep. This may lead to other symptoms, such  as: Feeling tired or having low energy. Feeling nervous about going to sleep. Not feeling rested in the morning. Having trouble concentrating. Feeling irritable, anxious, or depressed. How is this diagnosed? This condition may be diagnosed based on: Your symptoms and medical history. Your health care provider may ask about: Your sleep habits. Any medical conditions you have. Your mental health. A physical exam. How is this treated? Treatment for insomnia depends on the cause. Treatment may focus on treating an underlying condition that is causing the insomnia. Treatment may also include: Medicines to help you sleep. Counseling or therapy. Lifestyle adjustments to help you sleep better. Follow these instructions at home: Eating and drinking  Limit or avoid alcohol, caffeinated beverages, and products that contain nicotine and tobacco, especially close to bedtime. These can disrupt your sleep. Do not eat a large meal or eat spicy foods right before bedtime. This can lead to digestive discomfort that can make it hard for you to sleep. Sleep habits  Keep a sleep diary to help you and your health care provider figure out what could be causing your insomnia. Write down: When you sleep. When you wake up during the night. How well you sleep and how rested you feel the next day. Any side effects of medicines you are taking. What you eat and drink. Make your bedroom a dark, comfortable place where it is easy to fall asleep. Put up shades or blackout curtains to block light from outside. Use a white noise machine to block  noise. Keep the temperature cool. Limit screen use before bedtime. This includes: Not watching TV. Not using your smartphone, tablet, or computer. Stick to a routine that includes going to bed and waking up at the same times every day and night. This can help you fall asleep faster. Consider making a quiet activity, such as reading, part of your nighttime routine. Try to  avoid taking naps during the day so that you sleep better at night. Get out of bed if you are still awake after 15 minutes of trying to sleep. Keep the lights down, but try reading or doing a quiet activity. When you feel sleepy, go back to bed. General instructions Take over-the-counter and prescription medicines only as told by your health care provider. Exercise regularly as told by your health care provider. However, avoid exercising in the hours right before bedtime. Use relaxation techniques to manage stress. Ask your health care provider to suggest some techniques that may work well for you. These may include: Breathing exercises. Routines to release muscle tension. Visualizing peaceful scenes. Make sure that you drive carefully. Do not drive if you feel very sleepy. Keep all follow-up visits. This is important. Contact a health care provider if: You are tired throughout the day. You have trouble in your daily routine due to sleepiness. You continue to have sleep problems, or your sleep problems get worse. Get help right away if: You have thoughts about hurting yourself or someone else. Get help right away if you feel like you may hurt yourself or others, or have thoughts about taking your own life. Go to your nearest emergency room or: Call 911. Call the National Suicide Prevention Lifeline at (808)009-4070 or 988. This is open 24 hours a day. Text the Crisis Text Line at 775-129-4448. Summary Insomnia is a sleep disorder that makes it difficult to fall asleep or stay asleep. Insomnia can be long-term (chronic) or short-term (acute). Treatment for insomnia depends on the cause. Treatment may focus on treating an underlying condition that is causing the insomnia. Keep a sleep diary to help you and your health care provider figure out what could be causing your insomnia. This information is not intended to replace advice given to you by your health care provider. Make sure you discuss any  questions you have with your health care provider. Document Revised: 08/25/2021 Document Reviewed: 08/25/2021 Elsevier Patient Education  2024 [employer]         Signed,   Reyes Pines, MD [facility], [facility] 10/02/2024 11:47 AM      [1]  Allergies Allergen Reactions   Codeine Nausea And Vomiting   "

## 2024-10-24 ENCOUNTER — Other Ambulatory Visit: Payer: Self-pay | Admitting: Family Medicine

## 2024-10-24 DIAGNOSIS — G4700 Insomnia, unspecified: Secondary | ICD-10-CM

## 2024-10-24 DIAGNOSIS — R5383 Other fatigue: Secondary | ICD-10-CM

## 2024-11-13 ENCOUNTER — Ambulatory Visit: Admitting: Family Medicine
